# Patient Record
Sex: Female | Born: 1950 | Race: White | Hispanic: No | Smoking: Never smoker
Health system: Southern US, Community
[De-identification: ages and names within clinical notes are randomized; demographics above are authoritative.]

## PROBLEM LIST (undated history)

## (undated) DIAGNOSIS — G2581 Restless legs syndrome: Secondary | ICD-10-CM

## (undated) DIAGNOSIS — F419 Anxiety disorder, unspecified: Secondary | ICD-10-CM

## (undated) DIAGNOSIS — B353 Tinea pedis: Secondary | ICD-10-CM

## (undated) DIAGNOSIS — I8390 Asymptomatic varicose veins of unspecified lower extremity: Secondary | ICD-10-CM

## (undated) DIAGNOSIS — E782 Mixed hyperlipidemia: Secondary | ICD-10-CM

## (undated) DIAGNOSIS — E119 Type 2 diabetes mellitus without complications: Secondary | ICD-10-CM

## (undated) DIAGNOSIS — R569 Unspecified convulsions: Secondary | ICD-10-CM

## (undated) DIAGNOSIS — F339 Major depressive disorder, recurrent, unspecified: Secondary | ICD-10-CM

## (undated) DIAGNOSIS — K635 Polyp of colon: Secondary | ICD-10-CM

## (undated) DIAGNOSIS — G4700 Insomnia, unspecified: Secondary | ICD-10-CM

## (undated) HISTORY — DX: Tinea pedis: B35.3

## (undated) HISTORY — DX: Anxiety disorder, unspecified: F41.9

## (undated) HISTORY — DX: Major depressive disorder, recurrent, unspecified: F33.9

## (undated) HISTORY — DX: Mixed hyperlipidemia: E78.2

## (undated) HISTORY — DX: Restless legs syndrome: G25.81

## (undated) HISTORY — DX: Polyp of colon: K63.5

## (undated) HISTORY — DX: Insomnia, unspecified: G47.00

## (undated) HISTORY — DX: Asymptomatic varicose veins of unspecified lower extremity: I83.90

## (undated) HISTORY — PX: OTHER SURGICAL HISTORY: SHX169

## (undated) HISTORY — DX: Type 2 diabetes mellitus without complications: E11.9

## (undated) HISTORY — DX: Unspecified convulsions: R56.9

---

## 2008-11-24 LAB — HM MAMMOGRAPHY: HM Mammogram: NORMAL

## 2009-11-24 LAB — HM PAP SMEAR: HM Pap smear: NORMAL

## 2010-11-19 ENCOUNTER — Emergency Department: Payer: Self-pay | Admitting: Emergency Medicine

## 2012-05-30 ENCOUNTER — Emergency Department: Payer: Self-pay | Admitting: Emergency Medicine

## 2012-08-14 DIAGNOSIS — E785 Hyperlipidemia, unspecified: Secondary | ICD-10-CM | POA: Insufficient documentation

## 2012-11-13 DIAGNOSIS — E559 Vitamin D deficiency, unspecified: Secondary | ICD-10-CM | POA: Insufficient documentation

## 2012-11-29 DIAGNOSIS — K635 Polyp of colon: Secondary | ICD-10-CM

## 2012-11-29 HISTORY — PX: COLONOSCOPY: SHX5424

## 2012-11-29 HISTORY — DX: Polyp of colon: K63.5

## 2013-04-07 ENCOUNTER — Emergency Department: Payer: Self-pay | Admitting: Emergency Medicine

## 2013-04-07 LAB — PHENYTOIN LEVEL, TOTAL: Dilantin: 28.5 ug/mL — ABNORMAL HIGH (ref 10.0–20.0)

## 2013-04-07 LAB — URINALYSIS, COMPLETE
Bilirubin,UR: NEGATIVE
Blood: NEGATIVE
Glucose,UR: NEGATIVE mg/dL (ref 0–75)
Ketone: NEGATIVE
Nitrite: NEGATIVE
Ph: 6 (ref 4.5–8.0)
Protein: NEGATIVE
RBC,UR: 3 /HPF (ref 0–5)
Specific Gravity: 1.013 (ref 1.003–1.030)
Squamous Epithelial: 1
WBC UR: 2 /HPF (ref 0–5)

## 2013-04-07 LAB — COMPREHENSIVE METABOLIC PANEL
Alkaline Phosphatase: 154 U/L — ABNORMAL HIGH (ref 50–136)
BUN: 17 mg/dL (ref 7–18)
Bilirubin,Total: 0.2 mg/dL (ref 0.2–1.0)
Calcium, Total: 8.6 mg/dL (ref 8.5–10.1)
Creatinine: 0.71 mg/dL (ref 0.60–1.30)
EGFR (African American): >60
EGFR (Non-African Amer.): >60
Glucose: 119 mg/dL — ABNORMAL HIGH (ref 65–99)
Osmolality: 282 (ref 275–301)
Potassium: 4.1 mmol/L (ref 3.5–5.1)
SGOT(AST): 26 U/L (ref 15–37)
Sodium: 140 mmol/L (ref 136–145)

## 2013-04-07 LAB — CBC
HCT: 40.4 % (ref 35.0–47.0)
HGB: 13.8 g/dL (ref 12.0–16.0)
MCH: 30.8 pg (ref 26.0–34.0)
MCHC: 34.1 g/dL (ref 32.0–36.0)
MCV: 90 fL (ref 80–100)
Platelet: 235 10*3/uL (ref 150–440)
RBC: 4.47 10*6/uL (ref 3.80–5.20)
RDW: 13.6 % (ref 11.5–14.5)
WBC: 4.7 10*3/uL (ref 3.6–11.0)

## 2013-04-07 LAB — PHOSPHORUS: Phosphorus: 3.5 mg/dL (ref 2.5–4.9)

## 2013-04-07 LAB — CK TOTAL AND CKMB (NOT AT ARMC)
CK, Total: 68 U/L (ref 21–215)
CK-MB: 1 ng/mL (ref 0.5–3.6)

## 2013-04-21 ENCOUNTER — Emergency Department: Payer: Self-pay | Admitting: Emergency Medicine

## 2013-04-26 ENCOUNTER — Emergency Department: Payer: Self-pay | Admitting: Emergency Medicine

## 2013-04-27 LAB — CBC
HGB: 14.4 g/dL (ref 12.0–16.0)
MCH: 30.8 pg (ref 26.0–34.0)
MCHC: 34.3 g/dL (ref 32.0–36.0)
MCV: 90 fL (ref 80–100)
Platelet: 199 10*3/uL (ref 150–440)
RBC: 4.68 10*6/uL (ref 3.80–5.20)
RDW: 13.7 % (ref 11.5–14.5)
WBC: 4.6 10*3/uL (ref 3.6–11.0)

## 2013-04-27 LAB — URINALYSIS, COMPLETE
Bilirubin,UR: NEGATIVE
Glucose,UR: NEGATIVE mg/dL (ref 0–75)
Nitrite: NEGATIVE
Ph: 7 (ref 4.5–8.0)
Protein: NEGATIVE
RBC,UR: 1 /HPF (ref 0–5)
Specific Gravity: 1.016 (ref 1.003–1.030)
Squamous Epithelial: 2
WBC UR: 38 /HPF (ref 0–5)

## 2013-04-27 LAB — COMPREHENSIVE METABOLIC PANEL
Albumin: 3.9 g/dL (ref 3.4–5.0)
Alkaline Phosphatase: 145 U/L — ABNORMAL HIGH (ref 50–136)
Anion Gap: 5 — ABNORMAL LOW (ref 7–16)
BUN: 12 mg/dL (ref 7–18)
Bilirubin,Total: 0.2 mg/dL (ref 0.2–1.0)
Calcium, Total: 8.9 mg/dL (ref 8.5–10.1)
Chloride: 107 mmol/L (ref 98–107)
Co2: 31 mmol/L (ref 21–32)
EGFR (African American): >60
Glucose: 114 mg/dL — ABNORMAL HIGH (ref 65–99)
Osmolality: 286 (ref 275–301)
Potassium: 3.8 mmol/L (ref 3.5–5.1)
SGPT (ALT): 27 U/L (ref 12–78)
Sodium: 143 mmol/L (ref 136–145)
Total Protein: 6.9 g/dL (ref 6.4–8.2)

## 2013-04-27 LAB — PHENYTOIN LEVEL, TOTAL: Dilantin: 14 ug/mL (ref 10.0–20.0)

## 2013-06-05 ENCOUNTER — Emergency Department: Payer: Self-pay | Admitting: Emergency Medicine

## 2013-11-10 ENCOUNTER — Emergency Department: Payer: Self-pay | Admitting: Emergency Medicine

## 2013-11-10 LAB — BASIC METABOLIC PANEL
Chloride: 103 mmol/L (ref 98–107)
Co2: 29 mmol/L (ref 21–32)
Creatinine: 0.78 mg/dL (ref 0.60–1.30)
EGFR (African American): >60
EGFR (Non-African Amer.): >60
Osmolality: 277 (ref 275–301)
Potassium: 4.1 mmol/L (ref 3.5–5.1)
Sodium: 137 mmol/L (ref 136–145)

## 2013-11-10 LAB — CBC
MCHC: 34.8 g/dL (ref 32.0–36.0)
MCV: 90 fL (ref 80–100)
RBC: 4.73 10*6/uL (ref 3.80–5.20)
RDW: 13.3 % (ref 11.5–14.5)

## 2013-11-11 LAB — URINALYSIS, COMPLETE
Bilirubin,UR: NEGATIVE
Blood: NEGATIVE
Hyaline Cast: 2
Nitrite: NEGATIVE
Protein: 30
Squamous Epithelial: 6
Transitional Epi: 1
WBC UR: 24 /HPF (ref 0–5)

## 2014-11-25 ENCOUNTER — Emergency Department: Payer: Self-pay | Admitting: Emergency Medicine

## 2014-11-25 LAB — COMPREHENSIVE METABOLIC PANEL
AST: 15 U/L (ref 15–37)
Albumin: 3.5 g/dL (ref 3.4–5.0)
Alkaline Phosphatase: 141 U/L — ABNORMAL HIGH
Anion Gap: 6 — ABNORMAL LOW (ref 7–16)
BUN: 16 mg/dL (ref 7–18)
Bilirubin,Total: 0.1 mg/dL — ABNORMAL LOW (ref 0.2–1.0)
CREATININE: 0.74 mg/dL (ref 0.60–1.30)
Calcium, Total: 8.4 mg/dL — ABNORMAL LOW (ref 8.5–10.1)
Chloride: 106 mmol/L (ref 98–107)
Co2: 31 mmol/L (ref 21–32)
EGFR (Non-African Amer.): >60
Glucose: 133 mg/dL — ABNORMAL HIGH (ref 65–99)
OSMOLALITY: 288 (ref 275–301)
Potassium: 4 mmol/L (ref 3.5–5.1)
SGPT (ALT): 19 U/L
Sodium: 143 mmol/L (ref 136–145)
Total Protein: 6.8 g/dL (ref 6.4–8.2)

## 2014-11-25 LAB — URINALYSIS, COMPLETE
Bacteria: NONE SEEN
Bilirubin,UR: NEGATIVE
Glucose,UR: NEGATIVE mg/dL (ref 0–75)
KETONE: NEGATIVE
Nitrite: NEGATIVE
Ph: 8 (ref 4.5–8.0)
Protein: NEGATIVE
RBC,UR: 12 /HPF (ref 0–5)
SPECIFIC GRAVITY: 1.009 (ref 1.003–1.030)
Squamous Epithelial: 8
Transitional Epi: 4

## 2014-11-25 LAB — CBC
HCT: 42.3 % (ref 35.0–47.0)
HGB: 13.7 g/dL (ref 12.0–16.0)
MCH: 29.7 pg (ref 26.0–34.0)
MCHC: 32.3 g/dL (ref 32.0–36.0)
MCV: 92 fL (ref 80–100)
Platelet: 197 10*3/uL (ref 150–440)
RBC: 4.6 10*6/uL (ref 3.80–5.20)
RDW: 14.4 % (ref 11.5–14.5)
WBC: 5.3 10*3/uL (ref 3.6–11.0)

## 2014-11-25 LAB — LIPASE, BLOOD: Lipase: 155 U/L (ref 73–393)

## 2014-11-25 LAB — ACETAMINOPHEN LEVEL

## 2014-11-25 LAB — ETHANOL: Ethanol: <3 mg/dL

## 2014-11-25 LAB — PHENYTOIN LEVEL, TOTAL: DILANTIN: 7.2 ug/mL — AB (ref 10.0–20.0)

## 2014-11-25 LAB — SALICYLATE LEVEL: Salicylates, Serum: <1.7 mg/dL

## 2014-11-27 LAB — URINE CULTURE

## 2015-01-22 DIAGNOSIS — J069 Acute upper respiratory infection, unspecified: Secondary | ICD-10-CM | POA: Diagnosis not present

## 2015-01-22 DIAGNOSIS — R569 Unspecified convulsions: Secondary | ICD-10-CM | POA: Diagnosis not present

## 2015-01-22 DIAGNOSIS — Z86718 Personal history of other venous thrombosis and embolism: Secondary | ICD-10-CM | POA: Diagnosis not present

## 2015-01-22 DIAGNOSIS — E1140 Type 2 diabetes mellitus with diabetic neuropathy, unspecified: Secondary | ICD-10-CM | POA: Diagnosis not present

## 2015-01-22 DIAGNOSIS — R6889 Other general symptoms and signs: Secondary | ICD-10-CM | POA: Diagnosis not present

## 2015-01-22 DIAGNOSIS — Z23 Encounter for immunization: Secondary | ICD-10-CM | POA: Diagnosis not present

## 2015-01-22 DIAGNOSIS — G2581 Restless legs syndrome: Secondary | ICD-10-CM | POA: Diagnosis not present

## 2015-01-22 DIAGNOSIS — F339 Major depressive disorder, recurrent, unspecified: Secondary | ICD-10-CM | POA: Diagnosis not present

## 2015-01-22 DIAGNOSIS — B9789 Other viral agents as the cause of diseases classified elsewhere: Secondary | ICD-10-CM | POA: Diagnosis not present

## 2015-01-22 LAB — HEMOGLOBIN A1C: HEMOGLOBIN A1C: 6.7 % — AB (ref 4.0–6.0)

## 2015-01-31 DIAGNOSIS — E11620 Type 2 diabetes mellitus with diabetic dermatitis: Secondary | ICD-10-CM | POA: Diagnosis not present

## 2015-01-31 DIAGNOSIS — M2010 Hallux valgus (acquired), unspecified foot: Secondary | ICD-10-CM | POA: Diagnosis not present

## 2015-02-17 ENCOUNTER — Emergency Department: Payer: Self-pay | Admitting: Emergency Medicine

## 2015-02-17 DIAGNOSIS — E119 Type 2 diabetes mellitus without complications: Secondary | ICD-10-CM | POA: Diagnosis not present

## 2015-02-17 DIAGNOSIS — G40909 Epilepsy, unspecified, not intractable, without status epilepticus: Secondary | ICD-10-CM | POA: Diagnosis not present

## 2015-02-17 DIAGNOSIS — R4182 Altered mental status, unspecified: Secondary | ICD-10-CM | POA: Diagnosis not present

## 2015-02-25 DIAGNOSIS — E782 Mixed hyperlipidemia: Secondary | ICD-10-CM | POA: Diagnosis not present

## 2015-02-25 DIAGNOSIS — F339 Major depressive disorder, recurrent, unspecified: Secondary | ICD-10-CM | POA: Diagnosis not present

## 2015-02-25 DIAGNOSIS — N3000 Acute cystitis without hematuria: Secondary | ICD-10-CM | POA: Diagnosis not present

## 2015-02-25 DIAGNOSIS — E1140 Type 2 diabetes mellitus with diabetic neuropathy, unspecified: Secondary | ICD-10-CM | POA: Diagnosis not present

## 2015-02-25 DIAGNOSIS — R6889 Other general symptoms and signs: Secondary | ICD-10-CM | POA: Diagnosis not present

## 2015-02-25 DIAGNOSIS — Z1239 Encounter for other screening for malignant neoplasm of breast: Secondary | ICD-10-CM | POA: Diagnosis not present

## 2015-02-25 DIAGNOSIS — Z975 Presence of (intrauterine) contraceptive device: Secondary | ICD-10-CM | POA: Diagnosis not present

## 2015-02-25 DIAGNOSIS — R569 Unspecified convulsions: Secondary | ICD-10-CM | POA: Diagnosis not present

## 2015-03-06 DIAGNOSIS — R569 Unspecified convulsions: Secondary | ICD-10-CM | POA: Diagnosis not present

## 2015-03-06 DIAGNOSIS — R252 Cramp and spasm: Secondary | ICD-10-CM | POA: Diagnosis not present

## 2015-03-13 DIAGNOSIS — B353 Tinea pedis: Secondary | ICD-10-CM | POA: Diagnosis not present

## 2015-03-13 DIAGNOSIS — D487 Neoplasm of uncertain behavior of other specified sites: Secondary | ICD-10-CM | POA: Diagnosis not present

## 2015-03-13 DIAGNOSIS — I868 Varicose veins of other specified sites: Secondary | ICD-10-CM | POA: Diagnosis not present

## 2015-03-18 DIAGNOSIS — H40033 Anatomical narrow angle, bilateral: Secondary | ICD-10-CM | POA: Diagnosis not present

## 2015-03-18 DIAGNOSIS — E119 Type 2 diabetes mellitus without complications: Secondary | ICD-10-CM | POA: Diagnosis not present

## 2015-03-18 LAB — HM DIABETES EYE EXAM

## 2015-03-19 LAB — HM DIABETES EYE EXAM

## 2015-03-27 ENCOUNTER — Emergency Department
Admit: 2015-03-27 | Disposition: A | Discharge status: Home / Self Care | Payer: Self-pay | Admitting: Emergency Medicine

## 2015-03-27 DIAGNOSIS — M25571 Pain in right ankle and joints of right foot: Secondary | ICD-10-CM | POA: Diagnosis not present

## 2015-03-27 DIAGNOSIS — M7989 Other specified soft tissue disorders: Secondary | ICD-10-CM | POA: Diagnosis not present

## 2015-03-27 DIAGNOSIS — M1711 Unilateral primary osteoarthritis, right knee: Secondary | ICD-10-CM | POA: Diagnosis not present

## 2015-03-27 DIAGNOSIS — E119 Type 2 diabetes mellitus without complications: Secondary | ICD-10-CM | POA: Diagnosis not present

## 2015-03-27 DIAGNOSIS — M19071 Primary osteoarthritis, right ankle and foot: Secondary | ICD-10-CM | POA: Diagnosis not present

## 2015-03-28 DIAGNOSIS — R6889 Other general symptoms and signs: Secondary | ICD-10-CM | POA: Diagnosis not present

## 2015-03-28 DIAGNOSIS — F329 Major depressive disorder, single episode, unspecified: Secondary | ICD-10-CM | POA: Diagnosis not present

## 2015-03-28 DIAGNOSIS — G4700 Insomnia, unspecified: Secondary | ICD-10-CM | POA: Diagnosis not present

## 2015-04-03 DIAGNOSIS — R569 Unspecified convulsions: Secondary | ICD-10-CM | POA: Diagnosis not present

## 2015-04-03 DIAGNOSIS — R252 Cramp and spasm: Secondary | ICD-10-CM | POA: Diagnosis not present

## 2015-04-16 DIAGNOSIS — Z975 Presence of (intrauterine) contraceptive device: Secondary | ICD-10-CM | POA: Diagnosis not present

## 2015-04-16 DIAGNOSIS — N951 Menopausal and female climacteric states: Secondary | ICD-10-CM | POA: Diagnosis not present

## 2015-04-17 ENCOUNTER — Emergency Department
Admit: 2015-04-17 | Disposition: A | Discharge status: Home / Self Care | Payer: Self-pay | Admitting: Emergency Medicine

## 2015-04-17 DIAGNOSIS — Z7952 Long term (current) use of systemic steroids: Secondary | ICD-10-CM | POA: Diagnosis not present

## 2015-04-17 DIAGNOSIS — R748 Abnormal levels of other serum enzymes: Secondary | ICD-10-CM | POA: Diagnosis not present

## 2015-04-17 DIAGNOSIS — N390 Urinary tract infection, site not specified: Secondary | ICD-10-CM | POA: Diagnosis not present

## 2015-04-17 DIAGNOSIS — Z7982 Long term (current) use of aspirin: Secondary | ICD-10-CM | POA: Diagnosis not present

## 2015-04-17 DIAGNOSIS — Z791 Long term (current) use of non-steroidal anti-inflammatories (NSAID): Secondary | ICD-10-CM | POA: Diagnosis not present

## 2015-04-17 DIAGNOSIS — G40909 Epilepsy, unspecified, not intractable, without status epilepticus: Secondary | ICD-10-CM | POA: Diagnosis not present

## 2015-04-17 DIAGNOSIS — Z79899 Other long term (current) drug therapy: Secondary | ICD-10-CM | POA: Diagnosis not present

## 2015-04-17 LAB — URINALYSIS, COMPLETE
BILIRUBIN, UR: NEGATIVE
GLUCOSE, UR: NEGATIVE mg/dL (ref 0–75)
Ketone: NEGATIVE
Nitrite: NEGATIVE
PH: 8 (ref 4.5–8.0)
Protein: NEGATIVE
Specific Gravity: 1.005 (ref 1.003–1.030)

## 2015-04-17 LAB — COMPREHENSIVE METABOLIC PANEL
ALT: 14 U/L
AST: 20 U/L
Albumin: 4.4 g/dL
Alkaline Phosphatase: 108 U/L
Anion Gap: 6 — ABNORMAL LOW (ref 7–16)
BUN: 17 mg/dL
Bilirubin,Total: 0.2 mg/dL — ABNORMAL LOW
CHLORIDE: 107 mmol/L
CO2: 29 mmol/L
Calcium, Total: 9.2 mg/dL
Creatinine: 0.79 mg/dL
EGFR (African American): >60
Glucose: 149 mg/dL — ABNORMAL HIGH
POTASSIUM: 3.8 mmol/L
Sodium: 142 mmol/L
Total Protein: 7.1 g/dL

## 2015-04-17 LAB — CBC
HCT: 41.1 % (ref 35.0–47.0)
HGB: 13.9 g/dL (ref 12.0–16.0)
MCH: 30 pg (ref 26.0–34.0)
MCHC: 33.8 g/dL (ref 32.0–36.0)
MCV: 89 fL (ref 80–100)
Platelet: 196 10*3/uL (ref 150–440)
RBC: 4.62 10*6/uL (ref 3.80–5.20)
RDW: 14.1 % (ref 11.5–14.5)
WBC: 4.6 10*3/uL (ref 3.6–11.0)

## 2015-04-17 LAB — PHENYTOIN LEVEL, TOTAL: Dilantin: 5.3 ug/mL — ABNORMAL LOW

## 2015-04-17 LAB — TROPONIN I: Troponin-I: <0.03 ng/mL

## 2015-04-22 DIAGNOSIS — Z87898 Personal history of other specified conditions: Secondary | ICD-10-CM | POA: Diagnosis not present

## 2015-05-21 DIAGNOSIS — R569 Unspecified convulsions: Secondary | ICD-10-CM | POA: Diagnosis not present

## 2015-05-21 DIAGNOSIS — R252 Cramp and spasm: Secondary | ICD-10-CM | POA: Diagnosis not present

## 2015-10-24 ENCOUNTER — Ambulatory Visit (INDEPENDENT_AMBULATORY_CARE_PROVIDER_SITE_OTHER): Payer: Medicare Other | Admitting: Family Medicine

## 2015-10-24 ENCOUNTER — Encounter: Payer: Self-pay | Admitting: *Deleted

## 2015-10-24 VITALS — BP 127/78 | HR 97 | Temp 97.8°F | Resp 16 | Ht 65.5 in | Wt 201.0 lb

## 2015-10-24 DIAGNOSIS — G2581 Restless legs syndrome: Secondary | ICD-10-CM | POA: Insufficient documentation

## 2015-10-24 DIAGNOSIS — Z23 Encounter for immunization: Secondary | ICD-10-CM

## 2015-10-24 DIAGNOSIS — E119 Type 2 diabetes mellitus without complications: Secondary | ICD-10-CM

## 2015-10-24 DIAGNOSIS — G4700 Insomnia, unspecified: Secondary | ICD-10-CM | POA: Diagnosis not present

## 2015-10-24 DIAGNOSIS — E1142 Type 2 diabetes mellitus with diabetic polyneuropathy: Secondary | ICD-10-CM | POA: Insufficient documentation

## 2015-10-24 DIAGNOSIS — R569 Unspecified convulsions: Secondary | ICD-10-CM | POA: Diagnosis not present

## 2015-10-24 DIAGNOSIS — I8390 Asymptomatic varicose veins of unspecified lower extremity: Secondary | ICD-10-CM | POA: Insufficient documentation

## 2015-10-24 DIAGNOSIS — E782 Mixed hyperlipidemia: Secondary | ICD-10-CM

## 2015-10-24 DIAGNOSIS — I868 Varicose veins of other specified sites: Secondary | ICD-10-CM

## 2015-10-24 DIAGNOSIS — F339 Major depressive disorder, recurrent, unspecified: Secondary | ICD-10-CM | POA: Diagnosis not present

## 2015-10-24 DIAGNOSIS — B353 Tinea pedis: Secondary | ICD-10-CM | POA: Diagnosis not present

## 2015-10-24 MED ORDER — GABAPENTIN 100 MG PO CAPS: ORAL_CAPSULE | ORAL | Status: DC

## 2015-10-24 NOTE — Progress Notes (Signed)
Name: Kaylee Wolf   MRN: [id number]    DOB: 04-16-1951   Date:10/24/2015       Progress Note  Subjective  Chief Complaint  Chief Complaint  Patient presents with  . toe numbness    HPI  Here c/o toes being numb.  Noted for a few months.  Getting worse.  Also with stinging, burning, tingling.  R>L.   BSs at home are occ. Checked, but she does not know numbers.    No problem-specific assessment & plan notes found for this encounter.   Past Medical History  Diagnosis Date  . Type 2 diabetes mellitus ([place])   . Mixed hyperlipidemia   . Recurrent major depressive disorder ([place])   . Varicose veins   . Seizures ([place])   . Tinea pedis of both feet   . RLS (restless legs syndrome)   . Insomnia     Social History  Substance Use Topics  . Smoking status: Never Smoker   . Smokeless tobacco: Never Used  . Alcohol Use: No     Current outpatient prescriptions:  .  aspirin 81 MG tablet, Take 81 mg by mouth daily., Disp: , Rfl:  .  Cholecalciferol (VITAMIN D-3) 1000 UNITS CAPS, Take by mouth., Disp: , Rfl:  .  clotrimazole (LOTRIMIN) 1 % cream, Apply 1 application topically 2 (two) times daily., Disp: , Rfl:  .  gabapentin (NEURONTIN) 100 MG capsule, Take 100 mg by mouth 2 (two) times daily., Disp: , Rfl:  .  lamoTRIgine (LAMICTAL) 25 MG tablet, Take 25 mg by mouth daily. As directed, Disp: , Rfl:  .  lovastatin (MEVACOR) 20 MG tablet, Take 20 mg by mouth at bedtime., Disp: , Rfl:  .  meloxicam (MOBIC) 15 MG tablet, Take 15 mg by mouth daily., Disp: , Rfl:  .  metFORMIN (GLUCOPHAGE) 500 MG tablet, Take 500 mg by mouth 2 (two) times daily with a meal., Disp: , Rfl:  .  sertraline (ZOLOFT) 50 MG tablet, Take 50 mg by mouth daily., Disp: , Rfl:   No Known Allergies  Review of Systems  Constitutional: Negative for fever, chills, weight loss and malaise/fatigue.  HENT: Negative for hearing loss.   Eyes: Negative for blurred vision and double vision.  Respiratory: Negative for  cough, shortness of breath and wheezing.   Cardiovascular: Negative for chest pain, palpitations, orthopnea and leg swelling.  Gastrointestinal: Negative for heartburn, abdominal pain and blood in stool.  Genitourinary: Negative for dysuria, urgency and frequency.  Musculoskeletal: Negative for myalgias and joint pain.  Skin: Negative for rash.  Neurological: Positive for tingling and sensory change. Negative for weakness and headaches.      Objective  Filed Vitals:   10/24/15 0813  BP: 127/78  Pulse: 97  Temp: 97.8 F (36.6 C)  TempSrc: Oral  Resp: 16  Height: 5' 5.5" (1.664 m)  Weight: 201 lb (91.173 kg)     Physical Exam  Constitutional: She is oriented to person, place, and time and well-developed, well-nourished, and in no distress.  HENT:  Head: Normocephalic and atraumatic.  Eyes: Conjunctivae and EOM are normal. Pupils are equal, round, and reactive to light. No scleral icterus.  Neck: Normal range of motion. Neck supple. Carotid bruit is not present. No thyromegaly present.  Cardiovascular: Normal rate, regular rhythm, normal heart sounds and intact distal pulses.  Exam reveals no gallop and no friction rub.   No murmur heard. Pulmonary/Chest: Effort normal and breath sounds normal. No respiratory distress. She has no  wheezes. She has no rales.  Musculoskeletal: She exhibits no edema.  Lymphadenopathy:    She has no cervical adenopathy.  Neurological: She is alert and oriented to person, place, and time.  Patient reports numbnewss/tingling of both feet to ankles and to hip on R. Leg.  No skin changes.  Good distal pulses.    Vitals reviewed.     No results found for this or any previous visit (from the past 2160 hour(s)).   Assessment & Plan  1. Diabetic peripheral neuropathy (HCC)  - CBC with Differential - gabapentin (NEURONTIN) 100 MG capsule; Take 2 caps twice a day and 3 at bedtime.  Dispense: 210 capsule; Refill: 6  2. Type 2 diabetes mellitus  without complication, without long-term current use of insulin (HCC)  - Comprehensive Metabolic Panel (CMET) - HgB A1c  3. Recurrent major depressive disorder, remission status unspecified ([place])   4. Mixed hyperlipidemia  - Lipid Profile  5. Varicose veins   6. Seizures ([place])   7. Tinea pedis of both feet   8. Insomnia   9. RLS (restless legs syndrome)   10. Need for influenza vaccination  - Flu Vaccine QUAD 36+ mos PF IM (Fluarix & Fluzone Quad PF)

## 2015-11-25 ENCOUNTER — Encounter: Payer: Self-pay | Admitting: Family Medicine

## 2015-11-25 ENCOUNTER — Ambulatory Visit (INDEPENDENT_AMBULATORY_CARE_PROVIDER_SITE_OTHER): Payer: Medicare Other | Admitting: Family Medicine

## 2015-11-25 VITALS — BP 116/72 | HR 91 | Temp 98.1°F | Resp 16 | Ht 65.5 in | Wt 198.8 lb

## 2015-11-25 DIAGNOSIS — Z1211 Encounter for screening for malignant neoplasm of colon: Secondary | ICD-10-CM

## 2015-11-25 DIAGNOSIS — E782 Mixed hyperlipidemia: Secondary | ICD-10-CM

## 2015-11-25 DIAGNOSIS — R208 Other disturbances of skin sensation: Secondary | ICD-10-CM | POA: Diagnosis not present

## 2015-11-25 DIAGNOSIS — E1142 Type 2 diabetes mellitus with diabetic polyneuropathy: Secondary | ICD-10-CM | POA: Diagnosis not present

## 2015-11-25 DIAGNOSIS — G2581 Restless legs syndrome: Secondary | ICD-10-CM | POA: Diagnosis not present

## 2015-11-25 DIAGNOSIS — R200 Anesthesia of skin: Secondary | ICD-10-CM

## 2015-11-25 LAB — POCT GLYCOSYLATED HEMOGLOBIN (HGB A1C): HEMOGLOBIN A1C: 6.7

## 2015-11-25 MED ORDER — GABAPENTIN 100 MG PO CAPS: ORAL_CAPSULE | ORAL | Status: DC

## 2015-11-25 MED ORDER — LISINOPRIL 2.5 MG PO TABS: 2.5000 mg | ORAL_TABLET | Freq: Every day | ORAL | Status: DC

## 2015-11-25 MED ORDER — BLOOD GLUCOSE MONITOR KIT: PACK | Status: AC

## 2015-11-25 NOTE — Assessment & Plan Note (Addendum)
A1c is stable. Continue metformin. Check CMP. Increase gaba to 300mg  TID. Consider alpha lipoic acid.   Start ACE for renal protection. Microalbumin pending. Eye exam UTD Foot exam done.  RTC 1 mos to monitor neuropathy.

## 2015-11-25 NOTE — Assessment & Plan Note (Signed)
Controlled with requip.

## 2015-11-25 NOTE — Patient Instructions (Addendum)
Gabapentin: Take 3 pills in the AM, 2 pills in afternoon, and 3 pills at bedtime for 1 week. Then increase to 3 pills in the AM, 3 pills in the afternoon, and 3 pills at bedtime.    Keep up the good work with your diabetes!    Let's check in in about 1 month to see how your legs are doing.

## 2015-11-25 NOTE — Assessment & Plan Note (Signed)
Check lipid panel, continue lovastatin. 

## 2015-11-25 NOTE — Progress Notes (Signed)
Subjective:    Patient ID: Kaylee Wolf, female    DOB: Dec 18, 1951, 64 y.o.   MRN: [id number]  HPI: Kaylee Wolf is a 64 y.o. female presenting on 11/25/2015 for Diabetes   HPI  Pt presents for diabetes follow-up.  Taking metformin twice daily.  Needs new BG monitor. Doing well at home. But is having issues with her feet. Started on gabapentin last month- some improvement but still burning and tingling. Numbness is worse at night. Burns up the legs. Worse when not wearing shoes.  Hyperlipidemia: Takes lovastatin 20. No leg cramps. No chest pain no shortness of breath.  Desires cologuard for colon cancer screening.  RLS: Controlled with requip.    Past Medical History  Diagnosis Date  . Type 2 diabetes mellitus ([place])   . Mixed hyperlipidemia   . Recurrent major depressive disorder ([place])   . Varicose veins   . Seizures ([place])   . Tinea pedis of both feet   . RLS (restless legs syndrome)   . Insomnia     Current Outpatient Prescriptions on File Prior to Visit  Medication Sig  . aspirin 81 MG tablet Take 81 mg by mouth daily.  . Cholecalciferol (VITAMIN D-3) 1000 UNITS CAPS Take by mouth.  . clotrimazole (LOTRIMIN) 1 % cream Apply 1 application topically 2 (two) times daily.  [name] lovastatin (MEVACOR) 20 MG tablet Take 20 mg by mouth at bedtime.  . meloxicam (MOBIC) 15 MG tablet Take 15 mg by mouth daily.  . metFORMIN (GLUCOPHAGE) 500 MG tablet Take 500 mg by mouth 2 (two) times daily with a meal.  . sertraline (ZOLOFT) 50 MG tablet Take 50 mg by mouth daily.   No current facility-administered medications on file prior to visit.    Review of Systems  Constitutional: Negative for fever and chills.  HENT: Negative.   Respiratory: Negative for shortness of breath.   Cardiovascular: Negative for chest pain, palpitations and leg swelling.  Gastrointestinal: Negative for abdominal pain and abdominal distention.  Endocrine: Negative for cold intolerance, heat intolerance,  polydipsia, polyphagia and polyuria.  Genitourinary: Negative.   Musculoskeletal: Negative.   Neurological: Positive for numbness (toes). Negative for dizziness and headaches.  Psychiatric/Behavioral: Negative.    Per HPI unless specifically indicated above     Objective:    BP 116/72 mmHg  Pulse 91  Temp(Src) 98.1 F (36.7 C) (Oral)  Resp 16  Ht 5' 5.5" (1.664 m)  Wt 198 lb 12.8 oz (90.175 kg)  BMI 32.57 kg/m2  Wt Readings from Last 3 Encounters:  11/25/15 198 lb 12.8 oz (90.175 kg)  10/24/15 201 lb (91.173 kg)    Physical Exam  Constitutional: She is oriented to person, place, and time. She appears well-developed and well-nourished. No distress.  Neck: Normal range of motion. Neck supple. No thyromegaly present.  Cardiovascular: Normal rate and regular rhythm.  Exam reveals no gallop and no friction rub.   No murmur heard. Pulmonary/Chest: Effort normal and breath sounds normal.  Abdominal: Soft. Bowel sounds are normal. There is no tenderness. There is no rebound.  Musculoskeletal: Normal range of motion. She exhibits no edema or tenderness.  Lymphadenopathy:    She has no cervical adenopathy.  Neurological: She is alert and oriented to person, place, and time.  Skin: Skin is warm and dry. She is not diaphoretic.   Diabetic Foot Exam - Simple   Simple Foot Form  Diabetic Foot exam was performed with the following findings:  Yes 11/25/2015  9:01  AM  Visual Inspection  No deformities, no ulcerations, no other skin breakdown bilaterally:  Yes  Sensation Testing  Intact to touch and monofilament testing bilaterally:  Yes  Pulse Check  Posterior Tibialis and Dorsalis pulse intact bilaterally:  Yes  Comments      Results for orders placed or performed in visit on 11/25/15  POCT HgB A1C  Result Value Ref Range   Hemoglobin A1C 6.7       Assessment & Plan:   Problem List Items Addressed This Visit      Endocrine   Type 2 DM with diabetic neuropathy affecting  both sides of body ([place]) - Primary    A1c is stable. Continue metformin. Check CMP. Increase gaba to 355m TID. Consider alpha lipoic acid.   Start ACE for renal protection. Microalbumin pending. Eye exam UTD Foot exam done.  RTC 1 mos to monitor neuropathy.       Relevant Medications   blood glucose meter kit and supplies KIT   lisinopril (PRINIVIL,ZESTRIL) 2.5 MG tablet   gabapentin (NEURONTIN) 100 MG capsule   Other Relevant Orders   POCT HgB A1C (Completed)   Lipid Profile   Comprehensive Metabolic Panel (CMET)   Urine Microalbumin w/creat. ratio     Other   Mixed hyperlipidemia    Check lipid panel, continue lovastatin.       Relevant Medications   lisinopril (PRINIVIL,ZESTRIL) 2.5 MG tablet   RLS (restless legs syndrome)    Controlled with requip.        Other Visit Diagnoses    Screening for colon cancer        Cologuard ordered.     Relevant Orders    Cologuard    Numbness of toes        Check B12 to see if it is exacerbating diabetic neuropathy.     Relevant Orders    Vitamin B12    TSH       Meds ordered this encounter  Medications  . phenytoin (DILANTIN) 200 MG ER capsule    Sig: Take by mouth.  . lamoTRIgine (LAMICTAL) 100 MG tablet    Sig: Take by mouth.  .Marland KitchenrOPINIRole (REQUIP) 0.25 MG tablet    Sig: TAKE TWO TABLETS BY MOUTH AT NIGHT FOR RESTLESS LEG  . blood glucose meter kit and supplies KIT    Sig: Dispense based on patient and insurance preference. Check blood glucose once daily.    Dispense:  1 each    Refill:  0    Order Specific Question:  Supervising Provider    Answer:  HArlis Porta[[[id number]   Order Specific Question:  Number of strips    Answer:  30    Order Specific Question:  Number of lancets    Answer:  30  . lisinopril (PRINIVIL,ZESTRIL) 2.5 MG tablet    Sig: Take 1 tablet (2.5 mg total) by mouth daily.    Dispense:  90 tablet    Refill:  3    Order Specific Question:  Supervising Provider    Answer:  HArlis Porta[[phone] . gabapentin (NEURONTIN) 100 MG capsule    Sig: Take 2 caps twice a day and 3 at bedtime.    Dispense:  270 capsule    Refill:  6    Order Specific Question:  Supervising Provider    Answer:  HArlis Porta[[[id number]     Follow up plan: Return in about 4 weeks (  around 12/23/2015) for Diabetic neuropathy.[name]

## 2015-12-22 ENCOUNTER — Emergency Department
Admission: EM | Admit: 2015-12-22 | Discharge: 2015-12-23 | Disposition: A | Discharge status: Home / Self Care | Payer: Medicare Other | Attending: Emergency Medicine | Admitting: Emergency Medicine

## 2015-12-22 ENCOUNTER — Emergency Department: Payer: Medicare Other

## 2015-12-22 ENCOUNTER — Encounter: Payer: Self-pay | Admitting: Urgent Care

## 2015-12-22 DIAGNOSIS — Z792 Long term (current) use of antibiotics: Secondary | ICD-10-CM | POA: Diagnosis not present

## 2015-12-22 DIAGNOSIS — Z791 Long term (current) use of non-steroidal anti-inflammatories (NSAID): Secondary | ICD-10-CM | POA: Insufficient documentation

## 2015-12-22 DIAGNOSIS — J159 Unspecified bacterial pneumonia: Secondary | ICD-10-CM | POA: Diagnosis not present

## 2015-12-22 DIAGNOSIS — R000 Tachycardia, unspecified: Secondary | ICD-10-CM | POA: Diagnosis not present

## 2015-12-22 DIAGNOSIS — Z79899 Other long term (current) drug therapy: Secondary | ICD-10-CM | POA: Diagnosis not present

## 2015-12-22 DIAGNOSIS — Z7984 Long term (current) use of oral hypoglycemic drugs: Secondary | ICD-10-CM | POA: Insufficient documentation

## 2015-12-22 DIAGNOSIS — J189 Pneumonia, unspecified organism: Secondary | ICD-10-CM

## 2015-12-22 DIAGNOSIS — Z7982 Long term (current) use of aspirin: Secondary | ICD-10-CM | POA: Diagnosis not present

## 2015-12-22 DIAGNOSIS — E1140 Type 2 diabetes mellitus with diabetic neuropathy, unspecified: Secondary | ICD-10-CM | POA: Diagnosis not present

## 2015-12-22 DIAGNOSIS — R05 Cough: Secondary | ICD-10-CM | POA: Diagnosis present

## 2015-12-22 DIAGNOSIS — B349 Viral infection, unspecified: Secondary | ICD-10-CM | POA: Insufficient documentation

## 2015-12-22 LAB — BASIC METABOLIC PANEL
Anion gap: 7 (ref 5–15)
BUN: 15 mg/dL (ref 6–20)
CALCIUM: 8.8 mg/dL — AB (ref 8.9–10.3)
CO2: 27 mmol/L (ref 22–32)
CREATININE: 0.67 mg/dL (ref 0.44–1.00)
Chloride: 104 mmol/L (ref 101–111)
GFR calc Af Amer: >60 mL/min (ref >60)
GLUCOSE: 201 mg/dL — AB (ref 65–99)
Potassium: 3.7 mmol/L (ref 3.5–5.1)
Sodium: 138 mmol/L (ref 135–145)

## 2015-12-22 LAB — CBC
HCT: 41.2 % (ref 35.0–47.0)
Hemoglobin: 13.7 g/dL (ref 12.0–16.0)
MCH: 28.7 pg (ref 26.0–34.0)
MCHC: 33.2 g/dL (ref 32.0–36.0)
MCV: 86.4 fL (ref 80.0–100.0)
PLATELETS: 200 10*3/uL (ref 150–440)
RBC: 4.77 MIL/uL (ref 3.80–5.20)
RDW: 14.2 % (ref 11.5–14.5)
WBC: 5 10*3/uL (ref 3.6–11.0)

## 2015-12-22 MED ORDER — ONDANSETRON HCL 4 MG/2ML IJ SOLN
INTRAMUSCULAR | Status: AC
  Administered 2015-12-22: 4 mg via INTRAVENOUS
  Filled 2015-12-22: qty 2

## 2015-12-22 MED ORDER — DOXYCYCLINE HYCLATE 100 MG PO TABS
100.0000 mg | ORAL_TABLET | Freq: Once | ORAL | Status: AC
  Administered 2015-12-22: 100 mg via ORAL
  Filled 2015-12-22: qty 1

## 2015-12-22 MED ORDER — ONDANSETRON HCL 4 MG PO TABS: ORAL_TABLET | ORAL | Status: DC

## 2015-12-22 MED ORDER — ONDANSETRON HCL 4 MG/2ML IJ SOLN
4.0000 mg | Freq: Once | INTRAMUSCULAR | Status: AC
  Administered 2015-12-22: 4 mg via INTRAVENOUS

## 2015-12-22 MED ORDER — DOXYCYCLINE HYCLATE 100 MG PO CAPS: ORAL_CAPSULE | ORAL | Status: DC

## 2015-12-22 MED ORDER — SODIUM CHLORIDE 0.9 % IV BOLUS (SEPSIS)
1000.0000 mL | Freq: Once | INTRAVENOUS | Status: AC
  Administered 2015-12-22: 1000 mL via INTRAVENOUS

## 2015-12-22 NOTE — Discharge Instructions (Signed)
As we discussed, we believe that you do have a viral syndrome (possibly influenza) that is most likely causing her symptoms.  We did not test for the influenza since she had the symptoms for 3 days and there is nothing that we can do to treat you any differently.  However, your chest x-ray suggested you may also have a small amount of community-acquired pneumonia.  For this we have prescribed an antibiotic called doxycycline which should not affect your seizure disorder.  These take the full course of antibiotics as written (twice a day for 10 days).  We also wrote you a prescription for nausea medicine if you continue to be sick to your stomach.  Follow-up with your regular primary care provider at the end of this week if possible.  Return to the emergency department with new or worsening symptoms that concern you.   Community-Acquired Pneumonia, Adult Pneumonia is an infection of the lungs. There are different types of pneumonia. One type can develop while a person is in a hospital. A different type, called community-acquired pneumonia, develops in people who are not, or have not recently been, in the hospital or other health care facility.  CAUSES Pneumonia may be caused by bacteria, viruses, or funguses. Community-acquired pneumonia is often caused by Streptococcus pneumonia bacteria. These bacteria are often passed from one person to another by breathing in droplets from the cough or sneeze of an infected person. RISK FACTORS The condition is more likely to develop in:  People who havechronic diseases, such as chronic obstructive pulmonary disease (COPD), asthma, congestive heart failure, cystic fibrosis, diabetes, or kidney disease.  People who haveearly-stage or late-stage HIV.  People who havesickle cell disease.  People who havehad their spleen removed (splenectomy).  People who havepoor Human resources officer.  People who havemedical conditions that increase the risk of breathing in  (aspirating) secretions their own mouth and nose.   People who havea weakened immune system (immunocompromised).  People who smoke.  People whotravel to areas where pneumonia-causing germs commonly exist.  People whoare around animal habitats or animals that have pneumonia-causing germs, including birds, bats, rabbits, cats, and farm animals. SYMPTOMS Symptoms of this condition include:  Adry cough.  A wet (productive) cough.  Fever.  Sweating.  Chest pain, especially when breathing deeply or coughing.  Rapid breathing or difficulty breathing.  Shortness of breath.  Shaking chills.  Fatigue.  Muscle aches. DIAGNOSIS Your health care provider will take a medical history and perform a physical exam. You may also have other tests, including:  Imaging studies of your chest, including X-rays.  Tests to check your blood oxygen level and other blood gases.  Other tests on blood, mucus (sputum), fluid around your lungs (pleural fluid), and urine. If your pneumonia is severe, other tests may be done to identify the specific cause of your illness. TREATMENT The type of treatment that you receive depends on many factors, such as the cause of your pneumonia, the medicines you take, and other medical conditions that you have. For most adults, treatment and recovery from pneumonia may occur at home. In some cases, treatment must happen in a hospital. Treatment may include:  Antibiotic medicines, if the pneumonia was caused by bacteria.  Antiviral medicines, if the pneumonia was caused by a virus.  Medicines that are given by mouth or through an IV tube.  Oxygen.  Respiratory therapy. Although rare, treating severe pneumonia may include:  Mechanical ventilation. This is done if you are not breathing well on your  own and you cannot maintain a safe blood oxygen level.  Thoracentesis. This procedureremoves fluid around one lung or both lungs to help you breathe  better. HOME CARE INSTRUCTIONS  Take over-the-counter and prescription medicines only as told by your health care provider.  Only takecough medicine if you are losing sleep. Understand that cough medicine can prevent your body's natural ability to remove mucus from your lungs.  If you were prescribed an antibiotic medicine, take it as told by your health care provider. Do not stop taking the antibiotic even if you start to feel better.  Sleep in a semi-upright position at night. Try sleeping in a reclining chair, or place a few pillows under your head.  Do not use tobacco products, including cigarettes, chewing tobacco, and e-cigarettes. If you need help quitting, ask your health care provider.  Drink enough water to keep your urine clear or pale yellow. This will help to thin out mucus secretions in your lungs. PREVENTION There are ways that you can decrease your risk of developing community-acquired pneumonia. Consider getting a pneumococcal vaccine if:  You are older than 62 years of age.  You are older than 62 years of age and are undergoing cancer treatment, have chronic lung disease, or have other medical conditions that affect your immune system. Ask your health care provider if this applies to you. There are different types and schedules of pneumococcal vaccines. Ask your health care provider which vaccination option is best for you. You may also prevent community-acquired pneumonia if you take these actions:  Get an influenza vaccine every year. Ask your health care provider which type of influenza vaccine is best for you.  Go to the dentist on a regular basis.  Wash your hands often. Use hand sanitizer if soap and water are not available. SEEK MEDICAL CARE IF:  You have a fever.  You are losing sleep because you cannot control your cough with cough medicine. SEEK IMMEDIATE MEDICAL CARE IF:  You have worsening shortness of breath.  You have increased chest pain.  Your  sickness becomes worse, especially if you are an older adult or have a weakened immune system.  You cough up blood.   This information is not intended to replace advice given to you by your health care provider. Make sure you discuss any questions you have with your health care provider.   Document Released: 12/13/2005 Document Revised: 09/03/2015 Document Reviewed: 04/09/2015 Elsevier Interactive Patient Education 2016 [employer]  Viral Infections A viral infection can be caused by different types of viruses.Most viral infections are not serious and resolve on their own. However, some infections may cause severe symptoms and may lead to further complications. SYMPTOMS Viruses can frequently cause:  Minor sore throat.  Aches and pains.  Headaches.  Runny nose.  Different types of rashes.  Watery eyes.  Tiredness.  Cough.  Loss of appetite.  Gastrointestinal infections, resulting in nausea, vomiting, and diarrhea. These symptoms do not respond to antibiotics because the infection is not caused by bacteria. However, you might catch a bacterial infection following the viral infection. This is sometimes called a "superinfection." Symptoms of such a bacterial infection may include:  Worsening sore throat with pus and difficulty swallowing.  Swollen neck glands.  Chills and a high or persistent fever.  Severe headache.  Tenderness over the sinuses.  Persistent overall ill feeling (malaise), muscle aches, and tiredness (fatigue).  Persistent cough.  Yellow, green, or brown mucus production with coughing. HOME CARE INSTRUCTIONS  Only take over-the-counter or prescription medicines for pain, discomfort, diarrhea, or fever as directed by your caregiver.  Drink enough water and fluids to keep your urine clear or pale yellow. Sports drinks can provide valuable electrolytes, sugars, and hydration.  Get plenty of rest and maintain proper nutrition. Soups and broths  with crackers or rice are fine. SEEK IMMEDIATE MEDICAL CARE IF:   You have severe headaches, shortness of breath, chest pain, neck pain, or an unusual rash.  You have uncontrolled vomiting, diarrhea, or you are unable to keep down fluids.  You or your child has an oral temperature above 102 F (38.9 C), not controlled by medicine.  Your baby is older than 3 months with a rectal temperature of 102 F (38.9 C) or higher.  Your baby is 58 months old or younger with a rectal temperature of 100.4 F (38 C) or higher. MAKE SURE YOU:   Understand these instructions.  Will watch your condition.  Will get help right away if you are not doing well or get worse.   This information is not intended to replace advice given to you by your health care provider. Make sure you discuss any questions you have with your health care provider.   Document Released: 09/22/2005 Document Revised: 03/06/2012 Document Reviewed: 05/21/2015 Elsevier Interactive Patient Education [employer].

## 2015-12-22 NOTE — ED Notes (Signed)
Patient presents with c/o cough and body aches since last week. (+) vomiting at home.

## 2015-12-22 NOTE — ED Provider Notes (Signed)
[facility] Emergency Department Provider Note  ____________________________________________  Time seen: Approximately 10:10 PM  I have reviewed the triage vital signs and the nursing notes.   HISTORY  Chief Complaint Cough and Generalized Body Aches    HPI Kaylee Wolf is a 64 y.o. female with past medical history that includes seizure disorder and diabetes who presents with 3 days of persistent and gradual onset body aches, cough, congestion, and general malaise.  She has had one episode of vomiting at home.  When she arrived to the emergency department she had a very low-grade fever but a heart rate in the 120s and an oxygen saturation of 94%.  She thinks that she has the flu or she feels similar to when she believes she had the flu in the past.  Overall she describes the symptoms as moderate to severe but she is alert, oriented, laughing and joking with me and with her daughter who is also present.  She is taking some cough medicine at home but it is not making things better and nothing seems to make it worse.   Past Medical History  Diagnosis Date  . Type 2 diabetes mellitus ([place])   . Mixed hyperlipidemia   . Recurrent major depressive disorder ([place])   . Varicose veins   . Seizures ([place])   . Tinea pedis of both feet   . RLS (restless legs syndrome)   . Insomnia     Patient Active Problem List   Diagnosis Date Noted  . 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 Mixed hyperlipidemia 10/24/2015  . Recurrent major depressive disorder ([place]) 10/24/2015  . Varicose veins 10/24/2015  . Seizures ([place]) 10/24/2015  . Tinea pedis of both feet 10/24/2015  . Insomnia 10/24/2015  . RLS (restless legs syndrome) 10/24/2015  . Aggrieved 01/30/2013    Past Surgical History  Procedure Laterality Date  . None    . Brain surgery      Current Outpatient Rx  Name  Route  Sig  Dispense  Refill  . aspirin 81 MG  tablet   Oral   Take 81 mg by mouth daily.         . blood glucose meter kit and supplies KIT      Dispense based on patient and insurance preference. Check blood glucose once daily.   1 each   0   . Cholecalciferol (VITAMIN D-3) 1000 UNITS CAPS   Oral   Take 1 capsule by mouth daily.          . clotrimazole (LOTRIMIN) 1 % cream   Topical   Apply 1 application topically 2 (two) times daily.         [name] gabapentin (NEURONTIN) 100 MG capsule      Take 2 caps twice a day and 3 at bedtime.   270 capsule   6   . lamoTRIgine (LAMICTAL) 100 MG tablet   Oral   Take 125 mg by mouth daily.          [name] lisinopril (PRINIVIL,ZESTRIL) 2.5 MG tablet   Oral   Take 1 tablet (2.5 mg total) by mouth daily.   90 tablet   3   . lovastatin (MEVACOR) 20 MG tablet   Oral   Take 20 mg by mouth at bedtime.         . meloxicam (MOBIC) 15 MG tablet   Oral   Take 15 mg by mouth daily.         [name]  metFORMIN (GLUCOPHAGE) 500 MG tablet   Oral   Take 500 mg by mouth 2 (two) times daily with a meal.         . phenytoin (DILANTIN) 200 MG ER capsule   Oral   Take 200 mg by mouth 2 (two) times daily.          [name] rOPINIRole (REQUIP) 0.25 MG tablet      TAKE TWO TABLETS BY MOUTH AT NIGHT FOR RESTLESS LEG         . sertraline (ZOLOFT) 50 MG tablet   Oral   Take 50 mg by mouth daily.         [name] doxycycline (VIBRAMYCIN) 100 MG capsule      Take 1 capsule (100 mg) by mouth twice daily for 10 days.   20 capsule   0   . ondansetron (ZOFRAN) 4 MG tablet      Take 1-2 tabs by mouth every 8 hours as needed for nausea/vomiting   30 tablet   0     Allergies Review of patient's allergies indicates no known allergies.  Family History  Problem Relation Age of Onset  . Diabetes Mother   . Heart disease Mother   . Cancer Father     Social History Social History  Substance Use Topics  . Smoking status: Never Smoker   . Smokeless tobacco: Never Used  . Alcohol Use: No     Review of Systems Constitutional: Subjective fever and chills, general malaise, myalgias Eyes: No visual changes. ENT: No sore throat. Cardiovascular: Denies chest pain. Respiratory: Denies shortness of breath but is having frequent cough Gastrointestinal: No abdominal pain.  Nausea with one episode of emesis.  No diarrhea.  No constipation. Genitourinary: Negative for dysuria. Musculoskeletal: Negative for back pain. Skin: Negative for rash. Neurological: Negative for headaches, focal weakness or numbness.  10-point ROS otherwise negative.  ____________________________________________   PHYSICAL EXAM:  VITAL SIGNS: ED Triage Vitals  Enc Vitals Group     BP --      Pulse Rate 12/22/15 2141 125     Resp 12/22/15 2141 20     Temp 12/22/15 2141 99.2 F (37.3 C)     Temp Source 12/22/15 2141 Oral     SpO2 12/22/15 2141 94 %     Weight 12/22/15 2141 180 lb (81.647 kg)     Height 12/22/15 2141 '5\' 5"'  (1.651 m)     Head Cir --      Peak Flow --      Pain Score 12/22/15 2142 0     Pain Loc --      Pain Edu? --      Excl. in [place]? --     Constitutional: Alert and oriented. Well appearing and in no acute distress. Eyes: Conjunctivae are normal. PERRL. EOMI. Head: Atraumatic. Nose: No congestion/rhinnorhea. Mouth/Throat: Mucous membranes are moist.  Oropharynx non-erythematous. Neck: No stridor.  No meningismus Cardiovascular: Tachycardia, regular rhythm. Grossly normal heart sounds.  Good peripheral circulation. Respiratory: Normal respiratory effort.  No retractions. Lungs CTAB. Gastrointestinal: Soft and nontender. No distention. No abdominal bruits. No CVA tenderness. Musculoskeletal: No lower extremity tenderness nor edema.  No joint effusions. Neurologic:  Normal speech and language. No gross focal neurologic deficits are appreciated.  Skin:  Skin is warm, dry and intact. No rash noted. Psychiatric: Mood and affect are normal. Speech and behavior are  normal.  ____________________________________________   LABS (all labs ordered are listed, but only abnormal results are displayed)  Labs Reviewed  BASIC METABOLIC PANEL - Abnormal; Notable for the following:    Glucose, Bld 201 (*)    Calcium 8.8 (*)    All other components within normal limits  CBC  URINALYSIS COMPLETEWITH MICROSCOPIC ([facility] ONLY)   ____________________________________________  EKG  ED ECG REPORT I, Ariel Wingrove, the attending physician, personally viewed and interpreted this ECG.  Date: 12/22/2015 EKG Time: 23:38 Rate: 103 Rhythm: Borderline Sinus tachycardia QRS Axis: normal Intervals: normal ST/T Wave abnormalities: normal Conduction Disutrbances: none Narrative Interpretation: unremarkable  ____________________________________________  RADIOLOGY   Dg Chest 2 View  12/22/2015  CLINICAL DATA:  Acute onset of cough and body aches. Vomiting. Initial encounter. EXAM: CHEST  2 VIEW COMPARISON:  None. FINDINGS: The lungs are well-aerated. Mild bibasilar opacities may reflect atelectasis or mild pneumonia. There is no evidence of all pleural effusion or pneumothorax. The heart is borderline normal in size. No acute osseous abnormalities are seen. IMPRESSION: Mild bibasilar opacities may reflect atelectasis or mild pneumonia. Electronically Signed   By: Garald Balding M.D.   On: 12/22/2015 22:22    ____________________________________________   PROCEDURES  Procedure(s) performed: None  Critical Care performed: No ____________________________________________   INITIAL IMPRESSION / ASSESSMENT AND PLAN / ED COURSE  Pertinent labs & imaging results that were available during my care of the patient were reviewed by me and considered in my medical decision making (see chart for details).  In spite of her symptoms which I believe are due to a viral syndrome and her tachycardia, the patient is actually quite well-appearing and in no acute distress.   She is breathing easily and comfortably with no increased effort and she has normal lung sounds.  Her chest x-ray is interpreted as possible bibasilar atelectasis versus pneumonia.  To be on the safe side I will treat her empirically with doxycycline for community-acquired pneumonia which also, according to [email or web address]-[email or web address] should not lower her seizure threshold.  I have given her 1 L normal saline for her tachycardia and it is improved.  She is comfortable with the plan to go home and follow-up as an outpatient and I believe that she will continue to be mildly tachycardic and likely have a low-grade fever given that she is dealing with a viral syndrome and/or pneumonia but she is safe and appropriate for discharge and outpatient follow-up.  I discussed influenza testing with the patient and her daughter but the patient has had symptoms for 3 days and there is little to no benefit in checking influenza given that it will not alter her course, she is not coming into the hospital, and there is no other or additional efficacious medication to give her to help her treatment (not only is the benefit of Tamiflu questioned, it is not thought to be at all effective after 72 hours of symptoms).  I discussed this with the patient and her daughter and they agree with not testing for influenza.  ____________________________________________  FINAL CLINICAL IMPRESSION(S) / ED DIAGNOSES  Final diagnoses:  Viral syndrome  Community acquired pneumonia      NEW MEDICATIONS STARTED DURING THIS VISIT:  New Prescriptions   DOXYCYCLINE (VIBRAMYCIN) 100 MG CAPSULE    Take 1 capsule (100 mg) by mouth twice daily for 10 days.   ONDANSETRON (ZOFRAN) 4 MG TABLET    Take 1-2 tabs by mouth every 8 hours as needed for nausea/vomiting     Hinda Kehr, MD 12/22/15 2341

## 2015-12-22 NOTE — ED Notes (Signed)
MD at bedside. 

## 2015-12-23 DIAGNOSIS — B349 Viral infection, unspecified: Secondary | ICD-10-CM | POA: Diagnosis not present

## 2015-12-23 LAB — URINALYSIS COMPLETE WITH MICROSCOPIC (ARMC ONLY)
BILIRUBIN URINE: NEGATIVE
Glucose, UA: NEGATIVE mg/dL
KETONES UR: NEGATIVE mg/dL
NITRITE: NEGATIVE
PROTEIN: NEGATIVE mg/dL
SPECIFIC GRAVITY, URINE: 1.01 (ref 1.005–1.030)
pH: 6 (ref 5.0–8.0)

## 2015-12-23 MED ORDER — SODIUM CHLORIDE 0.9 % IV BOLUS (SEPSIS)
1000.0000 mL | Freq: Once | INTRAVENOUS | Status: AC
Start: 2015-12-23 — End: 2015-12-23
  Administered 2015-12-23: 1000 mL via INTRAVENOUS

## 2015-12-30 ENCOUNTER — Ambulatory Visit (INDEPENDENT_AMBULATORY_CARE_PROVIDER_SITE_OTHER): Payer: Medicare Other | Admitting: Family Medicine

## 2015-12-30 VITALS — BP 117/83 | HR 93 | Temp 97.9°F | Resp 16 | Ht 65.5 in | Wt 193.6 lb

## 2015-12-30 DIAGNOSIS — F332 Major depressive disorder, recurrent severe without psychotic features: Secondary | ICD-10-CM

## 2015-12-30 DIAGNOSIS — J189 Pneumonia, unspecified organism: Secondary | ICD-10-CM | POA: Diagnosis not present

## 2015-12-30 DIAGNOSIS — E1142 Type 2 diabetes mellitus with diabetic polyneuropathy: Secondary | ICD-10-CM | POA: Diagnosis not present

## 2015-12-30 LAB — POCT UA - MICROALBUMIN
Albumin/Creatinine Ratio, Urine, POC: 0
Creatinine, POC: 0 mg/dL
MICROALBUMIN (UR) POC: 20 mg/L

## 2015-12-30 NOTE — Progress Notes (Addendum)
Subjective:    Patient ID: Kaylee Wolf, female    DOB: [birth year], 65 y.o.   MRN: [id number]  HPI: Kaylee Wolf is a 65 y.o. female presenting on 12/30/2015 for Hospitalization Follow-up   HPI  Pt presents for pneumonia follow-up. She was treated for pneumonia on 12/26 in the ER. Still finishing up her antibiotics- Doxy she has 2 days lefts. Still coughing- it is productive. Sputum is getting lighter. Not short of breath. Overall feeling much better. SpO2 is 90%  Daughter is concerned about her depression. She was supposed to see psychiatry last year but refused to go. She had an acute grief reaction to death of husband and has never recovered. She reports feeling hopeless and sleeping most days. Over eating and little energy. No SI. Taking zoloft daily with little effect on symptoms.   Past Medical History  Diagnosis Date  . Type 2 diabetes mellitus (Kaylee Wolf)   . Mixed hyperlipidemia   . Recurrent major depressive disorder (Kaylee Wolf)   . Varicose veins   . Seizures (Kaylee Wolf)   . Tinea pedis of both feet   . RLS (restless legs syndrome)   . Insomnia     Current Outpatient Prescriptions on File Prior to Visit  Medication Sig  . aspirin 81 MG tablet Take 81 mg by mouth daily.  . blood glucose meter kit and supplies KIT Dispense based on patient and insurance preference. Check blood glucose once daily.  . Cholecalciferol (VITAMIN D-3) 1000 UNITS CAPS Take 1 capsule by mouth daily.   . clotrimazole (LOTRIMIN) 1 % cream Apply 1 application topically 2 (two) times daily.  [name] doxycycline (VIBRAMYCIN) 100 MG capsule Take 1 capsule (100 mg) by mouth twice daily for 10 days.  [name] gabapentin (NEURONTIN) 100 MG capsule Take 2 caps twice a day and 3 at bedtime.  . lamoTRIgine (LAMICTAL) 100 MG tablet Take 125 mg by mouth daily.   [name] lisinopril (PRINIVIL,ZESTRIL) 2.5 MG tablet Take 1 tablet (2.5 mg total) by mouth daily.  [name] lovastatin (MEVACOR) 20 MG tablet Take 20 mg by mouth at bedtime.  . meloxicam  (MOBIC) 15 MG tablet Take 15 mg by mouth daily.  . metFORMIN (GLUCOPHAGE) 500 MG tablet Take 500 mg by mouth 2 (two) times daily with a meal.  . ondansetron (ZOFRAN) 4 MG tablet Take 1-2 tabs by mouth every 8 hours as needed for nausea/vomiting  . phenytoin (DILANTIN) 200 MG ER capsule Take 200 mg by mouth 2 (two) times daily.   [name] rOPINIRole (REQUIP) 0.25 MG tablet TAKE TWO TABLETS BY MOUTH AT NIGHT FOR RESTLESS LEG  . sertraline (ZOLOFT) 50 MG tablet Take 50 mg by mouth daily.   No current facility-administered medications on file prior to visit.    Review of Systems  Constitutional: Negative for fever and chills.  HENT: Positive for congestion and postnasal drip.   Respiratory: Positive for cough. Negative for chest tightness and wheezing.   Cardiovascular: Negative for chest pain and leg swelling.  Gastrointestinal: Negative for nausea, vomiting, abdominal pain, diarrhea and constipation.  Endocrine: Negative.  Negative for cold intolerance, heat intolerance, polydipsia, polyphagia and polyuria.  Genitourinary: Negative for dysuria and difficulty urinating.  Musculoskeletal: Negative.   Neurological: Negative for dizziness, light-headedness and numbness.  Psychiatric/Behavioral: Positive for sleep disturbance, dysphoric mood and decreased concentration. Negative for suicidal ideas and confusion.   Per HPI unless specifically indicated above     Objective:    BP 117/83 mmHg  Pulse 93  Temp(Src) 97.9  F (36.6 C) (Oral)  Resp 16  Ht 5' 5.5" (1.664 m)  Wt 193 lb 9.6 oz (87.816 kg)  BMI 31.72 kg/m2  SpO2 96%  Wt Readings from Last 3 Encounters:  12/30/15 193 lb 9.6 oz (87.816 kg)  12/22/15 180 lb (81.647 kg)  11/25/15 198 lb 12.8 oz (90.175 kg)    Depression screen [facility] 2/9 12/30/2015 10/24/2015  Decreased Interest 0 0  Down, Depressed, Hopeless 2 0  PHQ - 2 Score 2 0  Altered sleeping 3 -  Tired, decreased energy 2 -  Change in appetite 3 -  Feeling bad or failure about  yourself  3 -  Trouble concentrating 1 -  Moving slowly or fidgety/restless 2 -  Suicidal thoughts 0 -  PHQ-9 Score 16 -  Difficult doing work/chores Very difficult -    Physical Exam  Constitutional: She is oriented to person, place, and time. She appears well-developed and well-nourished.  HENT:  Head: Normocephalic and atraumatic.  Neck: Neck supple.  Cardiovascular: Normal rate, regular rhythm and normal heart sounds.  Exam reveals no gallop and no friction rub.   No murmur heard. Pulmonary/Chest: Effort normal and breath sounds normal. She has no wheezes. She exhibits no tenderness.  Abdominal: Soft. Normal appearance and bowel sounds are normal. She exhibits no distension and no mass. There is no tenderness. There is no rebound and no guarding.  Musculoskeletal: Normal range of motion. She exhibits no edema or tenderness.  Lymphadenopathy:    She has no cervical adenopathy.  Neurological: She is alert and oriented to person, place, and time.  Skin: Skin is warm and dry.  Psychiatric: Her behavior is normal. Judgment and thought content normal. Cognition and memory are normal. She exhibits a depressed mood.   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       Relevant Orders   POCT UA - Microalbumin (Completed)     Other   Recurrent major depressive disorder [facility])    Referral to psychiatry per the request of the daughter. She has not responded to treatment by PCP.  She did not complete referral to psych last year. She is willing to go this year. Referral placed today. Pt informed if she feels she is a danger to herself to go  to ER.       Relevant Orders   Ambulatory referral to Psychiatry    Other Visit Diagnoses    Bilateral pneumonia    -  Primary    Complete ABx. Follow-up chest XR due on 01/15/16 to determine if pneumonia is resolved. Pneumovax is UTD.    Relevant Orders    DG Chest 2 View       No orders of the defined types were placed in this encounter.      Follow up plan: Return if symptoms worsen or fail to improve.

## 2015-12-30 NOTE — Assessment & Plan Note (Signed)
Referral to psychiatry per the request of the daughter. She has not responded to treatment by PCP.  She did not complete referral to psych last year. She is willing to go this year. Referral placed today. Pt informed if she feels she is a danger to herself to go to ER.

## 2015-12-30 NOTE — Assessment & Plan Note (Signed)
Microalbumin checked today.

## 2015-12-30 NOTE — Patient Instructions (Addendum)
You will need a follow-up chest X-Ray to ensure your pneumonia is resolved. Continue your antibiotics. You are up to date on your pneumonia shot.   We will refer you to psychiatry.

## 2016-01-06 ENCOUNTER — Ambulatory Visit (INDEPENDENT_AMBULATORY_CARE_PROVIDER_SITE_OTHER): Payer: Medicare Other | Admitting: Licensed Clinical Social Worker

## 2016-01-06 DIAGNOSIS — G40309 Generalized idiopathic epilepsy and epileptic syndromes, not intractable, without status epilepticus: Secondary | ICD-10-CM | POA: Diagnosis not present

## 2016-01-06 DIAGNOSIS — F32A Depression, unspecified: Secondary | ICD-10-CM

## 2016-01-06 DIAGNOSIS — F329 Major depressive disorder, single episode, unspecified: Secondary | ICD-10-CM | POA: Diagnosis not present

## 2016-01-06 DIAGNOSIS — R252 Cramp and spasm: Secondary | ICD-10-CM | POA: Diagnosis not present

## 2016-01-06 NOTE — Progress Notes (Signed)
Patient:   Kaylee Wolf   DOB:   [birth year]  MR Number:  [id number]  Location:  [facility] [address] [place] [address] Dept: [phone]           Date of Service:   01/06/2016  Start Time:   1p End Time:   2p  Provider/Observer:  Lubertha [place] Counselor       Billing Code/Service: [phone]  Behavioral Observation: Kaylee Wolf  presents as a 65 y.o.-year-old Caucasian Female who appeared her stated age. her dress was Appropriate and she was Fairly Groomed and her manners were Appropriate to the situation.  There were not any physical disabilities noted.  she displayed an appropriate level of cooperation and motivation.    Interactions:    Active   Attention:   within normal limits  Memory:   within normal limits  Speech (Volume):  normal  Speech:   normal pitch and normal volume  Thought Process:  Coherent  Though Content:  WNL  Orientation:   person, place, time/date and situation  Judgment:   Fair  Planning:   Fair  Affect:    Anxious  Mood:    Depressed  Insight:   Fair  Intelligence:   low  Chief Complaint:     Chief Complaint  Patient presents with  . Depression  . 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 Melrose Nakayama; last seizure was about 5 months ago Currently on Zoloft 42m  Source of Distress:              Talking to her  Marital Status/Living: Widowed for 316years/lives with 166year old Grandson and a cat Has a good relationship with her GYolanda Bonine Employment History: Disability for the past 2 years ago due to  seizures and learning disability Worked at [employer] years from 1996-2004  Education:   Attended [employer] dropped out in the 10th grade due to being picked on.  Was in special education classes  Legal History:  Denies  MCareers adviser  Denies   Religious/Spiritual Preferences:  [employer]  Family/Childhood History:                          [place] a [place] she fell and hit her head on a tree; her speech has not been the same since. Has 3 brothers and no sisters.  Patient is the 2nd youngest.  Raised by mother and stepdad.  Bio dad died when she was a small child   Children/Grand-children:    KSantiago Glad39  Natural/Informal Support:                           KSantiago Glad sister in law (Izora Gala   Substance Use:  No concerns of substance abuse are reported.     Medical History:   Past Medical History  Diagnosis Date  . Type 2 diabetes mellitus ([place]   . Mixed hyperlipidemia   . Recurrent major depressive disorder ([place]   . Varicose veins   .  Seizures ([place])   . Tinea pedis of both feet   . RLS (restless legs syndrome)   . Insomnia           Medication List       This list is accurate as of: 01/06/16  1:38 PM.  Always use your most recent med list.               aspirin 81 MG tablet  Take 81 mg by mouth daily.     blood glucose meter kit and supplies Kit  Dispense based on patient and insurance preference. Check blood glucose once daily.     clotrimazole 1 % cream  Commonly known as:  LOTRIMIN  Apply 1 application topically 2 (two) times daily.     doxycycline 100 MG capsule  Commonly known as:  VIBRAMYCIN  Take 1 capsule (100 mg) by mouth twice daily for 10 days.     gabapentin 100 MG capsule  Commonly known as:  NEURONTIN  Take 2 caps twice a day and 3 at bedtime.     lamoTRIgine 100 MG tablet  Commonly known as:  LAMICTAL  Take 125 mg by mouth daily.     lisinopril 2.5 MG tablet  Commonly known as:   PRINIVIL,ZESTRIL  Take 1 tablet (2.5 mg total) by mouth daily.     lovastatin 20 MG tablet  Commonly known as:  MEVACOR  Take 20 mg by mouth at bedtime.     meloxicam 15 MG tablet  Commonly known as:  MOBIC  Take 15 mg by mouth daily.     metFORMIN 500 MG tablet  Commonly known as:  GLUCOPHAGE  Take 500 mg by mouth 2 (two) times daily with a meal.     ondansetron 4 MG tablet  Commonly known as:  ZOFRAN  Take 1-2 tabs by mouth every 8 hours as needed for nausea/vomiting     phenytoin 200 MG ER capsule  Commonly known as:  DILANTIN  Take 200 mg by mouth 2 (two) times daily.     rOPINIRole 0.25 MG tablet  Commonly known as:  REQUIP  TAKE TWO TABLETS BY MOUTH AT NIGHT FOR RESTLESS LEG     sertraline 50 MG tablet  Commonly known as:  ZOLOFT  Take 50 mg by mouth daily.     Vitamin D-3 1000 units Caps  Take 1 capsule by mouth daily.              Sexual History:   History  Sexual Activity  . Sexual Activity: Not on file     Abuse/Trauma History: Denies    Psychiatric History:  Denies    Strengths:   cook   Recovery Goals:  "Help me get over this."  Hobbies/Interests:               Shopping   Challenges/Barriers: Getting along with others, screaming,     Family Med/Psych History:  Family History  Problem Relation Age of Onset  . Diabetes Mother   . Heart disease Mother   . Cancer Father     Risk of Suicide/Violence: virtually non-existent   History of Suicide/Violence: Denies  Psychosis:   Denies  Diagnosis:    Depression   Recommendation/Plan: Writer recommends Outpatient Therapy at least twice monthly to include but not limited to individual, group and or family therapy.  Medication Management is also recommended to assist with her mood.

## 2016-01-27 ENCOUNTER — Ambulatory Visit (INDEPENDENT_AMBULATORY_CARE_PROVIDER_SITE_OTHER): Payer: Medicare Other | Admitting: Licensed Clinical Social Worker

## 2016-01-27 DIAGNOSIS — F329 Major depressive disorder, single episode, unspecified: Secondary | ICD-10-CM

## 2016-01-27 DIAGNOSIS — F32A Depression, unspecified: Secondary | ICD-10-CM

## 2016-01-29 ENCOUNTER — Telehealth: Payer: Self-pay | Admitting: Family Medicine

## 2016-01-29 NOTE — Telephone Encounter (Signed)
It could be any number of things- medication reactions or infection. I can see her to evaluate- can they make an appt in the near future?.  If she falls and has an injury over the weekend or after hours she should be evaluated by ER or urgent care. Thanks! AK

## 2016-01-29 NOTE — Telephone Encounter (Signed)
Pt daughter in office today Andy Gauss you to know that her mom have fallen twice in a week they are not sure what is causing her to fall. [name]  Call back # is [phone]

## 2016-01-29 NOTE — Telephone Encounter (Signed)
Kaylee Wolf verbally spoke to pt and has appointment schedule.

## 2016-01-29 NOTE — Telephone Encounter (Signed)
LMTCB

## 2016-01-30 ENCOUNTER — Ambulatory Visit: Payer: Self-pay | Admitting: Family Medicine

## 2016-02-02 ENCOUNTER — Ambulatory Visit: Payer: Self-pay | Admitting: Family Medicine

## 2016-02-11 NOTE — Progress Notes (Signed)
   THERAPIST PROGRESS NOTE  Session Time: 87mn  Participation Level: Active  Behavioral Response: CasualAlertDepressed  Type of Therapy: Individual Therapy  Treatment Goals addressed: Coping and Diagnosis: Depression  Interventions: CBT, Motivational Interviewing and Reframing  Summary: Kaylee SCHWANis a 65y.o. female.  Therapist met with Patient in an initial therapy session to assess current mood and to build rapport. Therapist engaged Patient in discussion about her life and what is going well for her. Therapist provided support for Patient as she shared details about her life, her current stressors, mood, coping skills, and her past. Therapist prompted Patient to discuss her support system and ways that she manages her daily stress, anger, and frustrations. Therapist encouraged Patient to focus on her strengths and the positive aspects of her life versus the opposite. Therapist assisted Patient with this task by guiding her through a short list of job task that she is able to complete. Therapist praised Patient for her effort and encouraged her to go forward in pointing out the positive things about self. Therapist shifted focus of discussion to medication management. Therapist educated and encouraged Patient to make a medication management appointment. Therapist and Patient agreed on a meeting time for the upcoming week. Therapist will continue to assist Patient with making progress towards her goals through therapeutic intervention.    Suicidal/Homicidal: Nowithout intent/plan  Therapist Response:LCSW provided Patient with ongoing emotional support and encouragement.  Normalized her feelings.  Commended Patient on her progress and reinforced the importance of client staying focused on her own strengths and resources and resiliency. Processed various strategies for dealing with stressors.    Plan: Return again in 2 weeks.  Diagnosis: Axis I: Depression    Axis II: No  diagnosis    NLubertha South02/12/2015

## 2016-02-17 ENCOUNTER — Ambulatory Visit: Payer: Medicare Other | Admitting: Licensed Clinical Social Worker

## 2016-03-01 ENCOUNTER — Ambulatory Visit: Payer: Self-pay | Admitting: Family Medicine

## 2016-03-04 ENCOUNTER — Ambulatory Visit: Payer: Medicare Other | Admitting: Psychiatry

## 2016-03-10 ENCOUNTER — Encounter: Payer: Self-pay | Admitting: Psychiatry

## 2016-03-10 ENCOUNTER — Ambulatory Visit (INDEPENDENT_AMBULATORY_CARE_PROVIDER_SITE_OTHER): Payer: Medicare Other | Admitting: Psychiatry

## 2016-03-10 VITALS — BP 130/78 | HR 97 | Temp 97.8°F | Ht 65.0 in | Wt 203.6 lb

## 2016-03-10 DIAGNOSIS — F331 Major depressive disorder, recurrent, moderate: Secondary | ICD-10-CM

## 2016-03-10 MED ORDER — BUSPIRONE HCL 5 MG PO TABS: 5.0000 mg | ORAL_TABLET | Freq: Two times a day (BID) | ORAL | Status: DC

## 2016-03-10 MED ORDER — QUETIAPINE FUMARATE 50 MG PO TABS: 50.0000 mg | ORAL_TABLET | Freq: Every day | ORAL | Status: DC

## 2016-03-10 NOTE — Progress Notes (Signed)
Psychiatric Initial Adult Assessment   Patient Identification: Kaylee Wolf MRN:  [id number] Date of Evaluation:  03/10/2016 Referral Source: Primary care physician Chief Complaint:   Chief Complaint    Establish Care; Anxiety; Depression; Stress     Visit Diagnosis:    ICD-9-CM ICD-10-CM   1. MDD (major depressive disorder), recurrent episode, moderate (HCC) 296.32 F33.1    Diagnosis:   Patient Active Problem List   Diagnosis Date Noted  . 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 Mixed hyperlipidemia [E78.2] 10/24/2015  . Recurrent major depressive disorder (HCC) [F33.9] 10/24/2015  . Varicose veins [I86.8] 10/24/2015  . Seizures (HCC) [R56.9] 10/24/2015  . Tinea pedis of both feet [B35.3] 10/24/2015  . Insomnia [G47.00] 10/24/2015  . RLS (restless legs syndrome) [G25.81] 10/24/2015  . Aggrieved [F43.21] 01/30/2013  . Clinical depression [F32.9] 11/16/2012  . Avitaminosis D [E55.9] 11/13/2012  . HLD (hyperlipidemia) [E78.5] 08/14/2012  . Seizure (HCC) [R56.9] 04/20/2012  . Diabetes mellitus (HCC) [E11.9] 04/19/2012   History of Present Illness:    Patient is a 65-year-old widowed female who presented for initial assessment accompanied by her daughter. She was referred by her primary care physician. Patient reported that she  feels " ill most of the time and snap in no time".  Her daughter reported that she has been becoming more agitated and depressed since the death of their father 4 years ago. Her symptoms have been getting worse for the past few months when her medications have been adjusted by her primary care physician and her neurologist   Patient reported that she had been becoming depressed since the death of her husband as she had  very close relationship with him as they grew to gether and then he was diagnosed with cancer and he passed away quickly.  She reported that she currently lives by herself and her only daughter lives close by. She has good relationship with her. She has 3 grandchildren. She tries to keep herself occupied by doing things and visiting her friends and daughter and grandchildren. Patient also mentioned that she has severe neuropathy related to her diabetes and she feels that her legs start to become very warm especially at night and she has restless legs. Her medications have been adjusted by her primary care physician but they are not helpful.She also has history of seizure disorder and her neurologist Dr. Potter has been trying to adjust her medications. He has started her on lamotrigine and later on added the Depakote recently. Her daughter-in-law has noted that her mood symptoms have changed significantly since the addition of Depakote and she is becoming more agitated. She is also taking Dilantin at this time and they're planning to taper her out of the Dilantin due to the adverse effects related to the medications Patient reported that she does not drink or use any drugs or alcohol. She has been compliant with her medications and her blood sugar is usually in the normal range She sleeps well at night. She reported that she wants to take medications for anxiety and mood swings.  Patient currently denied having any suicidal homicidal ideations or plans. Patient appeared apprehensive during the interview.   Elements:  Severity:  moderate. Associated Signs/Symptoms: Depression Symptoms:  depressed mood, anhedonia, psychomotor agitation, fatigue, feelings of worthlessness/guilt, difficulty concentrating, hopelessness, anxiety, loss of energy/fatigue, disturbed sleep, decreased appetite, (Hypo) Manic Symptoms:  Distractibility, Flight of Ideas, Impulsivity, Irritable   Mood, Anxiety Symptoms:  Excessive Worry, Psychotic Symptoms:  none PTSD Symptoms: Negative NA  Past Medical History:  Past Medical History   Diagnosis Date  . Type 2 diabetes mellitus ([place])   . Mixed hyperlipidemia   . Recurrent major depressive disorder ([place])   . Varicose veins   . Seizures ([place])   . Tinea pedis of both feet   . RLS (restless legs syndrome)   . Insomnia   . Anxiety     Past Surgical History  Procedure Laterality Date  . None     Family History:  Family History  Problem Relation Age of Onset  . Diabetes Mother   . Heart disease Mother   . Cancer Father    Social History:   Social History   Social History  . Marital Status: Widowed    Spouse Name: N/A  . Number of Children: N/A  . Years of Education: N/A   Social History Main Topics  . Smoking status: Current Some Day Smoker    Types: Cigarettes  . Smokeless tobacco: Never Used  . Alcohol Use: No  . Drug Use: No  . Sexual Activity: Not Currently   Other Topics Concern  . None   Social History Narrative   Additional Social History:  Patient is currently widowed and lives by herself  Musculoskeletal: Strength & Muscle Tone: within normal limits Gait & Station: normal Patient leans: N/A  Psychiatric Specialty Exam: HPI  ROS  Blood pressure 130/78, pulse 97, temperature 97.8 F (36.6 C), temperature source Tympanic, height 5' 5" (1.651 m), weight 203 lb 9.6 oz (92.352 kg), SpO2 95 %.Body mass index is 33.88 kg/(m^2).  General Appearance: Casual and Fairly Groomed  Eye Contact:  Fair  Speech:  Clear and Coherent and Normal Rate  Volume:  Normal  Mood:  Anxious and Depressed  Affect:  Congruent  Thought Process:  Coherent  Orientation:  Full (Time, Place, and Person)  Thought Content:  WDL  Suicidal Thoughts:  No  Homicidal Thoughts:  No  Memory:  Immediate;   Fair  Judgement:  Intact  Insight:  Fair  Psychomotor Activity:  Decreased  Concentration:  Fair  Recall:  [employer] of Knowledge:Fair  Language: Fair  Akathisia:  No  Handed:  Right  AIMS (if indicated):    Assets:  Communication Skills Desire for  Improvement Social Support Transportation  ADL's:  Intact  Cognition: WNL  Sleep:     Is the patient at risk to self?  No. Has the patient been a risk to self in the past 6 months?  No. Has the patient been a risk to self within the distant past?  No. Is the patient a risk to others?  No. Has the patient been a risk to others in the past 6 months?  No. Has the patient been a risk to others within the distant past?  No.  Allergies:  No Known Allergies Current Medications: Current Outpatient Prescriptions  Medication Sig Dispense Refill  . aspirin 81 MG tablet Take 81 mg by mouth daily.    . blood glucose meter kit and supplies KIT Dispense based on patient and insurance preference. Check blood glucose once daily. 1 each 0  . Cholecalciferol (VITAMIN D-3) 1000 UNITS CAPS Take 1 capsule by mouth daily.     . clotrimazole (LOTRIMIN) 1 % cream Apply 1 application topically 2 (two) times daily.    . divalproex (DEPAKOTE) 125 MG DR tablet     . gabapentin (NEURONTIN)  100 MG capsule Take 2 caps twice a day and 3 at bedtime. 270 capsule 6  . lamoTRIgine (LAMICTAL) 100 MG tablet Take 125 mg by mouth daily.     [name] lisinopril (PRINIVIL,ZESTRIL) 2.5 MG tablet Take 1 tablet (2.5 mg total) by mouth daily. 90 tablet 3  . lovastatin (MEVACOR) 20 MG tablet Take 20 mg by mouth at bedtime.    . meloxicam (MOBIC) 15 MG tablet Take 15 mg by mouth daily.    . metFORMIN (GLUCOPHAGE) 500 MG tablet Take 500 mg by mouth 2 (two) times daily with a meal.    . phenytoin (DILANTIN) 200 MG ER capsule Take 200 mg by mouth 2 (two) times daily.     [name] rOPINIRole (REQUIP) 0.25 MG tablet TAKE TWO TABLETS BY MOUTH AT NIGHT FOR RESTLESS LEG    . sertraline (ZOLOFT) 50 MG tablet Take 50 mg by mouth daily.     No current facility-administered medications for this visit.    Previous Psychotropic Medications:no  Substance Abuse History in the last 12 months:  No.  Consequences of Substance Abuse: Negative NA  Medical  Decision Making:  Review of Psycho-Social Stressors (1)  Treatment Plan Summary: Medication management   Discussed with patient what the medications treatment risks benefits and alternatives. Advised patient that she might be having adverse effects related to  combination of Depakote and lamotrigine and her daughter reported that she will discuss with Dr. Melrose Nakayama about the same. Will start her on Seroquel 50 mg daily at bedtime for the anxiety and mood symptoms Discontinue sertraline Start her on BuSpar 5 mg twice a day for anxiety  Also advised her to start taking vitamins including vitamin D and vitamin E  Follow-up in 1 month   More than 50% of the time spent in psychoeducation, counseling and coordination of care.    This note was generated in part or whole with voice recognition software. Voice regonition is usually quite accurate but there are transcription errors that can and very often do occur. I apologize for any typographical errors that were not detected and corrected.   Rainey Pines, MD  3/15/20172:19 PM

## 2016-03-11 ENCOUNTER — Other Ambulatory Visit: Payer: Self-pay

## 2016-03-11 ENCOUNTER — Telehealth: Payer: Self-pay | Admitting: Family Medicine

## 2016-03-11 DIAGNOSIS — E119 Type 2 diabetes mellitus without complications: Secondary | ICD-10-CM

## 2016-03-11 DIAGNOSIS — E1142 Type 2 diabetes mellitus with diabetic polyneuropathy: Secondary | ICD-10-CM

## 2016-03-11 MED ORDER — BLOOD GLUCOSE METER KIT: PACK | Status: DC

## 2016-03-11 NOTE — Telephone Encounter (Signed)
Advised and patient will check sugars but I also advised she should be checking this for DM2 anyways. Lost last script so I called this one in.JH

## 2016-03-11 NOTE — Telephone Encounter (Signed)
Pt saw psychiatrist and was prescribed buspirone 5 mg 1 tab twice daily and quetiapine 50 mg 1 tab at bedtime.  [name] was told one of the two medications could increase blood sugar.  She would like to get glucose meter so she could keep a check on pt's sugar.  Her call back number is [phone]

## 2016-03-29 ENCOUNTER — Ambulatory Visit: Payer: Medicare Other | Admitting: Psychiatry

## 2016-04-03 DIAGNOSIS — R42 Dizziness and giddiness: Secondary | ICD-10-CM | POA: Diagnosis not present

## 2016-04-03 DIAGNOSIS — Z7689 Persons encountering health services in other specified circumstances: Secondary | ICD-10-CM | POA: Diagnosis not present

## 2016-04-03 DIAGNOSIS — R55 Syncope and collapse: Secondary | ICD-10-CM | POA: Diagnosis not present

## 2016-04-14 ENCOUNTER — Encounter: Payer: Self-pay | Admitting: Psychiatry

## 2016-04-14 ENCOUNTER — Ambulatory Visit (INDEPENDENT_AMBULATORY_CARE_PROVIDER_SITE_OTHER): Payer: Medicare Other | Admitting: Psychiatry

## 2016-04-14 VITALS — BP 118/70 | HR 85 | Temp 97.2°F | Ht 65.0 in | Wt 203.2 lb

## 2016-04-14 DIAGNOSIS — F3481 Disruptive mood dysregulation disorder: Secondary | ICD-10-CM | POA: Diagnosis not present

## 2016-04-14 MED ORDER — BUSPIRONE HCL 5 MG PO TABS: 5.0000 mg | ORAL_TABLET | Freq: Two times a day (BID) | ORAL | Status: DC

## 2016-04-14 MED ORDER — QUETIAPINE FUMARATE 25 MG PO TABS: 75.0000 mg | ORAL_TABLET | Freq: Every day | ORAL | Status: DC

## 2016-04-14 NOTE — Progress Notes (Signed)
Psychiatric MD Progress NOTE  Patient Identification: Kaylee Wolf MRN:  [id number] Date of Evaluation:  04/14/2016 Referral Source: Primary care physician Chief Complaint:   Chief Complaint    Follow-up; Medication Refill; Stress     Visit Diagnosis:    ICD-9-CM ICD-10-CM   1. Disruptive mood dysregulation disorder ([place]) 296.99 F34.81    Diagnosis:   Patient Active Problem List   Diagnosis Date Noted  . Idiopathic generalized epilepsy ([place]) [A35.573] 01/06/2016  . 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 Mixed hyperlipidemia [E78.2] 10/24/2015  . Recurrent major depressive disorder ([place]) [F33.9] 10/24/2015  . Varicose veins [I86.8] 10/24/2015  . Seizures ([place]) [R56.9] 10/24/2015  . Tinea pedis of both feet [B35.3] 10/24/2015  . Insomnia [G47.00] 10/24/2015  . RLS (restless legs syndrome) [G25.81] 10/24/2015  . Aggrieved [F43.21] 01/30/2013  . Clinical depression [F32.9] 11/16/2012  . Avitaminosis D [E55.9] 11/13/2012  . HLD (hyperlipidemia) [E78.5] 08/14/2012  . Seizure ([place]) [R56.9] 04/20/2012  . Diabetes mellitus ([place]) [E11.9] 04/19/2012   History of Present Illness:    Patient is a 65 year old widowed female who presented for Follow-up accompanied by her daughter. She was referred by her primary care physician. Patient appeared agitated during the interview. Her daughter reported that she woke up like this in the morning. She continues to have mood swings anger anxiety and agitation. Her daughter reported that she snaps quickly and is unable to control her mood swings. Patient also acknowledges the same. She reported that she has been becoming agitated and has been throwing things around the house. She has been depressed and angry since the death of her husband and is unable to control her behavior. She reported that she acknowledges her behavior and is trying to control herself. She will try to go out of the house and  will walk with her dog. However this behavior is not under her control. Her daughter reported that she tried to call her neurologist Dr. Melrose Nakayama and discussed about her medication adjustment but he is trying to discontinue the Dilantin first before making any adjustments to her medications. Her daughter reported that she has been becoming more agitated and depressed since the death of their father 4 years ago.   Her daughter remains supportive. Patient is also acknowledging her symptoms and is willing to have her medications adjusted at this time. She currently denied using any drugs or alcohol. She has been sleeping well with the help of Seroquel and is willing to have her medications adjusted. .   Elements:  Severity:  moderate. Associated Signs/Symptoms: Depression Symptoms:  depressed mood, anhedonia, psychomotor agitation, fatigue, feelings of worthlessness/guilt, difficulty concentrating, hopelessness, anxiety, loss of energy/fatigue, disturbed sleep, decreased appetite, (Hypo) Manic Symptoms:  Distractibility, Flight of Ideas, Impulsivity, Irritable Mood, Anxiety Symptoms:  Excessive Worry, Psychotic Symptoms:  none PTSD Symptoms: Negative NA  Past Medical History:  Past Medical History  Diagnosis Date  . Type 2 diabetes mellitus ([place])   . Mixed hyperlipidemia   . Recurrent major depressive disorder ([place])   . Varicose veins   . Seizures ([place])   . Tinea pedis of both feet   . RLS (restless legs syndrome)   . Insomnia   . Anxiety     Past Surgical History  Procedure Laterality Date  . None     Family History:  Family History  Problem Relation Age of Onset  . Diabetes Mother   . Heart disease Mother   . Cancer  Father    Social History:   Social History   Social History  . Marital Status: Widowed    Spouse Name: N/A  . Number of Children: N/A  . Years of Education: N/A   Social History Main Topics  . Smoking status: Never Smoker   . Smokeless tobacco:  Never Used  . Alcohol Use: No  . Drug Use: No  . Sexual Activity: Not Currently   Other Topics Concern  . None   Social History Narrative   Additional Social History:  Patient is currently widowed and lives by herself  Musculoskeletal: Strength & Muscle Tone: within normal limits Gait & Station: normal Patient leans: N/A  Psychiatric Specialty Exam: HPI  ROS  Blood pressure 118/70, pulse 85, temperature 97.2 F (36.2 C), temperature source Tympanic, height '5\' 5"'  (1.651 m), weight 203 lb 3.2 oz (92.171 kg), SpO2 92 %.Body mass index is 33.81 kg/(m^2).  General Appearance: Casual and Fairly Groomed  Eye Contact:  Fair  Speech:  Clear and Coherent and Normal Rate  Volume:  Normal  Mood:  Anxious and Depressed  Affect:  Congruent  Thought Process:  Coherent  Orientation:  Full (Time, Place, and Person)  Thought Content:  WDL  Suicidal Thoughts:  No  Homicidal Thoughts:  No  Memory:  Immediate;   Fair  Judgement:  Intact  Insight:  Fair  Psychomotor Activity:  Decreased  Concentration:  Fair  Recall:  [employer] of Knowledge:Fair  Language: Fair  Akathisia:  No  Handed:  Right  AIMS (if indicated):    Assets:  Communication Skills Desire for Improvement Social Support Transportation  ADL's:  Intact  Cognition: WNL  Sleep:     Is the patient at risk to self?  No. Has the patient been a risk to self in the past 6 months?  No. Has the patient been a risk to self within the distant past?  No. Is the patient a risk to others?  No. Has the patient been a risk to others in the past 6 months?  No. Has the patient been a risk to others within the distant past?  No.  Allergies:  No Known Allergies Current Medications: Current Outpatient Prescriptions  Medication Sig Dispense Refill  . aspirin 81 MG tablet Take 81 mg by mouth daily.    . blood glucose meter kit and supplies KIT Dispense based on patient and insurance preference. Check blood glucose once daily. 1 each  0  . blood glucose meter kit and supplies Dispense based on patient and insurance preference. Use up to four times daily as directed. (FOR ICD-9 250.00, 250.01). 1 each 0  . busPIRone (BUSPAR) 5 MG tablet Take 1 tablet (5 mg total) by mouth 2 (two) times daily. 60 tablet 1  . Cholecalciferol (VITAMIN D-3) 1000 UNITS CAPS Take 1 capsule by mouth daily.     . clotrimazole (LOTRIMIN) 1 % cream Apply 1 application topically 2 (two) times daily.    . divalproex (DEPAKOTE) 125 MG DR tablet     . gabapentin (NEURONTIN) 100 MG capsule Take 2 caps twice a day and 3 at bedtime. 270 capsule 6  . lamoTRIgine (LAMICTAL) 100 MG tablet Take 125 mg by mouth daily.     [name] lisinopril (PRINIVIL,ZESTRIL) 2.5 MG tablet Take 1 tablet (2.5 mg total) by mouth daily. 90 tablet 3  . lovastatin (MEVACOR) 20 MG tablet Take 20 mg by mouth at bedtime.    . meloxicam (MOBIC) 15 MG tablet Take  15 mg by mouth daily.    . metFORMIN (GLUCOPHAGE) 500 MG tablet Take 500 mg by mouth 2 (two) times daily with a meal.    . phenytoin (DILANTIN) 200 MG ER capsule Take 200 mg by mouth 2 (two) times daily.     . QUEtiapine (SEROQUEL) 25 MG tablet Take 3 tablets (75 mg total) by mouth at bedtime. 90 tablet 1  . rOPINIRole (REQUIP) 0.25 MG tablet TAKE TWO TABLETS BY MOUTH AT NIGHT FOR RESTLESS LEG     No current facility-administered medications for this visit.    Previous Psychotropic Medications:no  Substance Abuse History in the last 12 months:  No.  Consequences of Substance Abuse: Negative NA  Medical Decision Making:  Review of Psycho-Social Stressors (1)  Treatment Plan Summary: Medication management   Discussed with patient what the medications treatment risks benefits and alternatives.  Will increase Seroquel 75  mg daily at bedtime for the anxiety and mood symptoms  Continue  BuSpar 5 mg twice a day for anxiety  Also advised her to start taking vitamins including vitamin D and vitamin E  Follow-up in 1 month    More than 50% of the time spent in psychoeducation, counseling and coordination of care.    This note was generated in part or whole with voice recognition software. Voice regonition is usually quite accurate but there are transcription errors that can and very often do occur. I apologize for any typographical errors that were not detected and corrected.   Rainey Pines, MD  4/19/201710:30 AM

## 2016-04-21 ENCOUNTER — Ambulatory Visit: Payer: Medicare Other | Admitting: Licensed Clinical Social Worker

## 2016-04-26 ENCOUNTER — Encounter: Payer: Self-pay | Admitting: Family Medicine

## 2016-04-28 ENCOUNTER — Other Ambulatory Visit: Payer: Self-pay | Admitting: Family Medicine

## 2016-05-11 ENCOUNTER — Encounter: Payer: Self-pay | Admitting: Family Medicine

## 2016-05-11 ENCOUNTER — Ambulatory Visit: Payer: Self-pay | Admitting: Family Medicine

## 2016-05-13 ENCOUNTER — Other Ambulatory Visit: Payer: Self-pay | Admitting: Psychiatry

## 2016-05-13 ENCOUNTER — Ambulatory Visit: Payer: Medicare Other | Admitting: Psychiatry

## 2016-05-13 ENCOUNTER — Other Ambulatory Visit: Payer: Self-pay | Admitting: Family Medicine

## 2016-05-14 ENCOUNTER — Other Ambulatory Visit: Payer: Self-pay | Admitting: Psychiatry

## 2016-05-14 ENCOUNTER — Other Ambulatory Visit: Payer: Self-pay | Admitting: Family Medicine

## 2016-05-26 ENCOUNTER — Other Ambulatory Visit: Payer: Self-pay | Admitting: Psychiatry

## 2016-05-30 ENCOUNTER — Encounter: Payer: Self-pay | Admitting: Emergency Medicine

## 2016-05-30 ENCOUNTER — Emergency Department
Admission: EM | Admit: 2016-05-30 | Discharge: 2016-05-30 | Disposition: A | Discharge status: Home / Self Care | Payer: Medicare Other | Attending: Emergency Medicine | Admitting: Emergency Medicine

## 2016-05-30 DIAGNOSIS — Z7984 Long term (current) use of oral hypoglycemic drugs: Secondary | ICD-10-CM | POA: Insufficient documentation

## 2016-05-30 DIAGNOSIS — E785 Hyperlipidemia, unspecified: Secondary | ICD-10-CM | POA: Insufficient documentation

## 2016-05-30 DIAGNOSIS — Z79899 Other long term (current) drug therapy: Secondary | ICD-10-CM | POA: Diagnosis not present

## 2016-05-30 DIAGNOSIS — L259 Unspecified contact dermatitis, unspecified cause: Secondary | ICD-10-CM | POA: Insufficient documentation

## 2016-05-30 DIAGNOSIS — R21 Rash and other nonspecific skin eruption: Secondary | ICD-10-CM | POA: Diagnosis present

## 2016-05-30 DIAGNOSIS — Z7982 Long term (current) use of aspirin: Secondary | ICD-10-CM | POA: Insufficient documentation

## 2016-05-30 DIAGNOSIS — E119 Type 2 diabetes mellitus without complications: Secondary | ICD-10-CM | POA: Insufficient documentation

## 2016-05-30 MED ORDER — DEXAMETHASONE 2 MG PO TABS: ORAL_TABLET | ORAL | Status: DC

## 2016-05-30 MED ORDER — MUPIROCIN 2 % EX OINT: 1.0000 "application " | TOPICAL_OINTMENT | Freq: Two times a day (BID) | CUTANEOUS | Status: DC

## 2016-05-30 MED ORDER — HYDROXYZINE PAMOATE 25 MG PO CAPS: 25.0000 mg | ORAL_CAPSULE | Freq: Three times a day (TID) | ORAL | Status: DC | PRN

## 2016-05-30 NOTE — ED Provider Notes (Signed)
[facility] Emergency Department Provider Note  ____________________________________________  Time seen: Approximately 3:24 PM  I have reviewed the triage vital signs and the nursing notes.   HISTORY  Chief Complaint No chief complaint on file.    HPI Kaylee Wolf is a 65 y.o. female , NAD, presents to the emergency department with one-day history of skin sores about her neck. Patient states she was working in her yard yesterday, clearing leaves from a garden bed. Notes she woke this morning with redness, itching and burning about her neck with some rash in the area. Denies any oozing or weeping but some of the lesions have been scabbed today. States she feels the sensation is now beginning to start about her cheeks. Denies any changes in lotions, soaps, detergents. No new medications. Denies any visual changes or pain or itching about the eyes. Denies chest pain, shortness breath, difficulty swallowing, wheezing, swelling about the neck/tongue/lips/throat.   Past Medical History  Diagnosis Date  . Type 2 diabetes mellitus ([place])   . Mixed hyperlipidemia   . Recurrent major depressive disorder ([place])   . Varicose veins   . Seizures ([place])   . Tinea pedis of both feet   . RLS (restless legs syndrome)   . Insomnia   . Anxiety     Patient Active Problem List   Diagnosis Date Noted  . 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 Mixed hyperlipidemia 10/24/2015  . Recurrent major depressive disorder ([place]) 10/24/2015  . Varicose veins 10/24/2015  . Seizures ([place]) 10/24/2015  . Tinea pedis of both feet 10/24/2015  . Insomnia 10/24/2015  . RLS (restless legs syndrome) 10/24/2015  . Aggrieved 01/30/2013  . Clinical depression 11/16/2012  . Avitaminosis D 11/13/2012  . HLD (hyperlipidemia) 08/14/2012  . Seizure ([place]) 04/20/2012  . Diabetes mellitus ([place]) 04/19/2012     Past Surgical History  Procedure Laterality Date  . None      Current Outpatient Rx  Name  Route  Sig  Dispense  Refill  . aspirin 81 MG tablet   Oral   Take 81 mg by mouth daily.         . blood glucose meter kit and supplies KIT      Dispense based on patient and insurance preference. Check blood glucose once daily.   1 each   0   . blood glucose meter kit and supplies      Dispense based on patient and insurance preference. Use up to four times daily as directed. (FOR ICD-9 250.00, 250.01).   1 each   0   . busPIRone (BUSPAR) 5 MG tablet   Oral   Take 1 tablet (5 mg total) by mouth 2 (two) times daily.   60 tablet   1   . Cholecalciferol (VITAMIN D-3) 1000 UNITS CAPS   Oral   Take 1 capsule by mouth daily.          . clotrimazole (LOTRIMIN) 1 % cream   Topical   Apply 1 application topically 2 (two) times daily.         [name] dexamethasone (DECADRON) 2 MG tablet      Take 6 tablets on Day 1 with food, then decrease by 1 tablet daily until finished (6,5,4,3,2,1)   21 tablet   0   . divalproex (DEPAKOTE) 125 MG DR tablet               .  gabapentin (NEURONTIN) 100 MG capsule      Take 2 caps twice a day and 3 at bedtime.   270 capsule   6   . hydrOXYzine (VISTARIL) 25 MG capsule   Oral   Take 1 capsule (25 mg total) by mouth 3 (three) times daily as needed.   21 capsule   0   . lamoTRIgine (LAMICTAL) 100 MG tablet   Oral   Take 125 mg by mouth daily.          [name] lisinopril (PRINIVIL,ZESTRIL) 2.5 MG tablet   Oral   Take 1 tablet (2.5 mg total) by mouth daily.   90 tablet   3   . lovastatin (MEVACOR) 20 MG tablet      TAKE TWO TABLETS BY MOUTH ONCE DAILY WITH DINNER   60 tablet   1     Pt needs appt to be seen for further refills.   . meloxicam (MOBIC) 15 MG tablet   Oral   Take 15 mg by mouth daily.         . metFORMIN (GLUCOPHAGE) 500 MG tablet      TAKE ONE TABLET BY MOUTH TWICE DAILY   60 tablet   2   . mupirocin  ointment (BACTROBAN) 2 %   Topical   Apply 1 application topically 2 (two) times daily.   30 g   0   . phenytoin (DILANTIN) 200 MG ER capsule   Oral   Take 200 mg by mouth 2 (two) times daily.          . QUEtiapine (SEROQUEL) 25 MG tablet   Oral   Take 3 tablets (75 mg total) by mouth at bedtime.   90 tablet   1   . rOPINIRole (REQUIP) 0.25 MG tablet      TAKE TWO TABLETS BY MOUTH AT NIGHT FOR RESTLESS LEG           Allergies Review of patient's allergies indicates no known allergies.  Family History  Problem Relation Age of Onset  . Diabetes Mother   . Heart disease Mother   . Cancer Father     Social History Social History  Substance Use Topics  . Smoking status: Never Smoker   . Smokeless tobacco: Never Used  . Alcohol Use: No     Review of Systems  Constitutional: No fever/chills, Fatigue Eyes: No visual changes. No discharge, Swelling, redness ENT: No sore throat, Itchy throat, difficulty swallowing, swelling about lip/tongue/throat/neck. Cardiovascular: No chest pain. Respiratory: No cough. No shortness of breath. No wheezing.  Gastrointestinal: No abdominal pain.  No nausea, vomiting.   Musculoskeletal: Negative for myalgias.  Skin: Positive for rash and skin sores. Neurological: Negative for headaches, focal weakness or numbness. No tingling 10-point ROS otherwise negative.  ____________________________________________   PHYSICAL EXAM:  VITAL SIGNS: ED Triage Vitals  Enc Vitals Group     BP 05/30/16 1440 131/75 mmHg     Pulse Rate 05/30/16 1440 95     Resp 05/30/16 1440 18     Temp 05/30/16 1440 98.5 F (36.9 C)     Temp Source 05/30/16 1440 Oral     SpO2 05/30/16 1440 94 %     Weight 05/30/16 1440 200 lb (90.719 kg)     Height 05/30/16 1440 '5\' 5"'  (1.651 m)     Head Cir --      Peak Flow --      Pain Score 05/30/16 1450 10     Pain Loc --  Pain Edu? --      Excl. in [place]? --      Constitutional: Alert and oriented. Well  appearing and in no acute distress. Eyes: Conjunctivae are normal. Bilateral upper and lower eyelids without erythema, swelling, skin sores. EOMI without pain Head: Atraumatic. ENT:      Ears:       Nose: No congestion/rhinnorhea.      Mouth/Throat: Lips without swelling, erythema, edema Neck: No stridor. Supple with full range of motion. Hematological/Lymphatic/Immunilogical: No cervical lymphadenopathy. Cardiovascular:  Good peripheral circulation with 2+ pulses in bilateral upper extremities. Respiratory: Normal respiratory effort without tachypnea or retractions.  Neurologic:  Normal speech and language. No gross focal neurologic deficits are appreciated. He and posture are normal Skin:  Erythematous patchy rash noted about the upper chest/base of the neck with open vesicles that have scabbed over. Bilateral cheeks with mild erythema but no evidence of patches or vesicles. Skin is warm, dry.  Psychiatric: Mood and affect are normal. Speech and behavior are normal. Patient exhibits appropriate insight and judgement.   ____________________________________________   LABS  None ____________________________________________  EKG  None ____________________________________________  RADIOLOGY  None ____________________________________________    PROCEDURES  Procedure(s) performed: None    Medications - No data to display   ____________________________________________   INITIAL IMPRESSION / ASSESSMENT AND PLAN / ED COURSE  Patient's diagnosis is consistent with contact dermatitis. Patient will be discharged home with prescriptions for Decadron, Vistaril, Bactroban ointment to use as directed. Patient is advised to apply cool compresses as needed to decrease itching. Patient should keep the skin clean and dry at all times. Patient is to follow up with her primary care provider if symptoms persist past this treatment course. Patient is given ED precautions to return to the  ED for any worsening or new symptoms.    ____________________________________________  FINAL CLINICAL IMPRESSION(S) / ED DIAGNOSES  Final diagnoses:  Contact dermatitis      NEW MEDICATIONS STARTED DURING THIS VISIT:  Discharge Medication List as of 05/30/2016  3:28 PM    START taking these medications   Details  dexamethasone (DECADRON) 2 MG tablet Take 6 tablets on Day 1 with food, then decrease by 1 tablet daily until finished (6,5,4,3,2,1), Print    hydrOXYzine (VISTARIL) 25 MG capsule Take 1 capsule (25 mg total) by mouth 3 (three) times daily as needed., Starting 05/30/2016, Until Discontinued, Print    mupirocin ointment (BACTROBAN) 2 % Apply 1 application topically 2 (two) times daily., Starting 05/30/2016, Until Discontinued, [place], PA-C 05/30/16 1543  Orbie Pyo, MD 05/30/16 [phone]

## 2016-05-30 NOTE — Discharge Instructions (Signed)
Contact Dermatitis Dermatitis is redness, soreness, and swelling (inflammation) of the skin. Contact dermatitis is a reaction to certain substances that touch the skin. There are two types of contact dermatitis:   Irritant contact dermatitis. This type is caused by something that irritates your skin, such as dry hands from washing them too much. This type does not require previous exposure to the substance for a reaction to occur. This type is more common.  Allergic contact dermatitis. This type is caused by a substance that you are allergic to, such as a nickel allergy or poison ivy. This type only occurs if you have been exposed to the substance (allergen) before. Upon a repeat exposure, your body reacts to the substance. This type is less common. CAUSES  Many different substances can cause contact dermatitis. Irritant contact dermatitis is most commonly caused by exposure to:   Makeup.   Soaps.   Detergents.   Bleaches.   Acids.   Metal salts, such as nickel.  Allergic contact dermatitis is most commonly caused by exposure to:   Poisonous plants.   Chemicals.   Jewelry.   Latex.   Medicines.   Preservatives in products, such as clothing.  RISK FACTORS This condition is more likely to develop in:   People who have jobs that expose them to irritants or allergens.  People who have certain medical conditions, such as asthma or eczema.  SYMPTOMS  Symptoms of this condition may occur anywhere on your body where the irritant has touched you or is touched by you. Symptoms include:  Dryness or flaking.   Redness.   Cracks.   Itching.   Pain or a burning feeling.   Blisters.  Drainage of small amounts of blood or clear fluid from skin cracks. With allergic contact dermatitis, there may also be swelling in areas such as the eyelids, mouth, or genitals.  DIAGNOSIS  This condition is diagnosed with a medical history and physical exam. A patch skin test  may be performed to help determine the cause. If the condition is related to your job, you may need to see an occupational medicine specialist. TREATMENT Treatment for this condition includes figuring out what caused the reaction and protecting your skin from further contact. Treatment may also include:   Steroid creams or ointments. Oral steroid medicines may be needed in more severe cases.  Antibiotics or antibacterial ointments, if a skin infection is present.  Antihistamine lotion or an antihistamine taken by mouth to ease itching.  A bandage (dressing). HOME CARE INSTRUCTIONS Skin Care  Moisturize your skin as needed.   Apply cool compresses to the affected areas.  Try taking a bath with:  Epsom salts. Follow the instructions on the packaging. You can get these at your local pharmacy or grocery store.  Baking soda. Pour a small amount into the bath as directed by your health care provider.  Colloidal oatmeal. Follow the instructions on the packaging. You can get this at your local pharmacy or grocery store.  Try applying baking soda paste to your skin. Stir water into baking soda until it reaches a paste-like consistency.  Do not scratch your skin.  Bathe less frequently, such as every other day.  Bathe in lukewarm water. Avoid using hot water. Medicines  Take or apply over-the-counter and prescription medicines only as told by your health care provider.   If you were prescribed an antibiotic medicine, take or apply your antibiotic as told by your health care provider. Do not stop using the   antibiotic even if your condition starts to improve. General Instructions  Keep all follow-up visits as told by your health care provider. This is important.  Avoid the substance that caused your reaction. If you do not know what caused it, keep a journal to try to track what caused it. Write down:  What you eat.  What cosmetic products you use.  What you drink.  What  you wear in the affected area. This includes jewelry.  If you were given a dressing, take care of it as told by your health care provider. This includes when to change and remove it. SEEK MEDICAL CARE IF:   Your condition does not improve with treatment.  Your condition gets worse.  You have signs of infection such as swelling, tenderness, redness, soreness, or warmth in the affected area.  You have a fever.  You have new symptoms. SEEK IMMEDIATE MEDICAL CARE IF:   You have a severe headache, neck pain, or neck stiffness.  You vomit.  You feel very sleepy.  You notice red streaks coming from the affected area.  Your bone or joint underneath the affected area becomes painful after the skin has healed.  The affected area turns darker.  You have difficulty breathing.   This information is not intended to replace advice given to you by your health care provider. Make sure you discuss any questions you have with your health care provider.   Document Released: 12/10/2000 Document Revised: 09/03/2015 Document Reviewed: 04/30/2015 Elsevier Interactive Patient Education 2016 [employer]  

## 2016-05-30 NOTE — ED Notes (Signed)
Pt c/o itching and burning to scabs around neck, redness noted with some rash around the area. Pt denies any new med use. States she was outside yesterday working in yard. Denies any pain or fever at home

## 2016-05-30 NOTE — ED Notes (Signed)
Pt reports itching and burning to scabes on neck. States she noticed it yesterday.

## 2016-06-03 ENCOUNTER — Telehealth: Payer: Self-pay | Admitting: Family Medicine

## 2016-06-03 ENCOUNTER — Ambulatory Visit (INDEPENDENT_AMBULATORY_CARE_PROVIDER_SITE_OTHER): Payer: Medicare Other | Admitting: Psychiatry

## 2016-06-03 VITALS — BP 125/83 | HR 82 | Temp 97.4°F | Ht 65.0 in | Wt 197.4 lb

## 2016-06-03 DIAGNOSIS — F331 Major depressive disorder, recurrent, moderate: Secondary | ICD-10-CM

## 2016-06-03 DIAGNOSIS — F3481 Disruptive mood dysregulation disorder: Secondary | ICD-10-CM

## 2016-06-03 MED ORDER — BUSPIRONE HCL 10 MG PO TABS: 10.0000 mg | ORAL_TABLET | Freq: Two times a day (BID) | ORAL | Status: DC

## 2016-06-03 MED ORDER — ESCITALOPRAM OXALATE 5 MG PO TABS: 5.0000 mg | ORAL_TABLET | Freq: Every day | ORAL | Status: DC

## 2016-06-03 MED ORDER — QUETIAPINE FUMARATE 25 MG PO TABS: 50.0000 mg | ORAL_TABLET | Freq: Every day | ORAL | Status: DC

## 2016-06-03 NOTE — Progress Notes (Signed)
Psychiatric MD Progress NOTE  Patient Identification: Kaylee Wolf MRN:  [id number] Date of Evaluation:  06/03/2016 Referral Source: Primary care physician Chief Complaint:    Visit Diagnosis:    ICD-9-CM ICD-10-CM   1. Disruptive mood dysregulation disorder ([place]) 296.99 F34.81   2. MDD (major depressive disorder), recurrent episode, moderate (HCC) 296.32 F33.1    Diagnosis:   Patient Active Problem List   Diagnosis Date Noted  . 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 Mixed hyperlipidemia [E78.2] 10/24/2015  . Recurrent major depressive disorder ([place]) [F33.9] 10/24/2015  . Varicose veins [I86.8] 10/24/2015  . Seizures ([place]) [R56.9] 10/24/2015  . Tinea pedis of both feet [B35.3] 10/24/2015  . Insomnia [G47.00] 10/24/2015  . RLS (restless legs syndrome) [G25.81] 10/24/2015  . Aggrieved [F43.21] 01/30/2013  . Clinical depression [F32.9] 11/16/2012  . Avitaminosis D [E55.9] 11/13/2012  . HLD (hyperlipidemia) [E78.5] 08/14/2012  . Seizure ([place]) [R56.9] 04/20/2012  . Diabetes mellitus ([place]) [E11.9] 04/19/2012   History of Present Illness:    Patient is a 65 year old widowed female who presented for follow-up accompanied by her daughter. She was referred by her primary care physician. Patient Continues to have mood swings and she reported that she has been losing temper often. Her daughter reported that she has been taking several medications as prescribed by her primary care physician and her neurologist. Her daughter has been trying to adjust her medications and keep an eye on them. Her daughter also reported that patient has episodes of low blood sugar and they are keeping candies with the patient as her blood sugar will bottom out quickly. We have discussed about her blood sugar during her previous appointments related to the Seroquel but she reported that she has  episodes of hypoglycemia. She is going to call her PCP to discuss about the doses of metformin. Patient reported that she will try to walk out when she will become agitated so she does not get angry. She has been compliant with her medications. She reported that she does not remember the dosages and her daughter has been fixing her pill box on a regular basis.    Her daughter remains supportive. Patient is also acknowledging her symptoms and is willing to have her medications adjusted at this time. She currently denied using any drugs or alcohol. She has been sleeping well with the help of Seroquel and is willing to have her medications adjusted. .   Elements:  Severity:  moderate. Associated Signs/Symptoms: Depression Symptoms:  depressed mood, anhedonia, psychomotor agitation, fatigue, difficulty concentrating, anxiety, decreased appetite, (Hypo) Manic Symptoms:  Distractibility, Flight of Ideas, Impulsivity, Irritable Mood, Anxiety Symptoms:  Excessive Worry, Psychotic Symptoms:  none PTSD Symptoms: Negative NA  Past Medical History:  Past Medical History  Diagnosis Date  . Type 2 diabetes mellitus ([place])   . Mixed hyperlipidemia   . Recurrent major depressive disorder ([place])   . Varicose veins   . Seizures ([place])   . Tinea pedis of both feet   . RLS (restless legs syndrome)   . Insomnia   . Anxiety     Past Surgical History  Procedure Laterality Date  . None     Family History:  Family History  Problem Relation Age of Onset  . Diabetes Mother   . Heart disease Mother   . Cancer Father    Social History:   Social History   Social History  .  Marital Status: Widowed    Spouse Name: N/A  . Number of Children: N/A  . Years of Education: N/A   Social History Main Topics  . Smoking status: Never Smoker   . Smokeless tobacco: Never Used  . Alcohol Use: No  . Drug Use: No  . Sexual Activity: Not Currently   Other Topics Concern  . Not on file   Social  History Narrative   Additional Social History:  Patient is currently widowed and lives by herself  Musculoskeletal: Strength & Muscle Tone: within normal limits Gait & Station: normal Patient leans: N/A  Psychiatric Specialty Exam: HPI  ROS  Blood pressure 125/83, pulse 82, temperature 97.4 F (36.3 C), height '5\' 5"'  (1.651 m), weight 197 lb 6.4 oz (89.54 kg), SpO2 93 %.Body mass index is 32.85 kg/(m^2).  General Appearance: Casual and Fairly Groomed  Eye Contact:  Fair  Speech:  Clear and Coherent and Normal Rate  Volume:  Normal  Mood:  Anxious and Depressed  Affect:  Congruent  Thought Process:  Coherent  Orientation:  Full (Time, Place, and Person)  Thought Content:  WDL  Suicidal Thoughts:  No  Homicidal Thoughts:  No  Memory:  Immediate;   Fair  Judgement:  Intact  Insight:  Fair  Psychomotor Activity:  Decreased  Concentration:  Fair  Recall:  [employer] of Knowledge:Fair  Language: Fair  Akathisia:  No  Handed:  Right  AIMS (if indicated):    Assets:  Communication Skills Desire for Improvement Social Support Transportation  ADL's:  Intact  Cognition: WNL  Sleep:     Is the patient at risk to self?  No. Has the patient been a risk to self in the past 6 months?  No. Has the patient been a risk to self within the distant past?  No. Is the patient a risk to others?  No. Has the patient been a risk to others in the past 6 months?  No. Has the patient been a risk to others within the distant past?  No.  Allergies:  No Known Allergies Current Medications: Current Outpatient Prescriptions  Medication Sig Dispense Refill  . aspirin 81 MG tablet Take 81 mg by mouth daily.    . blood glucose meter kit and supplies KIT Dispense based on patient and insurance preference. Check blood glucose once daily. 1 each 0  . blood glucose meter kit and supplies Dispense based on patient and insurance preference. Use up to four times daily as directed. (FOR ICD-9 250.00,  250.01). 1 each 0  . busPIRone (BUSPAR) 5 MG tablet Take 1 tablet (5 mg total) by mouth 2 (two) times daily. 60 tablet 1  . Cholecalciferol (VITAMIN D-3) 1000 UNITS CAPS Take 1 capsule by mouth daily.     . clotrimazole (LOTRIMIN) 1 % cream Apply 1 application topically 2 (two) times daily.    [name] dexamethasone (DECADRON) 2 MG tablet Take 6 tablets on Day 1 with food, then decrease by 1 tablet daily until finished (6,5,4,3,2,1) 21 tablet 0  . divalproex (DEPAKOTE) 125 MG DR tablet     . gabapentin (NEURONTIN) 100 MG capsule Take 2 caps twice a day and 3 at bedtime. 270 capsule 6  . hydrOXYzine (VISTARIL) 25 MG capsule Take 1 capsule (25 mg total) by mouth 3 (three) times daily as needed. 21 capsule 0  . lamoTRIgine (LAMICTAL) 100 MG tablet Take 125 mg by mouth daily.     [name] lisinopril (PRINIVIL,ZESTRIL) 2.5 MG tablet Take 1  tablet (2.5 mg total) by mouth daily. 90 tablet 3  . lovastatin (MEVACOR) 20 MG tablet TAKE TWO TABLETS BY MOUTH ONCE DAILY WITH DINNER 60 tablet 1  . meloxicam (MOBIC) 15 MG tablet Take 15 mg by mouth daily.    . metFORMIN (GLUCOPHAGE) 500 MG tablet TAKE ONE TABLET BY MOUTH TWICE DAILY 60 tablet 2  . mupirocin ointment (BACTROBAN) 2 % Apply 1 application topically 2 (two) times daily. 30 g 0  . phenytoin (DILANTIN) 200 MG ER capsule Take 200 mg by mouth 2 (two) times daily.     . QUEtiapine (SEROQUEL) 25 MG tablet Take 3 tablets (75 mg total) by mouth at bedtime. 90 tablet 1  . rOPINIRole (REQUIP) 0.25 MG tablet TAKE TWO TABLETS BY MOUTH AT NIGHT FOR RESTLESS LEG     No current facility-administered medications for this visit.    Previous Psychotropic Medications:no  Substance Abuse History in the last 12 months:  No.  Consequences of Substance Abuse: Negative NA  Medical Decision Making:  Review of Psycho-Social Stressors (1)  Treatment Plan Summary: Medication management   Discussed with patient what the medications treatment risks benefits and  alternatives.  Will change seroquel 50  mg daily at bedtime for the anxiety and mood symptoms Increase BuSpar 10  mg twice a day for impulsive behavior.  Add Lexapro 40m daily.   Also advised her to start taking vitamins including vitamin D and vitamin E  Follow-up in 6 weeks.    More than 50% of the time spent in psychoeducation, counseling and coordination of care.    This note was generated in part or whole with voice recognition software. Voice regonition is usually quite accurate but there are transcription errors that can and very often do occur. I apologize for any typographical errors that were not detected and corrected.   URainey Pines MD  6/8/201712:27 PM

## 2016-06-03 NOTE — Telephone Encounter (Signed)
Patient's daughter states blood sugars have been dropping at least once weekly in 79s. She only takes Metformin bid. We have scheduled a f/u for next week. Is there anything else you would like to try?

## 2016-06-03 NOTE — Telephone Encounter (Signed)
Metformin doesn't usually drop sugars. It is probably food related. Is she eating regular meals? Try eating well rounded meals- protein, fat, and carbs. She can try stopping metformin to see if symptoms improve. Thanks! AK

## 2016-06-03 NOTE — Telephone Encounter (Signed)
[name] said pt's blood sugar is dropping and wanted to be see this afternoon because she can't get her here tomorrow morning.  Please call [name] at [phone]

## 2016-06-04 NOTE — Telephone Encounter (Signed)
Left detailed message as stated in previous message.Kaylee Wolf

## 2016-06-07 ENCOUNTER — Ambulatory Visit (INDEPENDENT_AMBULATORY_CARE_PROVIDER_SITE_OTHER): Payer: Medicare Other | Admitting: Family Medicine

## 2016-06-07 ENCOUNTER — Encounter: Payer: Self-pay | Admitting: Family Medicine

## 2016-06-07 VITALS — BP 149/80 | HR 88 | Temp 98.2°F | Resp 16 | Ht 65.0 in | Wt 196.0 lb

## 2016-06-07 DIAGNOSIS — B349 Viral infection, unspecified: Secondary | ICD-10-CM

## 2016-06-07 DIAGNOSIS — E785 Hyperlipidemia, unspecified: Secondary | ICD-10-CM

## 2016-06-07 DIAGNOSIS — R110 Nausea: Secondary | ICD-10-CM

## 2016-06-07 DIAGNOSIS — E1142 Type 2 diabetes mellitus with diabetic polyneuropathy: Secondary | ICD-10-CM | POA: Diagnosis not present

## 2016-06-07 LAB — CBC WITH DIFFERENTIAL/PLATELET
BASOS PCT: 1 %
Basophils Absolute: 39 cells/uL (ref 0–200)
EOS ABS: 117 {cells}/uL (ref 15–500)
Eosinophils Relative: 3 %
HEMATOCRIT: 43.5 % (ref 35.0–45.0)
Hemoglobin: 14.7 g/dL (ref 11.7–15.5)
Lymphocytes Relative: 31 %
Lymphs Abs: 1209 cells/uL (ref 850–3900)
MCH: 30.2 pg (ref 27.0–33.0)
MCHC: 33.8 g/dL (ref 32.0–36.0)
MCV: 89.3 fL (ref 80.0–100.0)
MONO ABS: 468 {cells}/uL (ref 200–950)
MONOS PCT: 12 %
MPV: 9.6 fL (ref 7.5–12.5)
NEUTROS ABS: 2067 {cells}/uL (ref 1500–7800)
Neutrophils Relative %: 53 %
PLATELETS: 210 10*3/uL (ref 140–400)
RBC: 4.87 MIL/uL (ref 3.80–5.10)
RDW: 14.3 % (ref 11.0–15.0)
WBC: 3.9 10*3/uL (ref 3.8–10.8)

## 2016-06-07 LAB — POCT GLYCOSYLATED HEMOGLOBIN (HGB A1C): Hemoglobin A1C: 7.1

## 2016-06-07 MED ORDER — ONDANSETRON 4 MG PO TBDP: 4.0000 mg | ORAL_TABLET | Freq: Two times a day (BID) | ORAL | Status: DC

## 2016-06-07 NOTE — Patient Instructions (Addendum)
I think your symptoms are viral related. I have given you some Zofran to take as needed for nausea. Drink plenty of fluids and get plenty of rest.    We will treat your nausea today. You can take zofran every 12 hours as needed for nausea. This medication can make you sleepy.  We will treat your symptoms with supportive care: Ensure you are drinking plenty of fluids- I recommend diluting gatorade 2oz to 6 oz water or drinking chicken broth for electroyltes and to boost your blood sugar. Start with bland meals when you feel up to it. Drinking is the most important- you do not need to eat if you don't feel well. Try peptobismal to help with your symptoms.  IF you feel short of breath, have chest pain, feel faint, have severe abdominal pain, are unable to keep down medications, please go to ER.   Please check your blood glucose 1 times daily. If your glucose is < 70 mg/dl or you have symptoms of hypoglycemia confusion, dizziness, headache, jitteriness and seizures please drink 4 oz of juice or soda.  Check blood glucose 15 minutes later. If it has not risen to >100, please seek medical attention. If > 100 please eat a snack containing protein such as peanut butter and crackers.  Walk 10 minutes after each meal to exercise. Watch the concentrated- no ice cream, no soda. Reduce breads, rice, pasta. Make sure you are eating well rounded meals- protein and fat with eat meal.

## 2016-06-07 NOTE — Progress Notes (Signed)
Subjective:    Patient ID: Kaylee Wolf, female    DOB: 1951/12/07, 65 y.o.   MRN: [id number]  HPI: Kaylee Wolf is a 65 y.o. female presenting on 06/07/2016 for Nausea   HPI Pt presents for nausea and not feeling well. Symptoms started yesterday. Per family she felt warm last night. Daughter was ill with similar illness- nauseated, feeling weak.  Current symptoms: Nausea, weakness, no emesis or diarrhea. Some sneezing. No coughing. Took tylenol- made her feel better. No burning when she urinates. Feels tired- wants to sleep. Poor appetite to due to nausea. Able to keep fluids down. No chest pain. No shortness of breath. No trouble breathing. Pt is due for diabetes follow-up. Pt daughter reports she has been having low blood sugars. Only checked one time and it was 40. Treated with ice cream. Low blood sugars have been occurring over past 3 mos. Pt reports when she had the documented low BG she had not eaten all day prior to sugar- had just finished dinner and still felt bad. She is taking metformin only twice daily. Family reports she feels bad but is not checking sugars. She is not eating regular meals.   Past Medical History  Diagnosis Date  . Type 2 diabetes mellitus ([place])   . Mixed hyperlipidemia   . Recurrent major depressive disorder ([place])   . Varicose veins   . Seizures ([place])   . Tinea pedis of both feet   . RLS (restless legs syndrome)   . Insomnia   . Anxiety     Current Outpatient Prescriptions on File Prior to Visit  Medication Sig  . aspirin 81 MG tablet Take 81 mg by mouth daily.  . blood glucose meter kit and supplies KIT Dispense based on patient and insurance preference. Check blood glucose once daily.  . blood glucose meter kit and supplies Dispense based on patient and insurance preference. Use up to four times daily as directed. (FOR ICD-9 250.00, 250.01).  . busPIRone (BUSPAR) 10 MG tablet Take 1 tablet (10 mg total) by mouth 2 (two) times daily.  .  Cholecalciferol (VITAMIN D-3) 1000 UNITS CAPS Take 1 capsule by mouth daily.   . clotrimazole (LOTRIMIN) 1 % cream Apply 1 application topically 2 (two) times daily. Reported on 06/07/2016  . divalproex (DEPAKOTE) 125 MG DR tablet   . escitalopram (LEXAPRO) 5 MG tablet Take 1 tablet (5 mg total) by mouth daily.  [name] gabapentin (NEURONTIN) 100 MG capsule Take 2 caps twice a day and 3 at bedtime.  . hydrOXYzine (VISTARIL) 25 MG capsule Take 1 capsule (25 mg total) by mouth 3 (three) times daily as needed.  . lamoTRIgine (LAMICTAL) 100 MG tablet Take 125 mg by mouth daily.   [name] lisinopril (PRINIVIL,ZESTRIL) 2.5 MG tablet Take 1 tablet (2.5 mg total) by mouth daily.  [name] lovastatin (MEVACOR) 20 MG tablet TAKE TWO TABLETS BY MOUTH ONCE DAILY WITH DINNER  . meloxicam (MOBIC) 15 MG tablet Take 15 mg by mouth as needed. Reported on 06/07/2016  . metFORMIN (GLUCOPHAGE) 500 MG tablet TAKE ONE TABLET BY MOUTH TWICE DAILY  . phenytoin (DILANTIN) 200 MG ER capsule Take 200 mg by mouth 2 (two) times daily.   . QUEtiapine (SEROQUEL) 25 MG tablet Take 2 tablets (50 mg total) by mouth at bedtime.  [name] rOPINIRole (REQUIP) 0.25 MG tablet TAKE TWO TABLETS BY MOUTH AT NIGHT FOR RESTLESS LEG   No current facility-administered medications on file prior to visit.    Review  of Systems  Constitutional: Positive for activity change and fatigue. Negative for fever and chills.  HENT: Positive for sneezing. Negative for congestion, ear pain and trouble swallowing.   Respiratory: Negative for cough, chest tightness and wheezing.   Cardiovascular: Negative for chest pain and leg swelling.  Gastrointestinal: Positive for nausea. Negative for vomiting, abdominal pain, diarrhea and constipation.  Endocrine: Negative.  Negative for cold intolerance, heat intolerance, polydipsia, polyphagia and polyuria.  Genitourinary: Negative for dysuria and difficulty urinating.  Musculoskeletal: Negative.   Neurological: Positive for weakness.  Negative for dizziness, light-headedness and numbness.  Psychiatric/Behavioral: Negative.    Per HPI unless specifically indicated above     Objective:    BP 149/80 mmHg  Pulse 88  Temp(Src) 98.2 F (36.8 C) (Oral)  Resp 16  Ht _0  (1.651 m)  Wt 196 lb (88.905 kg)  BMI 32.62 kg/m2  Wt Readings from Last 3 Encounters:  06/07/16 196 lb (88.905 kg)  06/03/16 197 lb 6.4 oz (89.54 kg)  05/30/16 200 lb (90.719 kg)    Physical Exam  Constitutional: She is oriented to person, place, and time. She appears well-developed and well-nourished.  HENT:  Head: Normocephalic and atraumatic.  Neck: Neck supple.  Cardiovascular: Normal rate, regular rhythm and normal heart sounds.  Exam reveals no gallop and no friction rub.   No murmur heard. Pulmonary/Chest: Effort normal and breath sounds normal. She has no wheezes. She exhibits no tenderness.  Abdominal: Soft. Normal appearance and bowel sounds are normal. She exhibits no distension and no mass. There is no tenderness. There is no rebound and no guarding.  Musculoskeletal: Normal range of motion. She exhibits no edema or tenderness.  Lymphadenopathy:    She has no cervical adenopathy.  Neurological: She is alert and oriented to person, place, and time.  Skin: Skin is warm and dry.   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 However due to reported low blood sugars- will make no adjustments today. Plan for 1 mos of monitoring sugar- will check when pt feels symptoms. Encouraged 3 meals per day including protein fat. Check CMP.  Reviewed hypoglycemia protocol. Recheck 1 mos.       Relevant Orders   COMPLETE METABOLIC PANEL WITH GFR   POCT HgB A1C (Completed)     Other    HLD (hyperlipidemia)    Check lipid panel.       Relevant Orders   Lipid Profile    Other Visit Diagnoses    Viral syndrome    -  Primary    Symptoms likely evolving virus. Supportive care at home. Reviewed alarm symptoms. Return if not improving.     Relevant Orders    CBC with Differential/Platelet    Nausea        PRN zofran to help with nausea. Reviewed alarm symptoms. Return if not improving.     Relevant Medications    ondansetron (ZOFRAN-ODT) 4 MG disintegrating tablet       Meds ordered this encounter  Medications  . ondansetron (ZOFRAN-ODT) 4 MG disintegrating tablet    Sig: Take 1 tablet (4 mg total) by mouth 2 (two) times daily.  Dispense:  20 tablet    Refill:  0    Order Specific Question:  Supervising Provider    Answer:  Arlis Porta [[id number]]      Follow up plan: Return in about 4 weeks (around 07/05/2016) for Diabetes. [name]

## 2016-06-07 NOTE — Assessment & Plan Note (Signed)
Check lipid panel  

## 2016-06-07 NOTE — Assessment & Plan Note (Addendum)
A1c is elevated today. However due to reported low blood sugars- will make no adjustments today. Plan for 1 mos of monitoring sugar- will check when pt feels symptoms. Encouraged 3 meals per day including protein fat. Check CMP.  Reviewed hypoglycemia protocol. Recheck 1 mos.

## 2016-06-08 LAB — COMPLETE METABOLIC PANEL WITH GFR
ALBUMIN: 4.2 g/dL (ref 3.6–5.1)
ALK PHOS: 120 U/L (ref 33–130)
ALT: 10 U/L (ref 6–29)
AST: 12 U/L (ref 10–35)
BILIRUBIN TOTAL: 0.3 mg/dL (ref 0.2–1.2)
BUN: 11 mg/dL (ref 7–25)
CALCIUM: 9.5 mg/dL (ref 8.6–10.4)
CO2: 27 mmol/L (ref 20–31)
CREATININE: 0.81 mg/dL (ref 0.50–0.99)
Chloride: 103 mmol/L (ref 98–110)
GFR, Est African American: 89 mL/min (ref >=60)
GFR, Est Non African American: 78 mL/min (ref >=60)
Glucose, Bld: 177 mg/dL — ABNORMAL HIGH (ref 65–99)
Potassium: 4.7 mmol/L (ref 3.5–5.3)
Sodium: 140 mmol/L (ref 135–146)
Total Protein: 6.6 g/dL (ref 6.1–8.1)

## 2016-06-08 LAB — LIPID PANEL
CHOLESTEROL: 204 mg/dL — AB (ref 125–200)
HDL: 74 mg/dL (ref >=46)
LDL Cholesterol: 108 mg/dL (ref <130)
Total CHOL/HDL Ratio: 2.8 Ratio (ref <=5.0)
Triglycerides: 109 mg/dL (ref <150)
VLDL: 22 mg/dL (ref <30)

## 2016-06-09 ENCOUNTER — Ambulatory Visit: Payer: Self-pay | Admitting: Family Medicine

## 2016-06-23 ENCOUNTER — Other Ambulatory Visit: Payer: Self-pay | Admitting: Family Medicine

## 2016-06-25 ENCOUNTER — Telehealth: Payer: Self-pay | Admitting: Family Medicine

## 2016-06-25 ENCOUNTER — Other Ambulatory Visit: Payer: Self-pay | Admitting: Family Medicine

## 2016-06-25 NOTE — Telephone Encounter (Signed)
[name] tried to refill pt's lovastatin and it was denied.  Pt takes one 20 mg talblet twice daily.  Amy also talked about changing the dosage and she asked if she needed to schedule an apt before the July 18th apt she already has.  Her call back number is [phone] and leave a vm.

## 2016-06-25 NOTE — Telephone Encounter (Signed)
LMTCB

## 2016-06-28 MED ORDER — ATORVASTATIN CALCIUM 20 MG PO TABS: 20.0000 mg | ORAL_TABLET | Freq: Every day | ORAL | Status: DC

## 2016-06-28 NOTE — Telephone Encounter (Signed)
I will just go ahead and change her to atorvastatin 20mg  once daily. Sent to pharmacy on file. Please alert the patient.

## 2016-06-28 NOTE — Telephone Encounter (Signed)
[name] has been notified.[place]

## 2016-07-12 ENCOUNTER — Other Ambulatory Visit: Payer: Self-pay | Admitting: Psychiatry

## 2016-07-12 ENCOUNTER — Ambulatory Visit (INDEPENDENT_AMBULATORY_CARE_PROVIDER_SITE_OTHER): Payer: Medicare Other | Admitting: Family Medicine

## 2016-07-12 VITALS — BP 120/62 | HR 87 | Temp 98.6°F | Resp 16 | Ht 65.0 in | Wt 186.0 lb

## 2016-07-12 DIAGNOSIS — E785 Hyperlipidemia, unspecified: Secondary | ICD-10-CM | POA: Diagnosis not present

## 2016-07-12 DIAGNOSIS — E1142 Type 2 diabetes mellitus with diabetic polyneuropathy: Secondary | ICD-10-CM

## 2016-07-12 MED ORDER — ALPHA-LIPOIC ACID 600 MG PO CAPS
1.0000 | ORAL_CAPSULE | Freq: Every day | ORAL | Status: DC
Start: 2016-07-12 — End: 2017-09-06

## 2016-07-12 NOTE — Patient Instructions (Signed)
Keep up the good work on your diabetes. I think the diet changes will be helpful.  American Diabetes association: Recommends 10 minutes of walking after each meal. Make your goal 5 minutes after every meal.   Gabapentin: Week 1: Take 2 AM and 1 PM Week 2: Take 1 AM and 1 PM Week 3: Take 1 AM and 0 PM Week 4: Stop.  Take alpha lipoic acid at anytime.

## 2016-07-12 NOTE — Assessment & Plan Note (Signed)
No changes to medication. Continue diet and lifestyle changes. Refer for eye exam. Foot exam done- reduced feeling R toes. Reviewed diabetic foot care. UA micro- due 12/2016.  Recheck 3 mos.  Change to alpha lipoic acid for neuropathy.

## 2016-07-12 NOTE — Assessment & Plan Note (Signed)
Changed to atorvastatin for higher- intensity statin. Recheck next visit.

## 2016-07-12 NOTE — Progress Notes (Signed)
Subjective:    Patient ID: Kaylee Wolf, female    DOB: August 13, 1951, 65 y.o.   MRN: [id number]  HPI: Kaylee Wolf is a 65 y.o. female presenting on 07/12/2016 for Diabetes   HPI  Pt presents for diabetes follow-up. Daughter has changed her diet. Cut out pasta. Little bit of bread- whole grain. No fried foods. Grilled or baked only. She was having hypoglycemia episodes but they resolved with diet changes. Snacks on fresh fruits and veggies. Taking metformin twice daily.  Neuropathy- numb in the toes. Worsening.  Needs eye exam.  Last TDAP unsure.   Past Medical History  Diagnosis Date  . Type 2 diabetes mellitus ([place])   . Mixed hyperlipidemia   . Recurrent major depressive disorder ([place])   . Varicose veins   . Seizures ([place])   . Tinea pedis of both feet   . RLS (restless legs syndrome)   . Insomnia   . Anxiety     Current Outpatient Prescriptions on File Prior to Visit  Medication Sig  . aspirin 81 MG tablet Take 81 mg by mouth daily.  [name] atorvastatin (LIPITOR) 20 MG tablet Take 1 tablet (20 mg total) by mouth daily.  . blood glucose meter kit and supplies KIT Dispense based on patient and insurance preference. Check blood glucose once daily.  . blood glucose meter kit and supplies Dispense based on patient and insurance preference. Use up to four times daily as directed. (FOR ICD-9 250.00, 250.01).  . busPIRone (BUSPAR) 10 MG tablet Take 1 tablet (10 mg total) by mouth 2 (two) times daily.  . Cholecalciferol (VITAMIN D-3) 1000 UNITS CAPS Take 1 capsule by mouth daily.   . clotrimazole (LOTRIMIN) 1 % cream Apply 1 application topically 2 (two) times daily. Reported on 06/07/2016  . divalproex (DEPAKOTE) 125 MG DR tablet   . escitalopram (LEXAPRO) 5 MG tablet Take 1 tablet (5 mg total) by mouth daily.  [name] gabapentin (NEURONTIN) 100 MG capsule Take 2 caps twice a day and 3 at bedtime.  . hydrOXYzine (VISTARIL) 25 MG capsule Take 1 capsule (25 mg total) by mouth 3 (three)  times daily as needed.  . lamoTRIgine (LAMICTAL) 100 MG tablet Take 125 mg by mouth daily.   [name] lisinopril (PRINIVIL,ZESTRIL) 2.5 MG tablet Take 1 tablet (2.5 mg total) by mouth daily.  . meloxicam (MOBIC) 15 MG tablet Take 15 mg by mouth as needed. Reported on 06/07/2016  . metFORMIN (GLUCOPHAGE) 500 MG tablet TAKE ONE TABLET BY MOUTH TWICE DAILY  . ondansetron (ZOFRAN-ODT) 4 MG disintegrating tablet Take 1 tablet (4 mg total) by mouth 2 (two) times daily.  . phenytoin (DILANTIN) 200 MG ER capsule Take 200 mg by mouth 2 (two) times daily.   . QUEtiapine (SEROQUEL) 25 MG tablet Take 2 tablets (50 mg total) by mouth at bedtime.  [name] rOPINIRole (REQUIP) 0.25 MG tablet TAKE TWO TABLETS BY MOUTH AT NIGHT FOR RESTLESS LEG   No current facility-administered medications on file prior to visit.    Review of Systems  Constitutional: Negative for fever and chills.  HENT: Negative.   Respiratory: Negative for cough, chest tightness and wheezing.   Cardiovascular: Negative for chest pain and leg swelling.  Gastrointestinal: Negative for nausea, vomiting, abdominal pain, diarrhea and constipation.  Endocrine: Negative.  Negative for cold intolerance, heat intolerance, polydipsia, polyphagia and polyuria.  Genitourinary: Negative for dysuria and difficulty urinating.  Musculoskeletal: Negative.   Neurological: Negative for dizziness, light-headedness and numbness.  Psychiatric/Behavioral: Negative.  Per HPI unless specifically indicated above     Objective:    BP 120/62 mmHg  Pulse 87  Temp(Src) 98.6 F (37 C) (Oral)  Resp 16  Ht '5\' 5"'  (1.651 m)  Wt 186 lb (84.369 kg)  BMI 30.95 kg/m2  Wt Readings from Last 3 Encounters:  07/12/16 186 lb (84.369 kg)  06/07/16 196 lb (88.905 kg)  06/03/16 197 lb 6.4 oz (89.54 kg)    Physical Exam  Constitutional: She is oriented to person, place, and time. She appears well-developed and well-nourished.  HENT:  Head: Normocephalic and atraumatic.  Neck:  Neck supple.  Cardiovascular: Normal rate, regular rhythm and normal heart sounds.  Exam reveals no gallop and no friction rub.   No murmur heard. Pulmonary/Chest: Effort normal and breath sounds normal. She has no wheezes. She exhibits no tenderness.  Abdominal: Soft. Normal appearance and bowel sounds are normal. She exhibits no distension and no mass. There is no tenderness. There is no rebound and no guarding.  Musculoskeletal: Normal range of motion. She exhibits no edema or tenderness.  Lymphadenopathy:    She has no cervical adenopathy.  Neurological: She is alert and oriented to person, place, and time.  Skin: Skin is warm and dry.   Diabetic Foot Exam - Simple   Simple Foot Form  Diabetic Foot exam was performed with the following findings:  Yes 07/12/2016  9:30 AM  Visual Inspection  Sensation Testing  Pulse Check  Comments  Diminished sensation to monofilament all toes R foot.       Results for orders placed or performed in visit on 06/07/16  COMPLETE METABOLIC PANEL WITH GFR  Result Value Ref Range   Sodium 140 135 - 146 mmol/L   Potassium 4.7 3.5 - 5.3 mmol/L   Chloride 103 98 - 110 mmol/L   CO2 27 20 - 31 mmol/L   Glucose, Bld 177 (H) 65 - 99 mg/dL   BUN 11 7 - 25 mg/dL   Creat 0.81 0.50 - 0.99 mg/dL   Total Bilirubin 0.3 0.2 - 1.2 mg/dL   Alkaline Phosphatase 120 33 - 130 U/L   AST 12 10 - 35 U/L   ALT 10 6 - 29 U/L   Total Protein 6.6 6.1 - 8.1 g/dL   Albumin 4.2 3.6 - 5.1 g/dL   Calcium 9.5 8.6 - 10.4 mg/dL   GFR, Est African American 89 >=60 mL/min   GFR, Est Non African American 78 >=60 mL/min  Lipid Profile  Result Value Ref Range   Cholesterol 204 (H) 125 - 200 mg/dL   Triglycerides 109 <150 mg/dL   HDL 74 >=46 mg/dL   Total CHOL/HDL Ratio 2.8 <=5.0 Ratio   VLDL 22 <30 mg/dL   LDL Cholesterol 108 <130 mg/dL  CBC with Differential/Platelet  Result Value Ref Range   WBC 3.9 3.8 - 10.8 K/uL   RBC 4.87 3.80 - 5.10 MIL/uL   Hemoglobin 14.7 11.7 -  15.5 g/dL   HCT 43.5 35.0 - 45.0 %   MCV 89.3 80.0 - 100.0 fL   MCH 30.2 27.0 - 33.0 pg   MCHC 33.8 32.0 - 36.0 g/dL   RDW 14.3 11.0 - 15.0 %   Platelets 210 140 - 400 K/uL   MPV 9.6 7.5 - 12.5 fL   Neutro Abs 2067 1500 - 7800 cells/uL   Lymphs Abs 1209 850 - 3900 cells/uL   Monocytes Absolute 468 200 - 950 cells/uL   Eosinophils Absolute 117 15 - 500 cells/uL  Basophils Absolute 39 0 - 200 cells/uL   Neutrophils Relative % 53 %   Lymphocytes Relative 31 %   Monocytes Relative 12 %   Eosinophils Relative 3 %   Basophils Relative 1 %   Smear Review Criteria for review not met   POCT HgB A1C  Result Value Ref Range   Hemoglobin A1C 7.1%       Assessment & Plan:   Problem List Items Addressed This Visit      Endocrine   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 Continue diet and lifestyle changes. Refer for eye exam. Foot exam done- reduced feeling R toes. Reviewed diabetic foot care. UA micro- due 12/2016.  Recheck 3 mos.  Change to alpha lipoic acid for neuropathy.       Relevant Medications   Alpha-Lipoic Acid 600 MG CAPS   Other Relevant Orders   Ambulatory referral to Ophthalmology     Other   HLD (hyperlipidemia)    Changed to atorvastatin for higher- intensity statin. Recheck next visit.          Meds ordered this encounter  Medications  . Alpha-Lipoic Acid 600 MG CAPS    Sig: Take 1 capsule (600 mg total) by mouth daily.    Dispense:  30 each    Refill:  11    Order Specific Question:  Supervising Provider    Answer:  Arlis Porta [phone]      Follow up plan: Return in about 2 months (around 09/12/2016) for A1c due 9/13 or later. [name]

## 2016-07-13 ENCOUNTER — Ambulatory Visit: Payer: Self-pay | Admitting: Family Medicine

## 2016-07-15 ENCOUNTER — Ambulatory Visit: Payer: Medicare Other | Admitting: Psychiatry

## 2016-07-20 DIAGNOSIS — R252 Cramp and spasm: Secondary | ICD-10-CM | POA: Diagnosis not present

## 2016-07-20 DIAGNOSIS — R55 Syncope and collapse: Secondary | ICD-10-CM | POA: Diagnosis not present

## 2016-07-20 DIAGNOSIS — G40309 Generalized idiopathic epilepsy and epileptic syndromes, not intractable, without status epilepticus: Secondary | ICD-10-CM | POA: Diagnosis not present

## 2016-07-21 ENCOUNTER — Encounter: Payer: Self-pay | Admitting: *Deleted

## 2016-07-29 LAB — HM DIABETES EYE EXAM

## 2016-08-10 ENCOUNTER — Encounter: Payer: Self-pay | Admitting: Family Medicine

## 2016-08-17 ENCOUNTER — Other Ambulatory Visit: Payer: Self-pay | Admitting: Family Medicine

## 2016-08-17 MED ORDER — ATORVASTATIN CALCIUM 20 MG PO TABS: 20.0000 mg | ORAL_TABLET | Freq: Every day | ORAL | 3 refills | Status: DC

## 2016-08-22 ENCOUNTER — Encounter: Payer: Self-pay | Admitting: Emergency Medicine

## 2016-08-22 ENCOUNTER — Emergency Department: Payer: Medicare Other

## 2016-08-22 ENCOUNTER — Emergency Department
Admission: EM | Admit: 2016-08-22 | Discharge: 2016-08-22 | Disposition: A | Discharge status: Home / Self Care | Payer: Medicare Other | Attending: Emergency Medicine | Admitting: Emergency Medicine

## 2016-08-22 DIAGNOSIS — S0990XA Unspecified injury of head, initial encounter: Secondary | ICD-10-CM | POA: Insufficient documentation

## 2016-08-22 DIAGNOSIS — Y999 Unspecified external cause status: Secondary | ICD-10-CM | POA: Insufficient documentation

## 2016-08-22 DIAGNOSIS — Z7984 Long term (current) use of oral hypoglycemic drugs: Secondary | ICD-10-CM | POA: Insufficient documentation

## 2016-08-22 DIAGNOSIS — Z7982 Long term (current) use of aspirin: Secondary | ICD-10-CM | POA: Diagnosis not present

## 2016-08-22 DIAGNOSIS — W19XXXA Unspecified fall, initial encounter: Secondary | ICD-10-CM | POA: Diagnosis not present

## 2016-08-22 DIAGNOSIS — Y9389 Activity, other specified: Secondary | ICD-10-CM | POA: Diagnosis not present

## 2016-08-22 DIAGNOSIS — E119 Type 2 diabetes mellitus without complications: Secondary | ICD-10-CM | POA: Diagnosis not present

## 2016-08-22 DIAGNOSIS — R05 Cough: Secondary | ICD-10-CM | POA: Diagnosis not present

## 2016-08-22 DIAGNOSIS — W01198A Fall on same level from slipping, tripping and stumbling with subsequent striking against other object, initial encounter: Secondary | ICD-10-CM | POA: Insufficient documentation

## 2016-08-22 DIAGNOSIS — Y9289 Other specified places as the place of occurrence of the external cause: Secondary | ICD-10-CM | POA: Diagnosis not present

## 2016-08-22 DIAGNOSIS — Z79899 Other long term (current) drug therapy: Secondary | ICD-10-CM | POA: Insufficient documentation

## 2016-08-22 DIAGNOSIS — R42 Dizziness and giddiness: Secondary | ICD-10-CM | POA: Diagnosis not present

## 2016-08-22 LAB — CBC WITH DIFFERENTIAL/PLATELET
BASOS ABS: 0.1 10*3/uL (ref 0–0.1)
BASOS PCT: 1 %
Eosinophils Absolute: 0.1 10*3/uL (ref 0–0.7)
Eosinophils Relative: 2 %
HEMATOCRIT: 39.7 % (ref 35.0–47.0)
HEMOGLOBIN: 13.5 g/dL (ref 12.0–16.0)
Lymphocytes Relative: 26 %
Lymphs Abs: 1.1 10*3/uL (ref 1.0–3.6)
MCH: 30.5 pg (ref 26.0–34.0)
MCHC: 34.2 g/dL (ref 32.0–36.0)
MCV: 89.4 fL (ref 80.0–100.0)
MONOS PCT: 11 %
Monocytes Absolute: 0.5 10*3/uL (ref 0.2–0.9)
NEUTROS ABS: 2.6 10*3/uL (ref 1.4–6.5)
NEUTROS PCT: 60 %
Platelets: 193 10*3/uL (ref 150–440)
RBC: 4.44 MIL/uL (ref 3.80–5.20)
RDW: 14 % (ref 11.5–14.5)
WBC: 4.4 10*3/uL (ref 3.6–11.0)

## 2016-08-22 LAB — URINALYSIS COMPLETE WITH MICROSCOPIC (ARMC ONLY)
BACTERIA UA: NONE SEEN
BILIRUBIN URINE: NEGATIVE
Glucose, UA: NEGATIVE mg/dL
Ketones, ur: NEGATIVE mg/dL
NITRITE: NEGATIVE
PH: 7 (ref 5.0–8.0)
PROTEIN: NEGATIVE mg/dL
Specific Gravity, Urine: 1.011 (ref 1.005–1.030)

## 2016-08-22 LAB — COMPREHENSIVE METABOLIC PANEL
ALBUMIN: 3.9 g/dL (ref 3.5–5.0)
ALT: 20 U/L (ref 14–54)
ANION GAP: 5 (ref 5–15)
AST: 26 U/L (ref 15–41)
Alkaline Phosphatase: 118 U/L (ref 38–126)
BILIRUBIN TOTAL: 0.4 mg/dL (ref 0.3–1.2)
BUN: 12 mg/dL (ref 6–20)
CHLORIDE: 105 mmol/L (ref 101–111)
CO2: 31 mmol/L (ref 22–32)
Calcium: 8.7 mg/dL — ABNORMAL LOW (ref 8.9–10.3)
Creatinine, Ser: 0.7 mg/dL (ref 0.44–1.00)
GFR calc Af Amer: >60 mL/min (ref >60)
GFR calc non Af Amer: >60 mL/min (ref >60)
GLUCOSE: 105 mg/dL — AB (ref 65–99)
POTASSIUM: 3.9 mmol/L (ref 3.5–5.1)
SODIUM: 141 mmol/L (ref 135–145)
TOTAL PROTEIN: 6.6 g/dL (ref 6.5–8.1)

## 2016-08-22 LAB — TROPONIN I

## 2016-08-22 MED ORDER — SODIUM CHLORIDE 0.9 % IV BOLUS (SEPSIS)
1000.0000 mL | Freq: Once | INTRAVENOUS | Status: AC
Start: 2016-08-22 — End: 2016-08-22
  Administered 2016-08-22: 1000 mL via INTRAVENOUS

## 2016-08-22 NOTE — ED Provider Notes (Signed)
[facility] Emergency Department Provider Note   ____________________________________________   First MD Initiated Contact with Patient 08/22/16 1344     (approximate)  I have reviewed the triage vital signs and the nursing notes.   HISTORY  Chief Complaint Fall  Chief complaint fall  HPI Kaylee Wolf is a 64 y.o. female patient what reports she went to her grandsons room and on the way out she may have tripped she is not sure she fell backwards hit her head on the dresser and everything went black. She is not sure if she actually passed out however. She says she hasn't eaten anything all day long today because she is a: All her food taste bad. She got lightheaded and dizzy after falling. She reports she is coughing and coughing up some material she is not sure what it looks like. Her nose is stuffy. Again food doesn't taste good. This is been going on for several days. She just hit her head this morning however. Patient has no neck pain or back pain or any other pain.  Past Medical History:  Diagnosis Date  . Anxiety   . Insomnia   . Mixed hyperlipidemia   . Recurrent major depressive disorder ([place])   . RLS (restless legs syndrome)   . Seizures ([place])   . Tinea pedis of both feet   . Type 2 diabetes mellitus ([place])   . Varicose veins     Patient Active Problem List   Diagnosis Date Noted  . 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 Recurrent major depressive disorder ([place]) 10/24/2015  . Varicose veins 10/24/2015  . Seizures ([place]) 10/24/2015  . Tinea pedis of both feet 10/24/2015  . Insomnia 10/24/2015  . RLS (restless legs syndrome) 10/24/2015  . Aggrieved 01/30/2013  . Clinical depression 11/16/2012  . Avitaminosis D 11/13/2012  . HLD (hyperlipidemia) 08/14/2012  . Seizure ([place]) 04/20/2012  . Diabetes mellitus ([place]) 04/19/2012    Past  Surgical History:  Procedure Laterality Date  . none      Prior to Admission medications   Medication Sig Start Date End Date Taking? Authorizing Provider  Alpha-Lipoic Acid 600 MG CAPS Take 1 capsule (600 mg total) by mouth daily. 07/12/16   Amy Overton Mam, NP  aspirin 81 MG tablet Take 81 mg by mouth daily.    Historical Provider, MD  atorvastatin (LIPITOR) 20 MG tablet Take 1 tablet (20 mg total) by mouth daily. 08/17/16   Amy Overton Mam, NP  blood glucose meter kit and supplies KIT Dispense based on patient and insurance preference. Check blood glucose once daily. 11/25/15   Amy Overton Mam, NP  blood glucose meter kit and supplies Dispense based on patient and insurance preference. Use up to four times daily as directed. (FOR ICD-9 250.00, 250.01). 03/11/16   Arlis Porta., MD  busPIRone (BUSPAR) 10 MG tablet Take 1 tablet (10 mg total) by mouth 2 (two) times daily. 06/03/16   Rainey Pines, MD  Cholecalciferol (VITAMIN D-3) 1000 UNITS CAPS Take 1 capsule by mouth daily.     Historical Provider, MD  clotrimazole (LOTRIMIN) 1 % cream Apply 1 application topically 2 (two) times daily. Reported on 06/07/2016    Historical Provider, MD  divalproex (DEPAKOTE) 125 MG DR tablet  03/09/16   Historical Provider, MD  escitalopram (LEXAPRO) 5 MG tablet Take 1 tablet (5 mg total) by mouth daily.  06/03/16   Rainey Pines, MD  gabapentin (NEURONTIN) 100 MG capsule Take 2 caps twice a day and 3 at bedtime. 11/25/15   Amy Overton Mam, NP  hydrOXYzine (VISTARIL) 25 MG capsule Take 1 capsule (25 mg total) by mouth 3 (three) times daily as needed. 05/30/16   Jami L Hagler, PA-C  lamoTRIgine (LAMICTAL) 100 MG tablet Take 125 mg by mouth daily.  11/18/15   Historical Provider, MD  lisinopril (PRINIVIL,ZESTRIL) 2.5 MG tablet Take 1 tablet (2.5 mg total) by mouth daily. 11/25/15   Amy Overton Mam, NP  meloxicam (MOBIC) 15 MG tablet Take 15 mg by mouth as needed. Reported on 06/07/2016    Historical Provider, MD    metFORMIN (GLUCOPHAGE) 500 MG tablet TAKE ONE TABLET BY MOUTH TWICE DAILY 05/13/16   Amy Overton Mam, NP  ondansetron (ZOFRAN-ODT) 4 MG disintegrating tablet Take 1 tablet (4 mg total) by mouth 2 (two) times daily. 06/07/16   Amy Overton Mam, NP  phenytoin (DILANTIN) 200 MG ER capsule Take 200 mg by mouth 2 (two) times daily.  11/17/15   Historical Provider, MD  QUEtiapine (SEROQUEL) 25 MG tablet Take 2 tablets (50 mg total) by mouth at bedtime. 06/03/16   Rainey Pines, MD  rOPINIRole (REQUIP) 0.25 MG tablet TAKE TWO TABLETS BY MOUTH AT NIGHT FOR RESTLESS LEG 11/04/15   Historical Provider, MD    Allergies Review of patient's allergies indicates no known allergies.  Family History  Problem Relation Age of Onset  . Diabetes Mother   . Heart disease Mother   . Cancer Father     Social History Social History  Substance Use Topics  . Smoking status: Never Smoker  . Smokeless tobacco: Never Used  . Alcohol use No    Review of Systems Constitutional: No fever/chills Eyes: No visual changes. ENT: No sore throat. Cardiovascular: Denies chest pain. Respiratory: Denies shortness of breath. Gastrointestinal: No abdominal pain.  No nausea, no vomiting.  No diarrhea.  No constipation. Genitourinary: Negative for dysuria. Musculoskeletal: Negative for back pain. Skin: Negative for rash. Neurological: Negative for headaches, focal weakness or numbness.Cranial nerves II through XII are intact cerebellar finger-nose rapid alternating movements are intact sensation and strength intact throughout  10-point ROS otherwise negative.  ____________________________________________   PHYSICAL EXAM:  VITAL SIGNS: ED Triage Vitals  Enc Vitals Group     BP      Pulse      Resp      Temp      Temp src      SpO2      Weight      Height      Head Circumference      Peak Flow      Pain Score      Pain Loc      Pain Edu?      Excl. in [place]?    Constitutional: Alert and oriented. Well  appearing and in no acute distress. Eyes: Conjunctivae are normal. PERRL. EOMI. Head: Atraumatic. Nose: No congestion/rhinnorhea. Mouth/Throat: Mucous membranes are moist.  Oropharynx non-erythematous. Neck: No stridor. Cardiovascular: Normal rate, regular rhythm. Grossly normal heart sounds.  Good peripheral circulation. Respiratory: Normal respiratory effort.  No retractions. Lungs CTAB. Gastrointestinal: Soft and nontender. No distention. No abdominal bruits. No CVA tenderness. Musculoskeletal: No lower extremity tenderness nor edema.  No joint effusions. Neurologic:  Normal speech and language. No gross focal neurologic deficits are appreciated. No gait instability. Skin:  Skin is warm, dry and intact. No rash noted.  Psychiatric: Mood and affect are normal. Speech and behavior are normal.  ____________________________________________   LABS (all labs ordered are listed, but only abnormal results are displayed)  Labs Reviewed  URINALYSIS COMPLETEWITH MICROSCOPIC ([place]) - Abnormal; Notable for the following:       Result Value   Color, Urine YELLOW (*)    APPearance HAZY (*)    Hgb urine dipstick 1+ (*)    Leukocytes, UA 3+ (*)    Squamous Epithelial / LPF 6-30 (*)    All other components within normal limits  COMPREHENSIVE METABOLIC PANEL - Abnormal; Notable for the following:    Glucose, Bld 105 (*)    Calcium 8.7 (*)    All other components within normal limits  TROPONIN I  CBC WITH DIFFERENTIAL/PLATELET   ____________________________________________  EKG  EKG read and interpreted by me shows normal sinus rhythm rate of 91 normal axis no acute ST-T wave changes ____________________________________________  RADIOLOGY  PORTABLE CHEST 1 VIEW  COMPARISON:  12/22/2015  FINDINGS: Exam is lordotic. Normal cardiac silhouette. No effusion, infiltrate or pneumothorax. No acute osseous abnormality. Minimal scarring at the LEFT lung base similar  prior  IMPRESSION: Normal chest radiograph.  Lordotic view.   Electronically Signed   By: Suzy Bouchard M.D.   On: 08/22/2016 14:16 _______________________  ___CLINICAL DATA:  Fall, hit head.  EXAM: CT HEAD WITHOUT CONTRAST  TECHNIQUE: Contiguous axial images were obtained from the base of the skull through the vertex without intravenous contrast.  COMPARISON:  11/25/2014  FINDINGS: Brain: No acute intracranial abnormality. Specifically, no hemorrhage, hydrocephalus, mass lesion, acute infarction, or significant intracranial injury.  Vascular: No hyperdense vessel or unexpected calcification.  Skull: No acute calvarial abnormality.  Sinuses/Orbits: Diffuse mucosal thickening throughout the paranasal sinuses. Mastoid air cells are clear. Orbital soft tissues unremarkable.  Other: None  IMPRESSION: No acute intracranial abnormality.  Chronic sinusitis.   Electronically Signed   By: Rolm Baptise M.D.__________________   PROCEDURES  Procedure(s) performed:  Procedures  Critical Care performed:  ____________________________________________   INITIAL IMPRESSION / ASSESSMENT AND PLAN / ED COURSE  Pertinent labs & imaging results that were available during my care of the patient were reviewed by me and considered in my medical decision making (see chart for details).    Clinical Course     ____________________________________________   FINAL CLINICAL IMPRESSION(S) / ED DIAGNOSES  Final diagnoses:  Fall, initial encounter   Also sinusitis   NEW MEDICATIONS STARTED DURING THIS VISIT:  New Prescriptions   No medications on file     Note:  This document was prepared using Dragon voice recognition software and may include unintentional dictation errors.    Nena Polio, MD 08/22/16 1500

## 2016-08-22 NOTE — ED Triage Notes (Signed)
Patient brought in by [facility] from home for fall. Patient states that she lost her balance and fell, hitting her head on the dresser. Patient states that she has not had anything to eat today because nothing taste good because has a cold.

## 2016-08-22 NOTE — ED Notes (Signed)
Pt. Verbalizes understanding of d/c instructions. VS stable and pain controlled per pt.  Pt. In NAD at time of d/c and denies further concerns regarding this visit. Pt.ambulatory Out of the unit withg steady gait in good spirits. Pt advised to return to the ED at any time for emergent concerns, or for new/worsening symptoms.

## 2016-08-25 ENCOUNTER — Encounter: Payer: Self-pay | Admitting: Psychiatry

## 2016-08-25 ENCOUNTER — Ambulatory Visit (INDEPENDENT_AMBULATORY_CARE_PROVIDER_SITE_OTHER): Payer: Medicare Other | Admitting: Psychiatry

## 2016-08-25 VITALS — BP 123/78 | HR 86 | Temp 98.9°F | Ht 65.0 in | Wt 183.6 lb

## 2016-08-25 DIAGNOSIS — F3481 Disruptive mood dysregulation disorder: Secondary | ICD-10-CM | POA: Diagnosis not present

## 2016-08-25 MED ORDER — ESCITALOPRAM OXALATE 5 MG PO TABS
5.0000 mg | ORAL_TABLET | Freq: Every day | ORAL | 1 refills | Status: DC
Start: 2016-08-25 — End: 2016-12-08

## 2016-08-25 MED ORDER — QUETIAPINE FUMARATE 25 MG PO TABS: 50.0000 mg | ORAL_TABLET | Freq: Every day | ORAL | 1 refills | Status: DC

## 2016-08-25 MED ORDER — BUSPIRONE HCL 10 MG PO TABS: 10.0000 mg | ORAL_TABLET | Freq: Two times a day (BID) | ORAL | 1 refills | Status: DC

## 2016-08-25 NOTE — Progress Notes (Signed)
Psychiatric MD Progress NOTE  Patient Identification: Kaylee Wolf MRN:  [id number] Date of Evaluation:  08/25/2016 Referral Source: Primary care physician Chief Complaint:   Chief Complaint    Follow-up; Medication Refill     Visit Diagnosis:  No diagnosis found. Diagnosis:   Patient Active Problem List   Diagnosis Date Noted  . Blackout spell [R55] 07/20/2016  . 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 Recurrent major depressive disorder ([place]) [F33.9] 10/24/2015  . Varicose veins [I86.8] 10/24/2015  . Seizures ([place]) [R56.9] 10/24/2015  . Tinea pedis of both feet [B35.3] 10/24/2015  . Insomnia [G47.00] 10/24/2015  . RLS (restless legs syndrome) [G25.81] 10/24/2015  . Aggrieved [F43.21] 01/30/2013  . Clinical depression [F32.9] 11/16/2012  . Avitaminosis D [E55.9] 11/13/2012  . HLD (hyperlipidemia) [E78.5] 08/14/2012  . Seizure ([place]) [R56.9] 04/20/2012  . Diabetes mellitus ([place]) [E11.9] 04/19/2012   History of Present Illness:    Patient is a 65 year old widowed female who presented for follow-up accompanied by her daughter. She was referred by her primary care physician. Her daughter reported that patient passed out couple of days ago while standing in front of her grandson. Her grandson was present over there. He called the EMS and patient was brought to the emergency room. She did not lose consciousness. It was ruled out that she might have hypoglycemia. Patient was treated for the same. Patient has not been eating well for the past few days. Patient currently denied having any mood symptoms. She reported that she does not feel well due to having upper respiratory symptoms and is not eating well. We discussed about increasing her appetite and she agreed with the plan. Her daughter remains supportive. She is helping patient to lose weight at this time.  Patient  discussed about her medications. She has been compliant with them. Her mood symptoms are improving. She sleeps well at night. She denied having any suicidal homicidal ideations or plans.     Elements:  Severity:  moderate. Associated Signs/Symptoms: Depression Symptoms:  depressed mood, anhedonia, psychomotor agitation, fatigue, difficulty concentrating, anxiety, decreased appetite, (Hypo) Manic Symptoms:  Distractibility, Flight of Ideas, Impulsivity, Irritable Mood, Anxiety Symptoms:  Excessive Worry, Psychotic Symptoms:  none PTSD Symptoms: Negative NA  Past Medical History:  Past Medical History:  Diagnosis Date  . Anxiety   . Insomnia   . Mixed hyperlipidemia   . Recurrent major depressive disorder ([place])   . RLS (restless legs syndrome)   . Seizures ([place])   . Tinea pedis of both feet   . Type 2 diabetes mellitus ([place])   . Varicose veins     Past Surgical History:  Procedure Laterality Date  . none     Family History:  Family History  Problem Relation Age of Onset  . Diabetes Mother   . Heart disease Mother   . Cancer Father    Social History:   Social History   Social History  . Marital status: Widowed    Spouse name: N/A  . Number of children: N/A  . Years of education: N/A   Social History Main Topics  . Smoking status: Never Smoker  . Smokeless tobacco: Never Used  . Alcohol use No  . Drug use: No  . Sexual activity: Not Currently   Other Topics Concern  . None   Social History Narrative  . None   Additional Social History:  Patient is currently  widowed and lives by herself  Musculoskeletal: Strength & Muscle Tone: within normal limits Gait & Station: normal Patient leans: N/A  Psychiatric Specialty Exam: HPI  ROS  Blood pressure 123/78, pulse 86, temperature 98.9 F (37.2 C), temperature source Oral, height '5\' 5"'  (1.651 m), weight 183 lb 9.6 oz (83.3 kg).Body mass index is 30.55 kg/m.  General Appearance: Casual and Fairly  Groomed  Eye Contact:  Fair  Speech:  Clear and Coherent and Normal Rate  Volume:  Normal  Mood:  Anxious and Depressed  Affect:  Congruent  Thought Process:  Coherent  Orientation:  Full (Time, Place, and Person)  Thought Content:  WDL  Suicidal Thoughts:  No  Homicidal Thoughts:  No  Memory:  Immediate;   Fair  Judgement:  Intact  Insight:  Fair  Psychomotor Activity:  Decreased  Concentration:  Fair  Recall:  [employer] of Knowledge:Fair  Language: Fair  Akathisia:  No  Handed:  Right  AIMS (if indicated):    Assets:  Communication Skills Desire for Improvement Social Support Transportation  ADL's:  Intact  Cognition: WNL  Sleep:     Is the patient at risk to self?  No. Has the patient been a risk to self in the past 6 months?  No. Has the patient been a risk to self within the distant past?  No. Is the patient a risk to others?  No. Has the patient been a risk to others in the past 6 months?  No. Has the patient been a risk to others within the distant past?  No.  Allergies:  No Known Allergies Current Medications: Current Outpatient Prescriptions  Medication Sig Dispense Refill  . Alpha-Lipoic Acid 600 MG CAPS Take 1 capsule (600 mg total) by mouth daily. 30 each 11  . aspirin 81 MG tablet Take 81 mg by mouth daily.    [name] atorvastatin (LIPITOR) 20 MG tablet Take 1 tablet (20 mg total) by mouth daily. 90 tablet 3  . blood glucose meter kit and supplies KIT Dispense based on patient and insurance preference. Check blood glucose once daily. 1 each 0  . blood glucose meter kit and supplies Dispense based on patient and insurance preference. Use up to four times daily as directed. (FOR ICD-9 250.00, 250.01). 1 each 0  . busPIRone (BUSPAR) 10 MG tablet Take 1 tablet (10 mg total) by mouth 2 (two) times daily. 60 tablet 1  . Cholecalciferol (VITAMIN D-3) 1000 UNITS CAPS Take 1 capsule by mouth daily.     . clotrimazole (LOTRIMIN) 1 % cream Apply 1 application topically 2  (two) times daily. Reported on 06/07/2016    . divalproex (DEPAKOTE) 125 MG DR tablet     . escitalopram (LEXAPRO) 5 MG tablet Take 1 tablet (5 mg total) by mouth daily. 30 tablet 1  . hydrOXYzine (VISTARIL) 25 MG capsule Take 1 capsule (25 mg total) by mouth 3 (three) times daily as needed. 21 capsule 0  . lamoTRIgine (LAMICTAL) 150 MG tablet Take by mouth.    [name] lisinopril (PRINIVIL,ZESTRIL) 2.5 MG tablet Take 1 tablet (2.5 mg total) by mouth daily. 90 tablet 3  . meloxicam (MOBIC) 15 MG tablet Take 15 mg by mouth as needed. Reported on 06/07/2016    . metFORMIN (GLUCOPHAGE) 500 MG tablet TAKE ONE TABLET BY MOUTH TWICE DAILY 60 tablet 2  . ondansetron (ZOFRAN-ODT) 4 MG disintegrating tablet Take 1 tablet (4 mg total) by mouth 2 (two) times daily. 20 tablet 0  .  phenytoin (DILANTIN) 200 MG ER capsule Take 200 mg by mouth 2 (two) times daily.     . phenytoin (DILANTIN) 200 MG ER capsule Take by mouth.    . QUEtiapine (SEROQUEL) 25 MG tablet Take 2 tablets (50 mg total) by mouth at bedtime. 60 tablet 1  . rOPINIRole (REQUIP) 0.25 MG tablet TAKE TWO TABLETS BY MOUTH AT NIGHT FOR RESTLESS LEG     No current facility-administered medications for this visit.     Previous Psychotropic Medications:no  Substance Abuse History in the last 12 months:  No.  Consequences of Substance Abuse: Negative NA  Medical Decision Making:  Review of Psycho-Social Stressors (1)  Treatment Plan Summary: Medication management   Discussed with patient what the medications treatment risks benefits and alternatives.  Will change seroquel 50  mg daily at bedtime for the anxiety and mood symptoms Increase BuSpar 10  mg twice a day for impulsive behavior.  Add Lexapro 25m daily.   Also advised her to start taking vitamins including vitamin D and vitamin E  Follow-up in 2 months   More than 50% of the time spent in psychoeducation, counseling and coordination of care.    This note was generated in part or  whole with voice recognition software. Voice regonition is usually quite accurate but there are transcription errors that can and very often do occur. I apologize for any typographical errors that were not detected and corrected.   URainey Pines MD  8/30/20172:49 PM

## 2016-09-02 ENCOUNTER — Other Ambulatory Visit: Payer: Self-pay | Admitting: Family Medicine

## 2016-09-14 ENCOUNTER — Encounter: Payer: Self-pay | Admitting: Family Medicine

## 2016-09-14 ENCOUNTER — Ambulatory Visit (INDEPENDENT_AMBULATORY_CARE_PROVIDER_SITE_OTHER): Payer: Medicare Other | Admitting: Family Medicine

## 2016-09-14 ENCOUNTER — Ambulatory Visit: Payer: Self-pay | Admitting: Family Medicine

## 2016-09-14 VITALS — BP 113/72 | HR 87 | Temp 98.3°F | Resp 16 | Ht 65.0 in | Wt 181.0 lb

## 2016-09-14 DIAGNOSIS — E785 Hyperlipidemia, unspecified: Secondary | ICD-10-CM | POA: Diagnosis not present

## 2016-09-14 DIAGNOSIS — E1142 Type 2 diabetes mellitus with diabetic polyneuropathy: Secondary | ICD-10-CM

## 2016-09-14 DIAGNOSIS — R55 Syncope and collapse: Secondary | ICD-10-CM | POA: Diagnosis not present

## 2016-09-14 DIAGNOSIS — E559 Vitamin D deficiency, unspecified: Secondary | ICD-10-CM | POA: Diagnosis not present

## 2016-09-14 DIAGNOSIS — G40309 Generalized idiopathic epilepsy and epileptic syndromes, not intractable, without status epilepticus: Secondary | ICD-10-CM | POA: Diagnosis not present

## 2016-09-14 DIAGNOSIS — Z23 Encounter for immunization: Secondary | ICD-10-CM

## 2016-09-14 LAB — COMPLETE METABOLIC PANEL WITH GFR
ALT: 22 U/L (ref 6–29)
AST: 29 U/L (ref 10–35)
Albumin: 4 g/dL (ref 3.6–5.1)
Alkaline Phosphatase: 128 U/L (ref 33–130)
BUN: 10 mg/dL (ref 7–25)
CALCIUM: 8.7 mg/dL (ref 8.6–10.4)
CHLORIDE: 105 mmol/L (ref 98–110)
CO2: 30 mmol/L (ref 20–31)
CREATININE: 0.72 mg/dL (ref 0.50–0.99)
GFR, Est African American: >89 mL/min (ref >=60)
GFR, Est Non African American: 89 mL/min (ref >=60)
Glucose, Bld: 103 mg/dL — ABNORMAL HIGH (ref 65–99)
Potassium: 4.7 mmol/L (ref 3.5–5.3)
Sodium: 142 mmol/L (ref 135–146)
TOTAL PROTEIN: 6.1 g/dL (ref 6.1–8.1)
Total Bilirubin: 0.2 mg/dL (ref 0.2–1.2)

## 2016-09-14 LAB — POCT GLYCOSYLATED HEMOGLOBIN (HGB A1C): Hemoglobin A1C: 6.4

## 2016-09-14 MED ORDER — ZOSTER VACCINE LIVE 19400 UNT/0.65ML ~~LOC~~ SUSR: 0.6500 mL | Freq: Once | SUBCUTANEOUS | 0 refills | Status: AC

## 2016-09-14 NOTE — Assessment & Plan Note (Signed)
Controlled. Doing well on metformin only. Great response to diet and lifestyle changes. Neuropathy much improved. Continue current regimen. No changes.  Eye exam: Done 07/29/16 Unable to void for urine micro today- remains on ACE for renal protection.  Foot exam: Done.  Recheck 3 mos

## 2016-09-14 NOTE — Progress Notes (Signed)
 Subjective:    Patient ID: Kaylee Wolf, female    DOB: 06/10/1951, 65 y.o.   MRN: [id number]  HPI: Kaylee Wolf is a 65 y.o. female presenting on 09/14/2016 for Diabetes (obtw pt fall backward didn't know the cause got dizzy hospital didn't diagnosed )   HPI  Pt presents for follow-up of diabetes. A1c was 7.1% on last visit. She has lost 10lbs since last A1c check. Daughter has changed her diet. Taking metformin daily. A1c today is 6.4%. Neuropathy in feet is grossly improved. Occasional numbness but otherwise she can feel her feet.  Pt did have a recent fall on 8/27- called EMS- was seen in the ER. Did not think it was seizure. Thought it was food related. May not have eaten. She was also treated for sinusitis at the time. No further syncopal episodes.  Continues to follow with psych and neurology regarding seizure disorder and depression.    Past Medical History:  Diagnosis Date  . Anxiety   . Insomnia   . Mixed hyperlipidemia   . Recurrent major depressive disorder (HCC)   . RLS (restless legs syndrome)   . Seizures (HCC)   . Tinea pedis of both feet   . Type 2 diabetes mellitus (HCC)   . Varicose veins     Current Outpatient Prescriptions on File Prior to Visit  Medication Sig  . Alpha-Lipoic Acid 600 MG CAPS Take 1 capsule (600 mg total) by mouth daily.  . aspirin 81 MG tablet Take 81 mg by mouth daily.  . atorvastatin (LIPITOR) 20 MG tablet Take 1 tablet (20 mg total) by mouth daily.  . blood glucose meter kit and supplies KIT Dispense based on patient and insurance preference. Check blood glucose once daily.  . blood glucose meter kit and supplies Dispense based on patient and insurance preference. Use up to four times daily as directed. (FOR ICD-9 250.00, 250.01).  . busPIRone (BUSPAR) 10 MG tablet Take 1 tablet (10 mg total) by mouth 2 (two) times daily.  . Cholecalciferol (VITAMIN D-3) 1000 UNITS CAPS Take 1 capsule by mouth daily.   . clotrimazole (LOTRIMIN) 1  % cream Apply 1 application topically 2 (two) times daily. Reported on 06/07/2016  . divalproex (DEPAKOTE) 125 MG DR tablet   . escitalopram (LEXAPRO) 5 MG tablet Take 1 tablet (5 mg total) by mouth daily.  . hydrOXYzine (VISTARIL) 25 MG capsule Take 1 capsule (25 mg total) by mouth 3 (three) times daily as needed.  . lamoTRIgine (LAMICTAL) 150 MG tablet Take by mouth.  . lisinopril (PRINIVIL,ZESTRIL) 2.5 MG tablet Take 1 tablet (2.5 mg total) by mouth daily.  . meloxicam (MOBIC) 15 MG tablet Take 15 mg by mouth as needed. Reported on 06/07/2016  . metFORMIN (GLUCOPHAGE) 500 MG tablet TAKE ONE TABLET BY MOUTH TWICE DAILY  . ondansetron (ZOFRAN-ODT) 4 MG disintegrating tablet Take 1 tablet (4 mg total) by mouth 2 (two) times daily.  . phenytoin (DILANTIN) 200 MG ER capsule Take 200 mg by mouth 2 (two) times daily.   . phenytoin (DILANTIN) 200 MG ER capsule Take by mouth.  . QUEtiapine (SEROQUEL) 25 MG tablet Take 2 tablets (50 mg total) by mouth at bedtime.  . rOPINIRole (REQUIP) 0.25 MG tablet TAKE TWO TABLETS BY MOUTH AT NIGHT FOR RESTLESS LEG   No current facility-administered medications on file prior to visit.     Review of Systems  Constitutional: Negative for chills and fever.  HENT: Negative.   Respiratory: Negative for   cough, chest tightness and wheezing.   Cardiovascular: Negative for chest pain and leg swelling.  Gastrointestinal: Negative for abdominal pain, constipation, diarrhea, nausea and vomiting.  Endocrine: Negative.  Negative for cold intolerance, heat intolerance, polydipsia, polyphagia and polyuria.  Genitourinary: Negative for difficulty urinating and dysuria.  Musculoskeletal: Negative.   Neurological: Negative for dizziness, light-headedness and numbness.  Psychiatric/Behavioral: Negative.    Per HPI unless specifically indicated above     Objective:    BP 113/72   Pulse 87   Temp 98.3 F (36.8 C) (Oral)   Resp 16   Ht 5' 5" (1.651 m)   Wt 181 lb (82.1  kg)   BMI 30.12 kg/m   Wt Readings from Last 3 Encounters:  09/14/16 181 lb (82.1 kg)  08/25/16 183 lb 9.6 oz (83.3 kg)  08/22/16 185 lb 14.4 oz (84.3 kg)    Physical Exam  Constitutional: She is oriented to person, place, and time. She appears well-developed and well-nourished.  HENT:  Head: Normocephalic and atraumatic.  Neck: Neck supple.  Cardiovascular: Normal rate, regular rhythm and normal heart sounds.  Exam reveals no gallop and no friction rub.   No murmur heard. Pulmonary/Chest: Effort normal and breath sounds normal. She has no wheezes. She exhibits no tenderness.  Abdominal: Soft. Normal appearance and bowel sounds are normal. She exhibits no distension and no mass. There is no tenderness. There is no rebound and no guarding.  Musculoskeletal: Normal range of motion. She exhibits no edema or tenderness.  Lymphadenopathy:    She has no cervical adenopathy.  Neurological: She is alert and oriented to person, place, and time.  Skin: Skin is warm and dry.   Results for orders placed or performed in visit on 09/14/16  POCT HgB A1C  Result Value Ref Range   Hemoglobin A1C 6.4       Assessment & Plan:   Problem List Items Addressed This Visit      Cardiovascular and Mediastinum   Blackout spell    Seen in ER. No further issues at this time.         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 Doing well on metformin only. Great response to diet and lifestyle changes. Neuropathy much improved. Continue current regimen. No changes.  Eye exam: Done 07/29/16 Unable to void for urine micro today- remains on ACE for renal protection.  Foot exam: Done.  Recheck 3 mos      Relevant Orders   POCT HgB A1C (Completed)   COMPLETE METABOLIC PANEL WITH GFR     Nervous and Auditory   Idiopathic generalized epilepsy (HCC)    Followed by neurology. No recent seizures. Takes medication daily.         Other    HLD (hyperlipidemia)    Continued on Moderate intensity statin for diabetes.       Avitaminosis D    Recheck vitamin D levels today. Consider repletion if still low.       Relevant Orders   Vitamin D (25 hydroxy)    Other Visit Diagnoses    Needs flu shot       Relevant Orders   Flu Vaccine QUAD 36+ mos PF IM (Fluarix & Fluzone Quad PF) (Completed)   Need for zoster vaccination       Relevant Medications   Zoster Vaccine Live, PF, (ZOSTAVAX) 19400 UNT/0.65ML injection      Meds ordered this   encounter  Medications  . Zoster Vaccine Live, PF, (ZOSTAVAX) 19400 UNT/0.65ML injection    Sig: Inject 19,400 Units into the skin once.    Dispense:  1 each    Refill:  0    Order Specific Question:   Supervising Provider    Answer:   HAWKINS JR, JAMES H [[id number]]      Follow up plan: Return in about 3 months (around 12/14/2016), or if symptoms worsen or fail to improve, for Diabetes. . 

## 2016-09-14 NOTE — Assessment & Plan Note (Signed)
Followed by neurology. No recent seizures. Takes medication daily.

## 2016-09-14 NOTE — Assessment & Plan Note (Signed)
Recheck vitamin D levels today. Consider repletion if still low.

## 2016-09-14 NOTE — Assessment & Plan Note (Signed)
Continued on Moderate intensity statin for diabetes.

## 2016-09-14 NOTE — Patient Instructions (Addendum)
Great job on your diabetes!  Keep up the good work!   Please get your shingles shot when you can.

## 2016-09-14 NOTE — Assessment & Plan Note (Signed)
Seen in ER. No further issues at this time.

## 2016-09-15 ENCOUNTER — Telehealth: Payer: Self-pay | Admitting: Family Medicine

## 2016-09-15 LAB — VITAMIN D 25 HYDROXY (VIT D DEFICIENCY, FRACTURES): VIT D 25 HYDROXY: 30 ng/mL (ref 30–100)

## 2016-09-15 NOTE — Telephone Encounter (Signed)
Done

## 2016-09-15 NOTE — Telephone Encounter (Signed)
Kaylee Wolf called to check test results.  Her call back number is [phone]

## 2016-10-24 ENCOUNTER — Other Ambulatory Visit: Payer: Self-pay | Admitting: Psychiatry

## 2016-10-26 ENCOUNTER — Ambulatory Visit: Payer: Medicare Other | Admitting: Psychiatry

## 2016-10-28 ENCOUNTER — Ambulatory Visit: Payer: Self-pay | Admitting: Family Medicine

## 2016-11-01 ENCOUNTER — Ambulatory Visit: Payer: Medicare Other | Admitting: Psychiatry

## 2016-11-03 ENCOUNTER — Ambulatory Visit: Payer: Medicare Other | Admitting: Psychiatry

## 2016-11-14 ENCOUNTER — Other Ambulatory Visit: Payer: Self-pay | Admitting: Psychiatry

## 2016-11-15 ENCOUNTER — Telehealth: Payer: Self-pay

## 2016-11-15 DIAGNOSIS — F3481 Disruptive mood dysregulation disorder: Secondary | ICD-10-CM

## 2016-11-15 MED ORDER — BUSPIRONE HCL 10 MG PO TABS: 10.0000 mg | ORAL_TABLET | Freq: Two times a day (BID) | ORAL | 0 refills | Status: DC

## 2016-11-15 NOTE — Telephone Encounter (Signed)
New 2 week order for patient's Buspar refill e-scribed to patient's [facility] with instruction for patient to schedule an appointment for any further refills.

## 2016-11-15 NOTE — Telephone Encounter (Signed)
Medication refill request - Fax refill request received for patient's Buspar, last ordered 08/25/16 + 1 refill.  Pt. canceled appointment 10/31 and then no showed 10/28/16 and 11/01/16.  She was then canceled due to provider out 11/03/16 with no current appt

## 2016-11-15 NOTE — Telephone Encounter (Signed)
Med refilled x 2 weeks

## 2016-11-29 ENCOUNTER — Telehealth: Payer: Self-pay

## 2016-11-29 NOTE — Telephone Encounter (Signed)
left message for 20 tablets for the quetiapine fumarate 25mg  pt has canceled last couple of appt pt has appt on  12-08-16 so enough medication was called in to last until patient shows up for that appt.

## 2016-11-29 NOTE — Telephone Encounter (Signed)
received a fax requesting a refill on quetiapine fumarate 25mg  take two tablets by mouth at bedtime.

## 2016-12-08 ENCOUNTER — Ambulatory Visit (INDEPENDENT_AMBULATORY_CARE_PROVIDER_SITE_OTHER): Payer: Medicare Other | Admitting: Psychiatry

## 2016-12-08 ENCOUNTER — Encounter: Payer: Self-pay | Admitting: Psychiatry

## 2016-12-08 VITALS — BP 122/79 | HR 90 | Wt 180.8 lb

## 2016-12-08 DIAGNOSIS — F331 Major depressive disorder, recurrent, moderate: Secondary | ICD-10-CM | POA: Diagnosis not present

## 2016-12-08 DIAGNOSIS — F3481 Disruptive mood dysregulation disorder: Secondary | ICD-10-CM

## 2016-12-08 MED ORDER — QUETIAPINE FUMARATE 100 MG PO TABS: 100.0000 mg | ORAL_TABLET | Freq: Every day | ORAL | 1 refills | Status: DC

## 2016-12-08 MED ORDER — ESCITALOPRAM OXALATE 10 MG PO TABS: 10.0000 mg | ORAL_TABLET | Freq: Every day | ORAL | 1 refills | Status: DC

## 2016-12-08 MED ORDER — BUSPIRONE HCL 10 MG PO TABS: 10.0000 mg | ORAL_TABLET | Freq: Two times a day (BID) | ORAL | 0 refills | Status: DC

## 2016-12-08 NOTE — Progress Notes (Signed)
Psychiatric MD Progress NOTE  Patient Identification: Kaylee Wolf MRN:  [id number] Date of Evaluation:  12/08/2016 Referral Source: Primary care physician Chief Complaint:   Chief Complaint    Follow-up; Medication Refill     Visit Diagnosis:    ICD-9-CM ICD-10-CM   1. Disruptive mood dysregulation disorder ([place]) 296.99 F34.81   2. MDD (major depressive disorder), recurrent episode, moderate (HCC) 296.32 F33.1    Diagnosis:   Patient Active Problem List   Diagnosis Date Noted  . Blackout spell [R55] 07/20/2016  . Idiopathic generalized epilepsy ([place]) [E33.295] 01/06/2016  . 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 Recurrent major depressive disorder ([place]) [F33.9] 10/24/2015  . Varicose veins [I83.90] 10/24/2015  . Seizures ([place]) [R56.9] 10/24/2015  . Tinea pedis of both feet [B35.3] 10/24/2015  . Insomnia [G47.00] 10/24/2015  . RLS (restless legs syndrome) [G25.81] 10/24/2015  . Aggrieved [F43.20] 01/30/2013  . Clinical depression [F32.9] 11/16/2012  . Avitaminosis D [E55.9] 11/13/2012  . HLD (hyperlipidemia) [E78.5] 08/14/2012  . Seizure ([place]) [R56.9] 04/20/2012  . Diabetes mellitus ([place]) [E11.9] 04/19/2012   History of Present Illness:    Patient is a 65 year old widowed female who presented for follow-up accompanied by her daughter. Patient reported that she continues to have agitation anger and more swings during this time of year as she is unable to accept the death of her husband her mother-in-law and her mother. Her daughter also reported the same. She reported that patient is not responding well to her medications. Patient was last seen in August. Her daughter reported that she will start following up on a regular basis. Patient is not sleeping well at night. She is only sleeping for 2-3 hours. Patient reported that she is also following with a neurologist who has started her on lamotrigine and Depakote. He is  planning to taper her off of the Dilantin. Her daughter has not noticed any adverse reactions related to the combination of the lamotrigine. She reported that she is unable to accept that her husband has already been deceased. Patient reported that she works on her puzzles to try to stay calm.  We discussed about her medications. Patient is receptive to medication adjustment at this time. She currently denied having any suicidal ideations or plans.    Her daughter remains supportive.    Elements:  Severity:  moderate. Associated Signs/Symptoms: Depression Symptoms:  depressed mood, anhedonia, psychomotor agitation, fatigue, difficulty concentrating, anxiety, decreased appetite, (Hypo) Manic Symptoms:  Distractibility, Flight of Ideas, Impulsivity, Irritable Mood, Anxiety Symptoms:  Excessive Worry, Psychotic Symptoms:  none PTSD Symptoms: Negative NA  Past Medical History:  Past Medical History:  Diagnosis Date  . Anxiety   . Insomnia   . Mixed hyperlipidemia   . Recurrent major depressive disorder ([place])   . RLS (restless legs syndrome)   . Seizures ([place])   . Tinea pedis of both feet   . Type 2 diabetes mellitus ([place])   . Varicose veins     Past Surgical History:  Procedure Laterality Date  . none     Family History:  Family History  Problem Relation Age of Onset  . Diabetes Mother   . Heart disease Mother   . Cancer Father    Social History:   Social History   Social History  . Marital status: Widowed    Spouse name: N/A  . Number of children: N/A  . Years of education: N/A   Social History Main Topics  .  Smoking status: Never Smoker  . Smokeless tobacco: Never Used  . Alcohol use No  . Drug use: No  . Sexual activity: Not Currently   Other Topics Concern  . None   Social History Narrative  . None   Additional Social History:  Patient is currently widowed and lives by herself  Musculoskeletal: Strength & Muscle Tone: within normal  limits Gait & Station: normal Patient leans: N/A  Psychiatric Specialty Exam: Medication Refill     ROS  Blood pressure 122/79, pulse 90, weight 180 lb 12.8 oz (82 kg).Body mass index is 30.09 kg/m.  General Appearance: Casual and Fairly Groomed  Eye Contact:  Fair  Speech:  Clear and Coherent and Normal Rate  Volume:  Normal  Mood:  Anxious and Depressed  Affect:  Congruent  Thought Process:  Coherent  Orientation:  Full (Time, Place, and Person)  Thought Content:  WDL  Suicidal Thoughts:  No  Homicidal Thoughts:  No  Memory:  Immediate;   Fair  Judgement:  Intact  Insight:  Fair  Psychomotor Activity:  Decreased  Concentration:  Fair  Recall:  [employer] of Knowledge:Fair  Language: Fair  Akathisia:  No  Handed:  Right  AIMS (if indicated):    Assets:  Communication Skills Desire for Improvement Social Support Transportation  ADL's:  Intact  Cognition: WNL  Sleep:     Is the patient at risk to self?  No. Has the patient been a risk to self in the past 6 months?  No. Has the patient been a risk to self within the distant past?  No. Is the patient a risk to others?  No. Has the patient been a risk to others in the past 6 months?  No. Has the patient been a risk to others within the distant past?  No.  Allergies:  No Known Allergies Current Medications: Current Outpatient Prescriptions  Medication Sig Dispense Refill  . Alpha-Lipoic Acid 600 MG CAPS Take 1 capsule (600 mg total) by mouth daily. 30 each 11  . aspirin 81 MG tablet Take 81 mg by mouth daily.    [name] atorvastatin (LIPITOR) 20 MG tablet Take 1 tablet (20 mg total) by mouth daily. 90 tablet 3  . blood glucose meter kit and supplies KIT Dispense based on patient and insurance preference. Check blood glucose once daily. 1 each 0  . blood glucose meter kit and supplies Dispense based on patient and insurance preference. Use up to four times daily as directed. (FOR ICD-9 250.00, 250.01). 1 each 0  .  busPIRone (BUSPAR) 10 MG tablet Take 1 tablet (10 mg total) by mouth 2 (two) times daily. Evaluation required for further refills. Call for an appointment. 28 tablet 0  . Cholecalciferol (VITAMIN D-3) 1000 UNITS CAPS Take 1 capsule by mouth daily.     . clotrimazole (LOTRIMIN) 1 % cream Apply 1 application topically 2 (two) times daily. Reported on 06/07/2016    . divalproex (DEPAKOTE) 125 MG DR tablet     . escitalopram (LEXAPRO) 5 MG tablet Take 1 tablet (5 mg total) by mouth daily. 30 tablet 1  . hydrOXYzine (VISTARIL) 25 MG capsule Take 1 capsule (25 mg total) by mouth 3 (three) times daily as needed. 21 capsule 0  . lamoTRIgine (LAMICTAL) 150 MG tablet Take by mouth.    [name] lisinopril (PRINIVIL,ZESTRIL) 2.5 MG tablet Take 1 tablet (2.5 mg total) by mouth daily. 90 tablet 3  . meloxicam (MOBIC) 15 MG tablet Take 15 mg  by mouth as needed. Reported on 06/07/2016    . metFORMIN (GLUCOPHAGE) 500 MG tablet TAKE ONE TABLET BY MOUTH TWICE DAILY 60 tablet 11  . ondansetron (ZOFRAN-ODT) 4 MG disintegrating tablet Take 1 tablet (4 mg total) by mouth 2 (two) times daily. 20 tablet 0  . phenytoin (DILANTIN) 200 MG ER capsule Take 200 mg by mouth 2 (two) times daily.     . phenytoin (DILANTIN) 200 MG ER capsule Take by mouth.    . QUEtiapine (SEROQUEL) 25 MG tablet Take 2 tablets (50 mg total) by mouth at bedtime. 60 tablet 1  . rOPINIRole (REQUIP) 0.25 MG tablet TAKE TWO TABLETS BY MOUTH AT NIGHT FOR RESTLESS LEG     No current facility-administered medications for this visit.     Previous Psychotropic Medications:no  Substance Abuse History in the last 12 months:  No.  Consequences of Substance Abuse: Negative NA  Medical Decision Making:  Review of Psycho-Social Stressors (1)  Treatment Plan Summary: Medication management   Discussed with patient what the medications treatment risks benefits and alternatives.  Will change seroquel 100 mg daily at bedtime for the anxiety and mood  symptoms Increase BuSpar 10  mg twice a day for impulsive behavior.  Add Lexapro 10 mg daily.   Also advised her to start taking vitamins including vitamin D and vitamin E  Follow-up in 1  months   More than 50% of the time spent in psychoeducation, counseling and coordination of care.    This note was generated in part or whole with voice recognition software. Voice regonition is usually quite accurate but there are transcription errors that can and very often do occur. I apologize for any typographical errors that were not detected and corrected.   Rainey Pines, MD  12/13/20179:47 AM

## 2016-12-15 DIAGNOSIS — G40309 Generalized idiopathic epilepsy and epileptic syndromes, not intractable, without status epilepticus: Secondary | ICD-10-CM | POA: Diagnosis not present

## 2016-12-15 DIAGNOSIS — R202 Paresthesia of skin: Secondary | ICD-10-CM | POA: Diagnosis not present

## 2016-12-15 DIAGNOSIS — R55 Syncope and collapse: Secondary | ICD-10-CM | POA: Diagnosis not present

## 2016-12-15 DIAGNOSIS — R252 Cramp and spasm: Secondary | ICD-10-CM | POA: Diagnosis not present

## 2016-12-17 ENCOUNTER — Ambulatory Visit: Payer: Self-pay | Admitting: Family Medicine

## 2016-12-29 ENCOUNTER — Other Ambulatory Visit: Payer: Self-pay | Admitting: Psychiatry

## 2016-12-30 NOTE — Telephone Encounter (Signed)
done

## 2017-01-07 ENCOUNTER — Ambulatory Visit: Payer: Medicare Other | Admitting: Psychiatry

## 2017-01-24 ENCOUNTER — Other Ambulatory Visit: Payer: Self-pay | Admitting: Psychiatry

## 2017-02-07 ENCOUNTER — Encounter: Payer: Self-pay | Admitting: Family Medicine

## 2017-02-07 ENCOUNTER — Ambulatory Visit (INDEPENDENT_AMBULATORY_CARE_PROVIDER_SITE_OTHER): Payer: Medicare Other | Admitting: Family Medicine

## 2017-02-07 VITALS — BP 118/61 | HR 78 | Temp 98.6°F | Resp 16 | Ht 65.0 in | Wt 184.0 lb

## 2017-02-07 DIAGNOSIS — B351 Tinea unguium: Secondary | ICD-10-CM | POA: Diagnosis not present

## 2017-02-07 DIAGNOSIS — H9201 Otalgia, right ear: Secondary | ICD-10-CM

## 2017-02-07 DIAGNOSIS — M26621 Arthralgia of right temporomandibular joint: Secondary | ICD-10-CM | POA: Insufficient documentation

## 2017-02-07 DIAGNOSIS — J302 Other seasonal allergic rhinitis: Secondary | ICD-10-CM | POA: Diagnosis not present

## 2017-02-07 DIAGNOSIS — J309 Allergic rhinitis, unspecified: Secondary | ICD-10-CM | POA: Insufficient documentation

## 2017-02-07 MED ORDER — TERBINAFINE HCL 250 MG PO TABS: 250.0000 mg | ORAL_TABLET | Freq: Every day | ORAL | 1 refills | Status: DC

## 2017-02-07 MED ORDER — FLUTICASONE PROPIONATE 50 MCG/ACT NA SUSP: 2.0000 | Freq: Every day | NASAL | 3 refills | Status: DC

## 2017-02-07 NOTE — Patient Instructions (Signed)
Thank you for coming in to clinic today.  1. For your Left toenail, you have a thickening Toenail Fungus - We will treat with Terbinafine, or Lamsil oral anti fungal pill, take one 250mg  daily for 6 weeks, then if not resolved get refill for 6 more weeks to finish entire 12 week course then this is resolved - If you get worsening redness, swelling, drainage of pus, fevers, spreading redness, then return to clinic for evaluation or can go to Emergency Department if more concerned  2. 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 In the future you can repeat the same everyday Tylenol course for 1-2 weeks at a time.  - This is safe to take with anti-inflammatory medicines (Ibuprofen, Advil, Naproxen, Aleve, Meloxicam, Mobic)  Dentist will likely check X-ray of jaw to diagnose this and discuss further treatment.  -----------  Start Loratadine (Claritin) 10mg  daily and Flonase 2 sprays in each nostril daily for next 4-6 weeks, then may stop and use seasonally or as needed  Please schedule a follow-up appointment with Dr. Parks Ranger in 6 weeks as needed for Left toenail fungal infection and R TMJ  If you have any other questions or concerns, please feel free to call the clinic or send a message through [facility]. You may also schedule an earlier appointment if necessary.  Kaylee Putnam, DO [facility]

## 2017-02-07 NOTE — Assessment & Plan Note (Signed)
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 Other etiology with R ear and TM appear mostly normal, some possible R eustachian tube dysfunction, but no AOM or other acute etiology. Considered referred dental pain, has poor dentition but no acute abscess or obvious dental problem  Plan: 1. Recommend schedule apt with Dentist asap, they can check TMJ x-rays for further evaluation and diagnosis, management 2. Can take NSAID, Tylenol PRN pain, conservative therapy as discussed, avoid opening mouth very wide and limit hard crunchy foods 3. Follow-up as needed

## 2017-02-07 NOTE — Progress Notes (Signed)
Subjective:    Patient ID: Kaylee Wolf, female    DOB: 11/13/1951, 66 y.o.   MRN: [id number]  Kaylee Wolf is a 66 y.o. female presenting on 02/07/2017 for Otalgia (Right side onset week-- pain rediate from shoulder to neck to ear)  Patient presents for a same day appointment.  HPI   RIGHT JAW / EAR PAIN: - Reports symptoms started 2 days ago, worsening overall, with some frequent pain during day without known cause or etiology that provokes pain. Also worse at night with some throbbing. 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 Has regular dentist. Describes current symptoms "feel like a tootheache" but does not seem to have oral pain or any dental problem - Denies any loss of hearing, tinnitus, ear discharge  Left Onychomycosis - Additional concern today with L great toe thickening toenail and some slight lifting up of toenail, history of Diabetes, and concern about feet. Does not have any ulceration or other problem. No other toes with similar thickening. - Did not try topical OTC anti fungal therapy - Denies any significant redness, swelling, pain, drainage of pus, fevers/chills   Social History  Substance Use Topics  . Smoking status: Never Smoker  . Smokeless tobacco: Never Used  . Alcohol use No    Review of Systems Per HPI unless specifically indicated above     Objective:    BP 118/61   Pulse 78   Temp 98.6 F (37 C) (Oral)   Resp 16   Ht 5\' 5"  (1.651 m)   Wt 184 lb (83.5 kg)   BMI 30.62 kg/m   Wt Readings from Last 3 Encounters:  02/07/17 184 lb (83.5 kg)  09/14/16 181 lb (82.1 kg)  08/22/16 185 lb 14.4 oz (84.3 kg)      Physical Exam  Constitutional: She appears well-developed and well-nourished. No distress.  Well-appearing, comfortable, cooperative  HENT:  Head: Normocephalic and atraumatic.  Mouth/Throat: Oropharynx is clear and moist.  Frontal / maxillary sinuses non-tender. Nares mostly patent without purulence or edema.  Bilateral earl canals without wax or cerumen impaction. R TM has some slight serous effusion without purulence, no erythema or bulging. Left TM clear without abnormality.  Oropharynx clear without erythema, exudates, edema or asymmetry.  Palpation of bilateral TMJ and jaw opening, with dramatic loud audible clicking and palpable pop R>L TMJ also with abnormal shifted movement with jaw opening R > L.  Localized mild tenderness to palpation inferior to R ear at angle of mandible and still somewhat inferior. Mastoid and tragus non tender  Eyes: Conjunctivae are normal. Right eye exhibits no discharge. Left eye exhibits no discharge.  Neck: Normal range of motion. Neck supple.  Cardiovascular: Normal rate.   Pulmonary/Chest: Effort normal.  Lymphadenopathy:    She has no cervical adenopathy.  Neurological: She is alert.  Skin: Skin is warm and dry. No rash noted. She is not diaphoretic. No erythema.  L great toenail with thickened brittle toenail with small area of lateral aspect distally slightly raised, no surrounding erythema or edema. No sign of bacterial infection or DM ulceration.  Nursing note and vitals reviewed.      Assessment & Plan:  Problem List Items Addressed This Visit    Onychomycosis of left great toe    Consistent with onychomycosis L great toe in diabetic patient without other complication  Plan: 1. Start oral Terbinafine 250mg  daily for 6 weeks, #45 pills +1 refill, can refill and continue for 6 more weeks most likely for total 12 week therapy. Reviewed prior labs without abnormal LFTs 2. Follow-up if not improved 6 weeks can re-evaluate before  continuing therapy, reviewed return criteria if secondary bacterial infection, future consider return to Podiatry she was discharged from podiatry based on not needing to follow-up with them anymore      Relevant Medications   terbinafine (LAMISIL) 250 MG tablet   Arthralgia of right temporomandibular joint - Primary    Suspected most likely R TMJ (also with other OA in body) as acute etiology of her pain, with very significant exam with palpable click and pain over R TMJ. Other etiology with R ear and TM appear mostly normal, some possible R eustachian tube dysfunction, but no AOM or other acute etiology. Considered referred dental pain, has poor dentition but no acute abscess or obvious dental problem  Plan: 1. Recommend schedule apt with Dentist asap, they can check TMJ x-rays for further evaluation and diagnosis, management 2. Can take NSAID, Tylenol PRN pain, conservative therapy as discussed, avoid opening mouth very wide and limit hard crunchy foods 3. Follow-up as needed      Allergic rhinitis due to allergen    Possible R eustachian tube dysfunction, in setting chronic allergies with rhinitis Start OTC Loratadine 10mg  daily and Flonase 2 sprays each nare daily for up to 4-6 weeks maybe longer      Relevant Medications   fluticasone (FLONASE) 50 MCG/ACT nasal spray    Other Visit Diagnoses    Right ear pain       Less likely ear pain based on history, pain is localized inferior to ear and TM normal. No AOM or impaction. Treat allergic rhinitis possible eustachian tube      Meds ordered this encounter  Medications  . gabapentin (NEURONTIN) 100 MG capsule    Sig: Take by mouth 2 (two) times daily.   [name] terbinafine (LAMISIL) 250 MG tablet    Sig: Take 1 tablet (250 mg total) by mouth daily. For 6 weeks, can refill for 6 additional weeks if not resolved    Dispense:  45 tablet    Refill:  1  . fluticasone (FLONASE) 50 MCG/ACT nasal spray    Sig: Place 2 sprays into both  nostrils daily. Use for 4-6 weeks then stop and use seasonally or as needed.    Dispense:  16 g    Refill:  3      Follow up plan: Return in about 6 weeks (around 03/21/2017) for Left Toenail Fungal infection / R TMJ.  Nobie Putnam, [facility] 02/07/2017, 5:32 PM

## 2017-02-07 NOTE — Assessment & Plan Note (Signed)
Consistent with onychomycosis L great toe in diabetic patient without other complication  Plan: 1. Start oral Terbinafine 250mg  daily for 6 weeks, #45 pills +1 refill, can refill and continue for 6 more weeks most likely for total 12 week therapy. Reviewed prior labs without abnormal LFTs 2. Follow-up if not improved 6 weeks can re-evaluate before continuing therapy, reviewed return criteria if secondary bacterial infection, future consider return to Podiatry she was discharged from podiatry based on not needing to follow-up with them anymore

## 2017-02-07 NOTE — Assessment & Plan Note (Signed)
Possible R eustachian tube dysfunction, in setting chronic allergies with rhinitis Start OTC Loratadine 10mg  daily and Flonase 2 sprays each nare daily for up to 4-6 weeks maybe longer

## 2017-02-19 ENCOUNTER — Emergency Department
Admission: EM | Admit: 2017-02-19 | Discharge: 2017-02-19 | Disposition: A | Discharge status: Home / Self Care | Payer: Medicare Other | Attending: Emergency Medicine | Admitting: Emergency Medicine

## 2017-02-19 ENCOUNTER — Emergency Department: Payer: Medicare Other

## 2017-02-19 ENCOUNTER — Encounter: Payer: Self-pay | Admitting: Emergency Medicine

## 2017-02-19 DIAGNOSIS — Z79899 Other long term (current) drug therapy: Secondary | ICD-10-CM | POA: Diagnosis not present

## 2017-02-19 DIAGNOSIS — R221 Localized swelling, mass and lump, neck: Secondary | ICD-10-CM | POA: Insufficient documentation

## 2017-02-19 DIAGNOSIS — R791 Abnormal coagulation profile: Secondary | ICD-10-CM | POA: Insufficient documentation

## 2017-02-19 DIAGNOSIS — E119 Type 2 diabetes mellitus without complications: Secondary | ICD-10-CM | POA: Diagnosis not present

## 2017-02-19 DIAGNOSIS — H538 Other visual disturbances: Secondary | ICD-10-CM | POA: Diagnosis not present

## 2017-02-19 DIAGNOSIS — M542 Cervicalgia: Secondary | ICD-10-CM | POA: Diagnosis not present

## 2017-02-19 DIAGNOSIS — IMO0002 Reserved for concepts with insufficient information to code with codable children: Secondary | ICD-10-CM

## 2017-02-19 DIAGNOSIS — R229 Localized swelling, mass and lump, unspecified: Secondary | ICD-10-CM

## 2017-02-19 DIAGNOSIS — Z7984 Long term (current) use of oral hypoglycemic drugs: Secondary | ICD-10-CM | POA: Insufficient documentation

## 2017-02-19 LAB — CBC WITH DIFFERENTIAL/PLATELET
BASOS PCT: 0 %
Basophils Absolute: 0 10*3/uL (ref 0–0.1)
Eosinophils Absolute: 0.1 10*3/uL (ref 0–0.7)
Eosinophils Relative: 3 %
HEMATOCRIT: 40.3 % (ref 35.0–47.0)
HEMOGLOBIN: 13.9 g/dL (ref 12.0–16.0)
LYMPHS ABS: 1.4 10*3/uL (ref 1.0–3.6)
LYMPHS PCT: 34 %
MCH: 30.8 pg (ref 26.0–34.0)
MCHC: 34.5 g/dL (ref 32.0–36.0)
MCV: 89.5 fL (ref 80.0–100.0)
MONOS PCT: 12 %
Monocytes Absolute: 0.5 10*3/uL (ref 0.2–0.9)
NEUTROS ABS: 2.2 10*3/uL (ref 1.4–6.5)
NEUTROS PCT: 51 %
Platelets: 195 10*3/uL (ref 150–440)
RBC: 4.5 MIL/uL (ref 3.80–5.20)
RDW: 13.9 % (ref 11.5–14.5)
WBC: 4.2 10*3/uL (ref 3.6–11.0)

## 2017-02-19 LAB — COMPREHENSIVE METABOLIC PANEL
ALBUMIN: 4.1 g/dL (ref 3.5–5.0)
ALT: 13 U/L — AB (ref 14–54)
AST: 20 U/L (ref 15–41)
Alkaline Phosphatase: 108 U/L (ref 38–126)
Anion gap: 5 (ref 5–15)
BUN: 16 mg/dL (ref 6–20)
CHLORIDE: 103 mmol/L (ref 101–111)
CO2: 31 mmol/L (ref 22–32)
Calcium: 9 mg/dL (ref 8.9–10.3)
Creatinine, Ser: 0.82 mg/dL (ref 0.44–1.00)
GFR calc Af Amer: >60 mL/min (ref >60)
GFR calc non Af Amer: >60 mL/min (ref >60)
GLUCOSE: 123 mg/dL — AB (ref 65–99)
POTASSIUM: 4.1 mmol/L (ref 3.5–5.1)
SODIUM: 139 mmol/L (ref 135–145)
Total Bilirubin: 0.4 mg/dL (ref 0.3–1.2)
Total Protein: 6.9 g/dL (ref 6.5–8.1)

## 2017-02-19 LAB — PROTIME-INR
INR: 0.94
PROTHROMBIN TIME: 12.6 s (ref 11.4–15.2)

## 2017-02-19 MED ORDER — FLUORESCEIN SODIUM 0.6 MG OP STRP
2.0000 | ORAL_STRIP | Freq: Once | OPHTHALMIC | Status: AC
  Administered 2017-02-19: 2 via OPHTHALMIC
  Filled 2017-02-19: qty 2

## 2017-02-19 MED ORDER — TETRACAINE HCL 0.5 % OP SOLN
1.0000 [drp] | Freq: Once | OPHTHALMIC | Status: AC
  Administered 2017-02-19: 2 [drp] via OPHTHALMIC
  Filled 2017-02-19: qty 2

## 2017-02-19 MED ORDER — IOPAMIDOL (ISOVUE-300) INJECTION 61%
75.0000 mL | Freq: Once | INTRAVENOUS | Status: AC | PRN
  Administered 2017-02-19: 75 mL via INTRAVENOUS

## 2017-02-19 NOTE — Discharge Instructions (Signed)
Please call to make an appointment to see the ophthalmologist on Monday for recheck. Return to the emergency department sooner for any new or worsening symptoms such as worsening vision, flashing lights, floating lights, or for any other concerns. Also keep your follow-up with the otolaryngologist as scheduled.  Ct Soft Tissue Neck W Contrast  Result Date: 02/19/2017 CLINICAL DATA:  Right lateral neck mass, painful to touch EXAM: CT NECK WITH CONTRAST TECHNIQUE: Multidetector CT imaging of the neck was performed using the standard protocol following the bolus administration of intravenous contrast. CONTRAST:  71mL ISOVUE-300 IOPAMIDOL (ISOVUE-300) INJECTION 61% COMPARISON:  None. FINDINGS: Pharynx and larynx: Symmetric enlargement of the tonsils bilaterally without mass lesion, most likely hypertrophy. Nasopharynx normal. Larynx normal. Salivary glands: No inflammation, mass, or stone. Thyroid: Normal. Lymph nodes: No pathologic lymph nodes. Right level 2 lymph nodes 9 mm and 11 mm. Left level 2 lymph node 7 mm. Vascular: Mild carotid artery calcification bilaterally. Carotid artery and jugular vein patent bilaterally. Limited intracranial: Negative Visualized orbits: Negative Mastoids and visualized paranasal sinuses: Negative Skeleton: Advanced disc degeneration and spondylosis with spinal stenosis at C5-6 and C6-7. No acute skeletal abnormality. Upper chest: Negative Other: BB marks an area of palpable mass in the right posterior neck behind the right ear lobe region. Underlying muscles are normal. No mass or adenopathy. Skin is normal. No abscess. Adjacent mastoid sinuses clear. IMPRESSION: No abnormality in the right neck behind the ear in the area of palpable concern. Negative for mass or adenopathy in neck. Bilateral tonsillar hypertrophy. Electronically Signed   By: Franchot Gallo M.D.   On: 02/19/2017 13:24

## 2017-02-19 NOTE — ED Notes (Signed)
Pt taken to CT at this time.

## 2017-02-19 NOTE — ED Notes (Signed)
Pt given Diet coke per request.

## 2017-02-19 NOTE — ED Provider Notes (Signed)
[facility] Emergency Department Provider Note  ____________________________________________   First MD Initiated Contact with Patient 02/19/17 1120     (approximate)  I have reviewed the triage vital signs and the nursing notes.   HISTORY  Chief Complaint Blurred Vision    HPI Kaylee Wolf is a 66 y.o. female who comes to the ED with 2 issues.  Primarily she comes to the ED with blurred vision that began this morning.  "like I'm looking in a snowstorm."  She also reports about one month of severe aching right neck pain. She denies eye pain. She denies flashes or floaters. Her visual acuity is now back to her baseline. She also reports about a month of sharp severe aching discomfort in her right neck. She feels like she feels a mass in her neck. Pain is worse when she moves improved with rest. She saw her primary care physician who felt it was TMJ and referred her to a dentist. She saw the dentist who said it is not TMJ and has referred her to an otolaryngologist on March 1. She comes to the emergency department today for her eyes and not for her neck.     Past Medical History:  Diagnosis Date  . Anxiety   . Insomnia   . Mixed hyperlipidemia   . Recurrent major depressive disorder ([place])   . RLS (restless legs syndrome)   . Seizures ([place])   . Tinea pedis of both feet   . Type 2 diabetes mellitus ([place])   . Varicose veins     Patient Active Problem List   Diagnosis Date Noted  . Onychomycosis of left great toe 02/07/2017  . Allergic rhinitis due to allergen 02/07/2017  . Arthralgia of right temporomandibular joint 02/07/2017  . Blackout spell 07/20/2016  . 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 Recurrent major depressive disorder ([place]) 10/24/2015  . Varicose veins 10/24/2015  . Seizures ([place]) 10/24/2015  . Tinea pedis of both feet 10/24/2015  .  Insomnia 10/24/2015  . RLS (restless legs syndrome) 10/24/2015  . Aggrieved 01/30/2013  . Clinical depression 11/16/2012  . Avitaminosis D 11/13/2012  . HLD (hyperlipidemia) 08/14/2012  . Seizure ([place]) 04/20/2012  . Diabetes mellitus ([place]) 04/19/2012    Past Surgical History:  Procedure Laterality Date  . none      Prior to Admission medications   Medication Sig Start Date End Date Taking? Authorizing Provider  Alpha-Lipoic Acid 600 MG CAPS Take 1 capsule (600 mg total) by mouth daily. 07/12/16  Yes Amy Overton Mam, NP  atorvastatin (LIPITOR) 20 MG tablet Take 1 tablet (20 mg total) by mouth daily. 08/17/16  Yes Amy Overton Mam, NP  busPIRone (BUSPAR) 10 MG tablet Take 1 tablet (10 mg total) by mouth 2 (two) times daily. 12/08/16  Yes Rainey Pines, MD  Cholecalciferol (VITAMIN D-3) 1000 UNITS CAPS Take 1 capsule by mouth daily.    Yes Historical Provider, MD  divalproex (DEPAKOTE) 125 MG DR tablet Take 125 mg by mouth 2 (two) times daily.  03/09/16  Yes Historical Provider, MD  escitalopram (LEXAPRO) 10 MG tablet Take 1 tablet (10 mg total) by mouth daily. 12/08/16  Yes Rainey Pines, MD  fluticasone (FLONASE) 50 MCG/ACT nasal spray Place 2 sprays into both nostrils daily. Use for 4-6 weeks then stop and use seasonally or as needed. 02/07/17  Yes Olin Hauser, DO  gabapentin (NEURONTIN)  100 MG capsule Take by mouth 2 (two) times daily.  01/10/17  Yes Historical Provider, MD  hydrOXYzine (VISTARIL) 25 MG capsule Take 1 capsule (25 mg total) by mouth 3 (three) times daily as needed. 05/30/16  Yes Jami L Hagler, PA-C  lamoTRIgine (LAMICTAL) 150 MG tablet Take 150 mg by mouth 2 (two) times daily.  07/20/16  Yes Historical Provider, MD  lisinopril (PRINIVIL,ZESTRIL) 2.5 MG tablet Take 1 tablet (2.5 mg total) by mouth daily. 11/25/15  Yes Amy Overton Mam, NP  meloxicam (MOBIC) 15 MG tablet Take 15 mg by mouth as needed. Reported on 06/07/2016   Yes Historical Provider, MD  metFORMIN  (GLUCOPHAGE) 500 MG tablet TAKE ONE TABLET BY MOUTH TWICE DAILY 09/03/16  Yes Amy Overton Mam, NP  phenytoin (DILANTIN) 200 MG ER capsule Take 200 mg by mouth 2 (two) times daily.  11/17/15  Yes Historical Provider, MD  QUEtiapine (SEROQUEL) 100 MG tablet Take 1 tablet (100 mg total) by mouth at bedtime. 12/08/16  Yes Rainey Pines, MD  rOPINIRole (REQUIP) 0.25 MG tablet TAKE TWO TABLETS BY MOUTH AT NIGHT FOR RESTLESS LEG 11/04/15  Yes Historical Provider, MD  terbinafine (LAMISIL) 250 MG tablet Take 1 tablet (250 mg total) by mouth daily. For 6 weeks, can refill for 6 additional weeks if not resolved 02/07/17  Yes Alexander Devin Going, DO  blood glucose meter kit and supplies KIT Dispense based on patient and insurance preference. Check blood glucose once daily. 11/25/15   Amy Overton Mam, NP  blood glucose meter kit and supplies Dispense based on patient and insurance preference. Use up to four times daily as directed. (FOR ICD-9 250.00, 250.01). 03/11/16   Arlis Porta., MD  clotrimazole (LOTRIMIN) 1 % cream Apply 1 application topically 2 (two) times daily. Reported on 06/07/2016    Historical Provider, MD  ondansetron (ZOFRAN-ODT) 4 MG disintegrating tablet Take 1 tablet (4 mg total) by mouth 2 (two) times daily. Patient not taking: Reported on 02/19/2017 06/07/16   Amy Overton Mam, NP    Allergies Patient has no known allergies.  Family History  Problem Relation Age of Onset  . Diabetes Mother   . Heart disease Mother   . Cancer Father     Social History Social History  Substance Use Topics  . Smoking status: Never Smoker  . Smokeless tobacco: Never Used  . Alcohol use No    Review of Systems Constitutional: No fever/chills Eyes: Positive visual changes. ENT: Positive for neck pain Cardiovascular: Denies chest pain. Respiratory: Denies shortness of breath. Gastrointestinal: No abdominal pain.  No nausea, no vomiting.  No diarrhea.  No constipation. Genitourinary: Negative  for dysuria. Musculoskeletal: Negative for back pain. Skin: Negative for rash. Neurological: Negative for headaches, focal weakness or numbness.  10-point ROS otherwise negative.  ____________________________________________   PHYSICAL EXAM:  VITAL SIGNS: ED Triage Vitals  Enc Vitals Group     BP 02/19/17 1112 131/80     Pulse Rate 02/19/17 1112 86     Resp 02/19/17 1112 20     Temp 02/19/17 1113 97.9 F (36.6 C)     Temp Source 02/19/17 1113 Oral     SpO2 02/19/17 1112 97 %     Weight 02/19/17 1054 184 lb (83.5 kg)     Height --      Head Circumference --      Peak Flow --      Pain Score 02/19/17 1055 6     Pain Loc --  Pain Edu? --      Excl. in [place]? --     Constitutional: Alert and oriented. Well appearing and in no acute distress. Eyes: Conjunctivae are normal. PERRL. EOMI.Pupils are 5 mm to 3 mm brisk and no injection   Visual Acuity  Right Eye Distance: 20/50 Left Eye Distance: 20/50 Bilateral Distance:    Right Eye Near:   Left Eye Near:    Bilateral Near:    Intraocular pressure 17 on the right and 20 on the left On ultrasound no retinal detachment no vitreous detachment and no vitreous hemorrhage  Head: Atraumatic. Nose: No congestion/rhinnorhea. Mouth/Throat: Mucous membranes are moist.  Oropharynx non-erythematous. No trismus Neck: No masses palpated full range of motion   Cardiovascular: Normal rate, regular rhythm. Grossly normal heart sounds.  Good peripheral circulation. Respiratory: Normal respiratory effort.  No retractions. Lungs CTAB. Gastrointestinal: Soft and nontender. No distention. No abdominal bruits. No CVA tenderness. Musculoskeletal: No lower extremity tenderness nor edema.  No joint effusions. Neurologic:  Normal speech and language. No gross focal neurologic deficits are appreciated. No gait instability. Skin:  Skin is warm, dry and intact. No rash noted. Psychiatric: Mood and affect are normal. Speech and behavior are  normal.  ____________________________________________   LABS (all labs ordered are listed, but only abnormal results are displayed)  Labs Reviewed  COMPREHENSIVE METABOLIC PANEL - Abnormal; Notable for the following:       Result Value   Glucose, Bld 123 (*)    ALT 13 (*)    All other components within normal limits  PROTIME-INR  CBC WITH DIFFERENTIAL/PLATELET   ____________________________________________  EKG   ____________________________________________  RADIOLOGY   ____________________________________________   PROCEDURES  Procedure(s) performed: no  Procedures  Critical Care performed: no  ____________________________________________   INITIAL IMPRESSION / ASSESSMENT AND PLAN / ED COURSE  Pertinent labs & imaging results that were available during my care of the patient were reviewed by me and considered in my medical decision making (see chart for details).  Of unclear etiology of the patient's ocular symptoms. At this time her visual acuity is essentially normal, her intraocular pressure is normal, she is in no pain, and her ultrasound is not consistent with a retinal detachment nor is her history. I also obtained a CT scan of the patient's neck given the diagnostic uncertainty in severity of pain. The CT is unremarkable. At this point I'm comfortable discharging the patient with ophthalmology follow-up on Monday for possible diabetic retinopathy and ENT follow-up as scheduled. She is medically stable for outpatient management understands and agrees the plan.      ____________________________________________   FINAL CLINICAL IMPRESSION(S) / ED DIAGNOSES  Final diagnoses:  Mass  Blurred vision  Neck pain      NEW MEDICATIONS STARTED DURING THIS VISIT:  Discharge Medication List as of 02/19/2017  3:12 PM       Note:  This document was prepared using Dragon voice recognition software and may include unintentional dictation errors.     Darel Hong, MD 02/20/17 1440

## 2017-02-19 NOTE — ED Notes (Signed)
Pt returned from CT at this time. This RN apologized for delay in checking on patient. Pt states understanding, requesting something to drink, this RN explained would need MD approval prior to given her something to drink. Pt states understanding.

## 2017-02-19 NOTE — ED Triage Notes (Signed)
Pt to ed with c/o blurred vision and neck pain x several days.

## 2017-02-19 NOTE — ED Notes (Signed)
Pt visualized in NAD. Resting in bed with eyes closed. Respirations even and unlabored. VSS and WNL.

## 2017-02-21 ENCOUNTER — Encounter: Payer: Self-pay | Admitting: Psychiatry

## 2017-02-21 ENCOUNTER — Other Ambulatory Visit: Payer: Self-pay | Admitting: Psychiatry

## 2017-02-21 ENCOUNTER — Ambulatory Visit (INDEPENDENT_AMBULATORY_CARE_PROVIDER_SITE_OTHER): Payer: Medicare Other | Admitting: Psychiatry

## 2017-02-21 VITALS — BP 130/71 | HR 82 | Temp 97.7°F | Wt 185.8 lb

## 2017-02-21 DIAGNOSIS — F3481 Disruptive mood dysregulation disorder: Secondary | ICD-10-CM

## 2017-02-21 MED ORDER — DIVALPROEX SODIUM 125 MG PO DR TAB: DELAYED_RELEASE_TABLET | ORAL | 1 refills | Status: AC

## 2017-02-21 MED ORDER — QUETIAPINE FUMARATE 100 MG PO TABS: 100.0000 mg | ORAL_TABLET | Freq: Every day | ORAL | 1 refills | Status: DC

## 2017-02-21 MED ORDER — ESCITALOPRAM OXALATE 10 MG PO TABS: 10.0000 mg | ORAL_TABLET | Freq: Every day | ORAL | 1 refills | Status: DC

## 2017-02-21 MED ORDER — QUETIAPINE FUMARATE 25 MG PO TABS: 25.0000 mg | ORAL_TABLET | Freq: Two times a day (BID) | ORAL | 1 refills | Status: DC

## 2017-02-21 NOTE — Progress Notes (Signed)
Psychiatric MD Progress NOTE  Patient Identification: Kaylee Wolf MRN:  [id number] Date of Evaluation:  02/21/2017 Referral Source: Primary care physician Chief Complaint:   Chief Complaint    Follow-up; Medication Refill     Visit Diagnosis:    ICD-9-CM ICD-10-CM   1. Disruptive mood dysregulation disorder ([place]) 296.99 F34.81    Diagnosis:   Patient Active Problem List   Diagnosis Date Noted  . Onychomycosis of left great toe [B35.1] 02/07/2017  . Allergic rhinitis due to allergen [J30.9] 02/07/2017  . Arthralgia of right temporomandibular joint [M26.621] 02/07/2017  . Blackout spell [R55] 07/20/2016  . 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 Recurrent major depressive disorder ([place]) [F33.9] 10/24/2015  . Varicose veins [I83.90] 10/24/2015  . Seizures ([place]) [R56.9] 10/24/2015  . Tinea pedis of both feet [B35.3] 10/24/2015  . Insomnia [G47.00] 10/24/2015  . RLS (restless legs syndrome) [G25.81] 10/24/2015  . Aggrieved [F43.20] 01/30/2013  . Clinical depression [F32.9] 11/16/2012  . Avitaminosis D [E55.9] 11/13/2012  . HLD (hyperlipidemia) [E78.5] 08/14/2012  . Seizure ([place]) [R56.9] 04/20/2012  . Diabetes mellitus ([place]) [E11.9] 04/19/2012   History of Present Illness:    Patient is a 66 year old widowed female who presented for follow-up accompanied by her daughter. Patient reported that she continues to have agitation anger and mood swings.Her daughter reported that she has been losing temper quickly. We discussed about her mood symptoms and her daughter reported that she has been loud and agitated. Patient reported that she was taking to the emergency room as she was agitated and they thought that it might be due to her seizure. She is currently on the combination of Depakote lamotrigine and phenytoin for her seizure disorder. She stated that she is also  taking Seroquel which is helping at this time. We discussed about her medications at length. She is agreeable and receptive to her medication adjusted and at this time.  Her daughter has been very helpful and we discussed about having more seizures. She will have a follow-up appointment with Dr. Melrose Nakayama in the next few weeks. Patient currently denied having any hallucinations. She does not have any suicidal ideations or plans.     Her daughter remains supportive.    Elements:  Severity:  moderate. Associated Signs/Symptoms: Depression Symptoms:  depressed mood, anhedonia, psychomotor agitation, fatigue, difficulty concentrating, anxiety, decreased appetite, (Hypo) Manic Symptoms:  Distractibility, Flight of Ideas, Impulsivity, Irritable Mood, Anxiety Symptoms:  Excessive Worry, Psychotic Symptoms:  none PTSD Symptoms: Negative NA  Past Medical History:  Past Medical History:  Diagnosis Date  . Anxiety   . Insomnia   . Mixed hyperlipidemia   . Recurrent major depressive disorder ([place])   . RLS (restless legs syndrome)   . Seizures ([place])   . Tinea pedis of both feet   . Type 2 diabetes mellitus ([place])   . Varicose veins     Past Surgical History:  Procedure Laterality Date  . none     Family History:  Family History  Problem Relation Age of Onset  . Diabetes Mother   . Heart disease Mother   . Cancer Father    Social History:   Social History   Social History  . Marital status: Widowed    Spouse name: N/A  . Number of children: N/A  . Years of education: N/A   Social History Main Topics  . Smoking status: Never Smoker  .  Smokeless tobacco: Never Used  . Alcohol use No  . Drug use: No  . Sexual activity: Not Currently   Other Topics Concern  . None   Social History Narrative  . None   Additional Social History:  Patient is currently widowed and lives by herself  Musculoskeletal: Strength & Muscle Tone: within normal limits Gait & Station:  normal Patient leans: N/A  Psychiatric Specialty Exam: Medication Refill     ROS  Blood pressure 130/71, pulse 82, temperature 97.7 F (36.5 C), temperature source Oral, weight 185 lb 12.8 oz (84.3 kg).Body mass index is 30.92 kg/m.  General Appearance: Casual and Fairly Groomed  Eye Contact:  Fair  Speech:  Clear and Coherent and Normal Rate  Volume:  Normal  Mood:  Anxious and Depressed  Affect:  Congruent  Thought Process:  Coherent  Orientation:  Full (Time, Place, and Person)  Thought Content:  WDL  Suicidal Thoughts:  No  Homicidal Thoughts:  No  Memory:  Immediate;   Fair  Judgement:  Intact  Insight:  Fair  Psychomotor Activity:  Decreased  Concentration:  Fair  Recall:  [employer] of Knowledge:Fair  Language: Fair  Akathisia:  No  Handed:  Right  AIMS (if indicated):    Assets:  Communication Skills Desire for Improvement Social Support Transportation  ADL's:  Intact  Cognition: WNL  Sleep:     Is the patient at risk to self?  No. Has the patient been a risk to self in the past 6 months?  No. Has the patient been a risk to self within the distant past?  No. Is the patient a risk to others?  No. Has the patient been a risk to others in the past 6 months?  No. Has the patient been a risk to others within the distant past?  No.  Allergies:  No Known Allergies Current Medications: Current Outpatient Prescriptions  Medication Sig Dispense Refill  . Alpha-Lipoic Acid 600 MG CAPS Take 1 capsule (600 mg total) by mouth daily. 30 each 11  . atorvastatin (LIPITOR) 20 MG tablet Take 1 tablet (20 mg total) by mouth daily. 90 tablet 3  . blood glucose meter kit and supplies KIT Dispense based on patient and insurance preference. Check blood glucose once daily. 1 each 0  . blood glucose meter kit and supplies Dispense based on patient and insurance preference. Use up to four times daily as directed. (FOR ICD-9 250.00, 250.01). 1 each 0  . Cholecalciferol (VITAMIN  D-3) 1000 UNITS CAPS Take 1 capsule by mouth daily.     . clotrimazole (LOTRIMIN) 1 % cream Apply 1 application topically 2 (two) times daily. Reported on 06/07/2016    . divalproex (DEPAKOTE) 125 MG DR tablet Take 1 pill in am and 2 pill at night 90 tablet 1  . escitalopram (LEXAPRO) 10 MG tablet Take 1 tablet (10 mg total) by mouth daily. 30 tablet 1  . fluticasone (FLONASE) 50 MCG/ACT nasal spray Place 2 sprays into both nostrils daily. Use for 4-6 weeks then stop and use seasonally or as needed. 16 g 3  . gabapentin (NEURONTIN) 100 MG capsule Take by mouth 2 (two) times daily.     . hydrOXYzine (VISTARIL) 25 MG capsule Take 1 capsule (25 mg total) by mouth 3 (three) times daily as needed. 21 capsule 0  . lamoTRIgine (LAMICTAL) 150 MG tablet Take 150 mg by mouth 2 (two) times daily.     [name] lisinopril (PRINIVIL,ZESTRIL) 2.5 MG tablet Take  1 tablet (2.5 mg total) by mouth daily. 90 tablet 3  . meloxicam (MOBIC) 15 MG tablet Take 15 mg by mouth as needed. Reported on 06/07/2016    . metFORMIN (GLUCOPHAGE) 500 MG tablet TAKE ONE TABLET BY MOUTH TWICE DAILY 60 tablet 11  . ondansetron (ZOFRAN-ODT) 4 MG disintegrating tablet Take 1 tablet (4 mg total) by mouth 2 (two) times daily. 20 tablet 0  . phenytoin (DILANTIN) 200 MG ER capsule Take 200 mg by mouth 2 (two) times daily.     . QUEtiapine (SEROQUEL) 100 MG tablet Take 1 tablet (100 mg total) by mouth at bedtime. 30 tablet 1  . rOPINIRole (REQUIP) 0.25 MG tablet TAKE TWO TABLETS BY MOUTH AT NIGHT FOR RESTLESS LEG    . terbinafine (LAMISIL) 250 MG tablet Take 1 tablet (250 mg total) by mouth daily. For 6 weeks, can refill for 6 additional weeks if not resolved 45 tablet 1  . QUEtiapine (SEROQUEL) 25 MG tablet Take 1 tablet (25 mg total) by mouth 2 (two) times daily after a meal. 60 tablet 1   No current facility-administered medications for this visit.     Previous Psychotropic Medications:no  Substance Abuse History in the last 12 months:   No.  Consequences of Substance Abuse: Negative NA  Medical Decision Making:  Review of Psycho-Social Stressors (1)  Treatment Plan Summary: Medication management   Discussed with patient what the medications treatment risks benefits and alternatives. I will  start her on Seroquel 25 mg by mouth twice a day when necessary for anxiety and agitation. Will change seroquel 100 mg daily at bedtime for the anxiety and mood symptoms Discontinue BuSpar Continue  Lexapro 10 mg daily.  Will titrate the dose of Depakote 125 mg in the morning and 250 at bedtime. Also advised that they should discuss with her neurologist about the combination of Depakote and lamotrigine as it might cause increased agitation They will discuss with Dr. Melrose Nakayama about the same. Also advised her to start taking vitamins including vitamin D and vitamin E  Follow-up in 1  months   More than 50% of the time spent in psychoeducation, counseling and coordination of care.    This note was generated in part or whole with voice recognition software. Voice regonition is usually quite accurate but there are transcription errors that can and very often do occur. I apologize for any typographical errors that were not detected and corrected.   Rainey Pines, MD  2/26/20183:48 PM

## 2017-03-16 ENCOUNTER — Ambulatory Visit: Payer: Medicare Other | Admitting: Psychiatry

## 2017-05-10 ENCOUNTER — Ambulatory Visit (INDEPENDENT_AMBULATORY_CARE_PROVIDER_SITE_OTHER): Payer: Medicare Other | Admitting: Nurse Practitioner

## 2017-05-10 ENCOUNTER — Ambulatory Visit: Payer: Self-pay | Admitting: Nurse Practitioner

## 2017-05-10 ENCOUNTER — Encounter: Payer: Self-pay | Admitting: Nurse Practitioner

## 2017-05-10 VITALS — BP 109/59 | HR 93 | Temp 99.0°F | Ht 65.0 in | Wt 189.0 lb

## 2017-05-10 DIAGNOSIS — E1142 Type 2 diabetes mellitus with diabetic polyneuropathy: Secondary | ICD-10-CM

## 2017-05-10 DIAGNOSIS — N951 Menopausal and female climacteric states: Secondary | ICD-10-CM

## 2017-05-10 DIAGNOSIS — N939 Abnormal uterine and vaginal bleeding, unspecified: Secondary | ICD-10-CM

## 2017-05-10 DIAGNOSIS — E1149 Type 2 diabetes mellitus with other diabetic neurological complication: Secondary | ICD-10-CM

## 2017-05-10 LAB — POCT GLYCOSYLATED HEMOGLOBIN (HGB A1C): Hemoglobin A1C: 6.2

## 2017-05-10 LAB — GLUCOSE, POCT (MANUAL RESULT ENTRY): POC Glucose: 97 mg/dl (ref 70–99)

## 2017-05-10 MED ORDER — ESCITALOPRAM OXALATE 20 MG PO TABS: 20.0000 mg | ORAL_TABLET | Freq: Every day | ORAL | 5 refills | Status: DC

## 2017-05-10 MED ORDER — GABAPENTIN 100 MG PO CAPS: 400.0000 mg | ORAL_CAPSULE | Freq: Two times a day (BID) | ORAL | 2 refills | Status: DC

## 2017-05-10 NOTE — Patient Instructions (Addendum)
Kaylee Wolf, Thank you for coming in to clinic today.  1. For your vaginal bleeding - We are going to get a transvaginal ultrasound. - Monitor for spotting and pelvic pain, difficulty urinating or bright red blood. If you have fever with chills/sweat that are different from your hot flashes call clinic.  2. For your foot/ leg pain - Increase your gabapentin to 3 tablets twice daily for 2 weeks. If not improving, take 4 tablets twice daily.  Then let me know how you are doing.  3. For your diabetes - We are checking an A1c and CBG today. - Work to eat about 30-45 mg carbohydrates or less with each meal. - Add veggies!  High fiber diets will have better glucose control. - If your A1c is high today, I will send you to diabetes education.  4. For your hot flashes: - We could consider hormonal treatment but this will increase your heart attack risk. - Increase your escitalopram to 20 mg once daily.  Take 2 pills of lexapro before you get your new bottle.  Then, only take 1 pill daily.  Please schedule a follow-up appointment with Cassell Smiles, AGNP to Return in about 4 weeks (around 06/07/2017) for 2-4 weeks for Diabetes, 5 weeks for medicare wellness, 6 weeks for annual physical..  If you have any other questions or concerns, please feel free to call the clinic or send a message through [place]. You may also schedule an earlier appointment if necessary.  Cassell Smiles, DNP, AGNP-BC Adult Gerontology Nurse Practitioner [facility], [facility]    High-Fiber Diet Fiber, also called dietary fiber, is a type of carbohydrate found in fruits, vegetables, whole grains, and beans. A high-fiber diet can have many health benefits. Your health care provider may recommend a high-fiber diet to help:  Prevent constipation. Fiber can make your bowel movements more regular.  Lower your cholesterol.  Relieve hemorrhoids, uncomplicated diverticulosis, or irritable bowel syndrome.  Prevent  overeating as part of a weight-loss plan.  Prevent heart disease, type 2 diabetes, and certain cancers. What is my plan? The recommended daily intake of fiber includes:  38 grams for men under age 35.  38 grams for men over age 28.  81 grams for women under age 43.  58 grams for women over age 32. You can get the recommended daily intake of dietary fiber by eating a variety of fruits, vegetables, grains, and beans. Your health care provider may also recommend a fiber supplement if it is not possible to get enough fiber through your diet. What do I need to know about a high-fiber diet?  Fiber supplements have not been widely studied for their effectiveness, so it is better to get fiber through food sources.  Always check the fiber content on thenutrition facts label of any prepackaged food. Look for foods that contain at least 5 grams of fiber per serving.  Ask your dietitian if you have questions about specific foods that are related to your condition, especially if those foods are not listed in the following section.  Increase your daily fiber consumption gradually. Increasing your intake of dietary fiber too quickly may cause bloating, cramping, or gas.  Drink plenty of water. Water helps you to digest fiber. What foods can I eat? Grains  Whole-grain breads. Multigrain cereal. Oats and oatmeal. Brown rice. Barley. Bulgur wheat. [place]. Bran muffins. Popcorn. Rye wafer crackers. Vegetables  Sweet potatoes. Spinach. Kale. Artichokes. Cabbage. Broccoli. Green peas. Carrots. Squash. Fruits  Berries. Pears. Apples.  Oranges. Avocados. Prunes and raisins. Dried figs. Meats and Other Protein Sources  [place], kidney, pinto, and soy beans. Split peas. Lentils. Nuts and seeds. Dairy  Fiber-fortified yogurt. Beverages  Fiber-fortified soy milk. Fiber-fortified orange juice. Other  Fiber bars. The items listed above may not be a complete list of recommended foods or beverages. Contact  your dietitian for more options.  What foods are not recommended? Grains  White bread. Pasta made with refined flour. White rice. Vegetables  Fried potatoes. Canned vegetables. Well-cooked vegetables. Fruits  Fruit juice. Cooked, strained fruit. Meats and Other Protein Sources  Fatty cuts of meat. Fried Sales executive or fried fish. Dairy  Milk. Yogurt. Cream cheese. Sour cream. Beverages  Soft drinks. Other  Cakes and pastries. Butter and oils. The items listed above may not be a complete list of foods and beverages to avoid. Contact your dietitian for more information.  What are some tips for including high-fiber foods in my diet?  Eat a wide variety of high-fiber foods.  Make sure that half of all grains consumed each day are whole grains.  Replace breads and cereals made from refined flour or white flour with whole-grain breads and cereals.  Replace white rice with brown rice, bulgur wheat, or millet.  Start the day with a breakfast that is high in fiber, such as a cereal that contains at least 5 grams of fiber per serving.  Use beans in place of meat in soups, salads, or pasta.  Eat high-fiber snacks, such as berries, raw vegetables, nuts, or popcorn. This information is not intended to replace advice given to you by your health care provider. Make sure you discuss any questions you have with your health care provider. Document Released: 12/13/2005 Document Revised: 05/20/2016 Document Reviewed: 05/28/2014 Elsevier Interactive Patient Education  2017 [place] for Diabetes Mellitus, Adult Carbohydrate counting is a method for keeping track of how many carbohydrates you eat. Eating carbohydrates naturally increases the amount of sugar (glucose) in the blood. Counting how many carbohydrates you eat helps keep your blood glucose within normal limits, which helps you manage your diabetes (diabetes mellitus). It is important to know how many carbohydrates  you can safely have in each meal. This is different for every person. A diet and nutrition specialist (registered dietitian) can help you make a meal plan and calculate how many carbohydrates you should have at each meal and snack. Carbohydrates are found in the following foods:  Grains, such as breads and cereals.  Dried beans and soy products.  Starchy vegetables, such as potatoes, peas, and corn.  Fruit and fruit juices.  Milk and yogurt.  Sweets and snack foods, such as cake, cookies, candy, chips, and soft drinks. How do I count carbohydrates? There are two ways to count carbohydrates in food. You can use either of the methods or a combination of both. Reading "Nutrition Facts" on packaged food  The "Nutrition Facts" list is included on the labels of almost all packaged foods and beverages in the [place] It includes:  The serving size.  Information about nutrients in each serving, including the grams (g) of carbohydrate per serving. To use the "Nutrition Facts":  Decide how many servings you will have.  Multiply the number of servings by the number of carbohydrates per serving.  The resulting number is the total amount of carbohydrates that you will be having. Learning standard serving sizes of other foods  When you eat foods containing carbohydrates that are not packaged or  do not include "Nutrition Facts" on the label, you need to measure the servings in order to count the amount of carbohydrates:  Measure the foods that you will eat with a food scale or measuring cup, if needed.  Decide how many standard-size servings you will eat.  Multiply the number of servings by 15. Most carbohydrate-rich foods have about 15 g of carbohydrates per serving.  For example, if you eat 8 oz (170 g) of strawberries, you will have eaten 2 servings and 30 g of carbohydrates (2 servings x 15 g = 30 g).  For foods that have more than one food mixed, such as soups and casseroles, you must count  the carbohydrates in each food that is included. The following list contains standard serving sizes of common carbohydrate-rich foods. Each of these servings has about 15 g of carbohydrates:   hamburger bun or  English muffin.   oz (15 mL) syrup.   oz (14 g) jelly.  1 slice of bread.  1 six-inch tortilla.  3 oz (85 g) cooked rice or pasta.  4 oz (113 g) cooked dried beans.  4 oz (113 g) starchy vegetable, such as peas, corn, or potatoes.  4 oz (113 g) hot cereal.  4 oz (113 g) mashed potatoes or  of a large baked potato.  4 oz (113 g) canned or frozen fruit.  4 oz (120 mL) fruit juice.  4-6 crackers.  6 chicken nuggets.  6 oz (170 g) unsweetened dry cereal.  6 oz (170 g) plain fat-free yogurt or yogurt sweetened with artificial sweeteners.  8 oz (240 mL) milk.  8 oz (170 g) fresh fruit or one small piece of fruit.  24 oz (680 g) popped popcorn. Example of carbohydrate counting Sample meal   3 oz (85 g) chicken breast.  6 oz (170 g) brown rice.  4 oz (113 g) corn.  8 oz (240 mL) milk.  8 oz (170 g) strawberries with sugar-free whipped topping. Carbohydrate calculation  1. Identify the foods that contain carbohydrates:  Rice.  Corn.  Milk.  Strawberries. 2. Calculate how many servings you have of each food:  2 servings rice.  1 serving corn.  1 serving milk.  1 serving strawberries. 3. Multiply each number of servings by 15 g:  2 servings rice x 15 g = 30 g.  1 serving corn x 15 g = 15 g.  1 serving milk x 15 g = 15 g.  1 serving strawberries x 15 g = 15 g. 4. Add together all of the amounts to find the total grams of carbohydrates eaten:  30 g + 15 g + 15 g + 15 g = 75 g of carbohydrates total. This information is not intended to replace advice given to you by your health care provider. Make sure you discuss any questions you have with your health care provider. Document Released: 12/13/2005 Document Revised: 07/02/2016 Document  Reviewed: 05/26/2016 Elsevier Interactive Patient Education  2017 [employer].

## 2017-05-10 NOTE — Progress Notes (Signed)
Subjective:    Patient ID: Kaylee Wolf, female    DOB: December 21, 1951, 66 y.o.   MRN: [id number]  Kaylee Wolf is a 66 y.o. female presenting on 05/10/2017 for Vaginal Bleeding (post menopausal bleeding,bilateral leg burn pain w/ numbness in the right toes)  [name], pts daughter is with her today.  HPI  Vaginal bleeding Blood on Sun, Mon 1 week ago.  Small amount of blood brown/red on toilet paper when wiping.   Small amount of pain on Left side of pelvis Monday.  Nausea on Monday.  Had sweats, and normally has hot flashes with soaking sweat. No blood in toilet.  Since 1 week ago no bleeding and no pain.  Diabetes Does not check CBG at home. Pt denies polydipsia, polyphagia, polyuria, diaphoresis, shakiness, chills, pain, numbness or tingling in extremities, and changes in vision. Headaches - with nausea. Occurs 1x per week.  Doesn't need to go to bed for relief. Last eye exam  07/2016.    Ate TV dinner at 11:30 for lunch.  Pt and daughter express need for information about diabetes management for diet and how to check CBG.  Burning in Toes Takes gabapentin 100 mg twice daily.  Burns most at night when sits down after standing during the day.  Worsening pain for the last several weeks increasing to daily for the last week.  Daughter lives next door and can hear pt fussing about her feet hurting from her home.  Hot flashes  Pours sweat 2 times daily. Have done this for > 1 year.  Currently on lexapro 10 mg.  Social History  Substance Use Topics  . Smoking status: Current Every Day Smoker    Types: Cigarettes  . Smokeless tobacco: Never Used  . Alcohol use No    Review of Systems  Constitutional: Positive for diaphoresis.       Related to post-menopausal hot flashes  HENT: Negative.   Eyes: Negative.   Respiratory: Negative.   Cardiovascular: Negative.   Gastrointestinal: Negative.   Endocrine: Negative.   Skin: Negative.   Allergic/Immunologic: Negative.     Neurological: Positive for numbness.       Numbness and tingling of feet  Hematological: Negative.   Psychiatric/Behavioral: Negative.    Per HPI unless specifically indicated above     Objective:    BP (!) 109/59   Pulse 93   Temp 99 F (37.2 C) (Oral)   Ht 5\' 5"  (1.651 m)   Wt 189 lb (85.7 kg)   LMP 05/10/2012   BMI 31.45 kg/m   Wt Readings from Last 3 Encounters:  05/10/17 189 lb (85.7 kg)  02/21/17 185 lb 12.8 oz (84.3 kg)  02/19/17 184 lb (83.5 kg)    Physical Exam  Constitutional: She is oriented to person, place, and time. She appears well-developed and well-nourished. No distress.  HENT:  Head: Normocephalic and atraumatic.  Eyes: Conjunctivae are normal. Pupils are equal, round, and reactive to light.  Cardiovascular: Normal rate, regular rhythm, normal heart sounds and intact distal pulses.   Pulmonary/Chest: Effort normal and breath sounds normal.  Abdominal: Soft. Bowel sounds are normal. She exhibits no distension and no mass. There is no tenderness.  Musculoskeletal: Normal range of motion.  Neurological: She is alert and oriented to person, place, and time.  Skin: Skin is warm and dry.  Psychiatric: She has a normal mood and affect. Her behavior is normal. Judgment and thought content normal.    Diabetic Foot Exam - Simple  Simple Foot Form Diabetic Foot exam was performed with the following findings:  Yes 05/10/2017  4:00 PM  Visual Inspection No deformities, no ulcerations, no other skin breakdown bilaterally:  Yes Sensation Testing Intact to touch and monofilament testing bilaterally:  Yes Pulse Check Posterior Tibialis and Dorsalis pulse intact bilaterally:  Yes Comments Bunion bilaterally with callus Fungal dermatitis R dorsal foot near 3-5 digits.  No itching or redness.      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  Bilateral peripheral diabetic neuropathy worsening.  Pt and daughter with knowledge deficit related to checking home CBG and nutrition.  Plan: 1.    Relevant Orders   POCT HgB A1C (Completed)   POCT Glucose (CBG) (Completed)    Other Visit Diagnoses    Vaginal bleeding    -  Primary Abnormal vaginal bleeding in postmenopausal woman.  No surgical removal of uterus or ovaries.  Symptoms consistent with ovarian cyst vs other bleeding source.  Plan: 1. Transvaginal US evaluate uterus, endometrium, and ovaries. 2. Educated pt about need to monitor additional bleeding and contact clinic.   Relevant Orders   US Transvaginal Non-OB   Other diabetic neurological complication associated with type 2 diabetes mellitus ([place])     Neuropathy in bilateral extremities worsening.  On gabapentin, but no relief with medication after worsening pain.  Currently taking 2 pills twice daily for a total daily dose of 400 mg gabapentin.  Plan: 1. Increase gabapentin does to 300 mg bid for 2 weeks.  If not improving, take 4 tablets twice daily for 2 weeks.  Then, contact clinic if pain persists.  Consider TID dosing.   Relevant Medications   gabapentin (NEURONTIN) 100 MG capsule   Hot flashes due to menopause     Pt desires treatment for persistent hot flashes with drenching sweats and increased irritability.  Plan: 1. Pt prescribed escitalopram 10 mg once daily.  Increase to 20 mg once daily.   UPDATE: once patient home, realized not taking escitalopram.  START taking escitalopram 10 mg once daily.  Rx already received for escitalopram 20 mg tablet.  Instructed to take 1/2 tablet for total of 10 mg once daily.        Meds ordered this encounter  Medications  . DISCONTD:  escitalopram (LEXAPRO) 20 MG tablet    Sig: Take 1 tablet (20 mg total) by mouth daily.    Dispense:  30 tablet    Refill:  5    Order Specific Question:   Supervising Provider    Answer:   Olin Hauser [[id number]]  . gabapentin (NEURONTIN) 100 MG capsule    Sig: Take 4 capsules (400 mg total) by mouth 2 (two) times daily.    Dispense:  180 capsule    Refill:  2    Order Specific Question:   Supervising Provider    Answer:   Olin Hauser [[id number]]      Follow up plan: Return in about 4 weeks (around 06/07/2017) for 2-4 weeks for Diabetes, 5 weeks for medicare wellness,  6 weeks for annual physical..  Cassell Smiles, DNP, AGPCNP-BC Adult Gerontology Primary Care Nurse Practitioner [facility] 05/11/2017, 11:02 PM

## 2017-05-11 ENCOUNTER — Telehealth: Payer: Self-pay | Admitting: Family Medicine

## 2017-05-11 DIAGNOSIS — N951 Menopausal and female climacteric states: Secondary | ICD-10-CM

## 2017-05-11 MED ORDER — ESCITALOPRAM OXALATE 10 MG PO TABS: 10.0000 mg | ORAL_TABLET | Freq: Every day | ORAL | 5 refills | Status: DC

## 2017-05-11 NOTE — Telephone Encounter (Signed)
Pt  Daughters called states that pt was not taking Lexapro   But a  prescription was written yesterday was not sure what she needs to do.

## 2017-05-11 NOTE — Telephone Encounter (Signed)
Med list included escitalopram as medication pt was taking.  Once home, pt and [name] realized pt had not been taking escitalopram at all.  Pt states it was related to Dr. Gretel Acre telling her it could raise her blood sugar.  Encouraged pt and daughter that we would manage changes in blood sugar as they were likely to be very small.  Start taking escitalopram 10 mg once daily.  Stop escitalopram 20 mg tablet once daily because not previously on medication.

## 2017-05-12 NOTE — Progress Notes (Signed)
I have reviewed this encounter including the documentation in this note and/or discussed this patient with the provider, Cassell Smiles, AGPCNP-BC. I am certifying that I agree with the content of this note as supervising physician.  Nobie Putnam, [facility] 05/12/2017, 9:54 AM

## 2017-05-13 ENCOUNTER — Telehealth: Payer: Self-pay | Admitting: Family Medicine

## 2017-05-13 ENCOUNTER — Other Ambulatory Visit: Payer: Self-pay

## 2017-05-13 DIAGNOSIS — N950 Postmenopausal bleeding: Secondary | ICD-10-CM

## 2017-05-17 ENCOUNTER — Ambulatory Visit
Admission: RE | Admit: 2017-05-17 | Discharge: 2017-05-17 | Disposition: A | Discharge status: Home / Self Care | Payer: Medicare Other | Source: Ambulatory Visit | Attending: Nurse Practitioner | Admitting: Nurse Practitioner

## 2017-05-17 DIAGNOSIS — N950 Postmenopausal bleeding: Secondary | ICD-10-CM | POA: Insufficient documentation

## 2017-05-17 DIAGNOSIS — N939 Abnormal uterine and vaginal bleeding, unspecified: Secondary | ICD-10-CM

## 2017-05-26 ENCOUNTER — Other Ambulatory Visit: Payer: Self-pay | Admitting: Nurse Practitioner

## 2017-05-26 DIAGNOSIS — Z0000 Encounter for general adult medical examination without abnormal findings: Secondary | ICD-10-CM

## 2017-05-26 DIAGNOSIS — E782 Mixed hyperlipidemia: Secondary | ICD-10-CM

## 2017-05-26 DIAGNOSIS — E1142 Type 2 diabetes mellitus with diabetic polyneuropathy: Secondary | ICD-10-CM

## 2017-06-07 ENCOUNTER — Ambulatory Visit: Payer: Self-pay | Admitting: Nurse Practitioner

## 2017-06-14 ENCOUNTER — Ambulatory Visit: Payer: Self-pay

## 2017-06-21 ENCOUNTER — Other Ambulatory Visit: Payer: Self-pay | Admitting: Psychiatry

## 2017-06-30 ENCOUNTER — Ambulatory Visit (INDEPENDENT_AMBULATORY_CARE_PROVIDER_SITE_OTHER): Payer: Medicare Other | Admitting: Nurse Practitioner

## 2017-06-30 ENCOUNTER — Other Ambulatory Visit: Payer: Self-pay | Admitting: Nurse Practitioner

## 2017-06-30 ENCOUNTER — Encounter: Payer: Self-pay | Admitting: Nurse Practitioner

## 2017-06-30 VITALS — BP 113/65 | HR 65 | Temp 97.7°F | Resp 16 | Ht 65.0 in | Wt 186.0 lb

## 2017-06-30 DIAGNOSIS — Z0000 Encounter for general adult medical examination without abnormal findings: Secondary | ICD-10-CM

## 2017-06-30 DIAGNOSIS — E559 Vitamin D deficiency, unspecified: Secondary | ICD-10-CM

## 2017-06-30 DIAGNOSIS — Z79899 Other long term (current) drug therapy: Secondary | ICD-10-CM | POA: Diagnosis not present

## 2017-06-30 DIAGNOSIS — Z13220 Encounter for screening for lipoid disorders: Secondary | ICD-10-CM

## 2017-06-30 DIAGNOSIS — R799 Abnormal finding of blood chemistry, unspecified: Secondary | ICD-10-CM | POA: Diagnosis not present

## 2017-06-30 DIAGNOSIS — Z1239 Encounter for other screening for malignant neoplasm of breast: Secondary | ICD-10-CM

## 2017-06-30 DIAGNOSIS — Z1231 Encounter for screening mammogram for malignant neoplasm of breast: Secondary | ICD-10-CM | POA: Diagnosis not present

## 2017-06-30 DIAGNOSIS — Z114 Encounter for screening for human immunodeficiency virus [HIV]: Secondary | ICD-10-CM

## 2017-06-30 DIAGNOSIS — Z1211 Encounter for screening for malignant neoplasm of colon: Secondary | ICD-10-CM | POA: Diagnosis not present

## 2017-06-30 DIAGNOSIS — Z1159 Encounter for screening for other viral diseases: Secondary | ICD-10-CM

## 2017-06-30 DIAGNOSIS — F419 Anxiety disorder, unspecified: Secondary | ICD-10-CM

## 2017-06-30 DIAGNOSIS — Z124 Encounter for screening for malignant neoplasm of cervix: Secondary | ICD-10-CM | POA: Diagnosis not present

## 2017-06-30 MED ORDER — DTAP-IPV VACCINE IM SUSP: 0.5000 mL | Freq: Once | INTRAMUSCULAR | 0 refills | Status: AC

## 2017-06-30 MED ORDER — ESCITALOPRAM OXALATE 20 MG PO TABS: 20.0000 mg | ORAL_TABLET | Freq: Every day | ORAL | 1 refills | Status: DC

## 2017-06-30 NOTE — Patient Instructions (Addendum)
Abbie, Thank you for coming in to clinic today.  1. We have done your pap smear today.  You will get these results in the next 3-4 days.  2. You need a mammogram.  Please call to schedule your own appointment.  The [facility] in [place] is at [phone].  3. For your blood work: You will be due for FASTING BLOOD WORK (no food or drink after midnight before, only water or coffee without cream/sugar on the morning of)  - Please go ahead and schedule a "Lab Only" visit in the morning at the clinic for lab draw tomorrow.  For Lab Results, once available within 2-3 days of blood draw, you can can log in to MyChart online to view your results and a brief explanation. Also, we can discuss results at next follow-up visit.  4. For your being "ill" - Take escitalopram 10 mg tablet - Until you get your new bottle, take two tablets for 20 mg total daily.  - When you get your new prescription, take one tablet escitalopram once daily.  5. I sent an order for tetanus vaccine to [facility].  You can get it administered at [facility] or bring it to the clinic. If you don't get it a [facility], we can give it to you if you get a cut.  Medicare will pay for it if you are cut, so come to the clinic for an immunization appointment.  6. Cologuard colon cancer screening kit will be sent to your home.  Please read the instructions, and send it back to the company.  We will contact you with your results once you have sent your sample.  Please schedule a follow-up appointment with Cassell Smiles, AGNP to Return in about 6 weeks (around 08/11/2017) for anxiety.  If you have any other questions or concerns, please feel free to call the clinic or send a message through [facility]. You may also schedule an earlier appointment if necessary.  Cassell Smiles, DNP, AGNP-BC Adult Gerontology Nurse Practitioner [place]

## 2017-06-30 NOTE — Progress Notes (Signed)
Subjective:    Patient ID: Kaylee Wolf, female    DOB: 07/13/1951, 66 y.o.   MRN: [id number]  Kaylee Wolf is a 66 y.o. female presenting on 06/30/2017 for Annual Exam (as per patient she feels very exhausted from last couple of weeks)   HPI Patient has been feeling well except being "ill". "ill" feeling is pt's explanation of being moody, irritable, and easily angered.  She has no other acute concerns today. Sleeps 7 hours per night interrupted 4-5 times for urination w/o difficulty returning to sleep.  Health Maintenance Weight/BMI: overweight,  Physical activity: walks from her home to daughters home about 1/4 mile daily Diet: eats no breakfast, eats lunch, supper and occasional snacks.  Avoids candy.  Potato chips  Seatbelt: always Sunscreen: never PAP: Unknown last date.  Due today. Mammogram: due today Colonoscopy: due today - Cologuard Tetanus: due today - no recent laceration or break in skin pt will obtain at local pharmacy Pneumonia vaccine: - defer x 1 year per pt request   Anxiety Stays "ill"  In a bad mood, sometimes angry, easily irritated. Sometimes feels down, but does not stay in a down or depressed mood. Pt still enjoys activities.  She watches DVDs, works in [employer].  Other times wants to just be by herself, but denies anhedonia.  Depression screen [facility] 2/9 06/30/2017 06/30/2017 06/07/2016 12/30/2015 10/24/2015  Decreased Interest 0 0 0 0 0  Down, Depressed, Hopeless 1 0 0 2 0  PHQ - 2 Score 1 0 0 2 0  Altered sleeping 0 - - 3 -  Tired, decreased energy 2 - - 2 -  Change in appetite 0 - - 3 -  Feeling bad or failure about yourself  0 - - 3 -  Trouble concentrating 0 - - 1 -  Moving slowly or fidgety/restless 0 - - 2 -  Suicidal thoughts 0 - - 0 -  PHQ-9 Score 3 - - 16 -  Difficult doing work/chores - - - Very difficult -   GAD 7 : Generalized Anxiety Score 06/30/2017  Nervous, Anxious, on Edge 1  Control/stop worrying 2  Worry too much - different  things 3  Trouble relaxing 3  Restless 3  Easily annoyed or irritable 2  Afraid - awful might happen 0  Total GAD 7 Score 14     Past Medical History:  Diagnosis Date  . Anxiety   . Insomnia   . Mixed hyperlipidemia   . Recurrent major depressive disorder ([place])   . RLS (restless legs syndrome)   . Seizures ([place])   . Tinea pedis of both feet   . Type 2 diabetes mellitus ([place])   . Varicose veins    Past Surgical History:  Procedure Laterality Date  . none     Social History   Social History  . Marital status: Widowed    Spouse name: N/A  . Number of children: N/A  . Years of education: N/A   Occupational History  . Not on file.   Social History Main Topics  . Smoking status: Former Smoker    Types: Cigarettes  . Smokeless tobacco: Former Systems developer  . Alcohol use No  . Drug use: No  . Sexual activity: Not Currently   Other Topics Concern  . Not on file   Social History Narrative  . No narrative on file   Family History  Problem Relation Age of Onset  . Diabetes Mother   . Heart disease Mother   .  Cancer Father    Current Outpatient Prescriptions on File Prior to Visit  Medication Sig  . Alpha-Lipoic Acid 600 MG CAPS Take 1 capsule (600 mg total) by mouth daily.  [name] atorvastatin (LIPITOR) 20 MG tablet Take 1 tablet (20 mg total) by mouth daily.  . blood glucose meter kit and supplies KIT Dispense based on patient and insurance preference. Check blood glucose once daily.  . blood glucose meter kit and supplies Dispense based on patient and insurance preference. Use up to four times daily as directed. (FOR ICD-9 250.00, 250.01).  . Cholecalciferol (VITAMIN D-3) 1000 UNITS CAPS Take 1 capsule by mouth daily.   . clotrimazole (LOTRIMIN) 1 % cream Apply 1 application topically 2 (two) times daily. Reported on 06/07/2016  . divalproex (DEPAKOTE) 125 MG DR tablet Take 1 pill in am and 2 pill at night  . fluticasone (FLONASE) 50 MCG/ACT nasal spray Place 2 sprays into  both nostrils daily. Use for 4-6 weeks then stop and use seasonally or as needed.  . gabapentin (NEURONTIN) 100 MG capsule Take 4 capsules (400 mg total) by mouth 2 (two) times daily.  . hydrOXYzine (VISTARIL) 25 MG capsule Take 1 capsule (25 mg total) by mouth 3 (three) times daily as needed.  . lamoTRIgine (LAMICTAL) 150 MG tablet Take 150 mg by mouth 2 (two) times daily.   [name] lisinopril (PRINIVIL,ZESTRIL) 2.5 MG tablet Take 1 tablet (2.5 mg total) by mouth daily.  . meloxicam (MOBIC) 15 MG tablet Take 15 mg by mouth as needed. Reported on 06/07/2016  . metFORMIN (GLUCOPHAGE) 500 MG tablet TAKE ONE TABLET BY MOUTH TWICE DAILY  . ondansetron (ZOFRAN-ODT) 4 MG disintegrating tablet Take 1 tablet (4 mg total) by mouth 2 (two) times daily.  . phenytoin (DILANTIN) 200 MG ER capsule Take 200 mg by mouth 2 (two) times daily.   . QUEtiapine (SEROQUEL) 100 MG tablet TAKE 1 TABLET BY MOUTH AT BEDTIME  . QUEtiapine (SEROQUEL) 25 MG tablet Take 1 tablet (25 mg total) by mouth 2 (two) times daily after a meal.  . rOPINIRole (REQUIP) 0.25 MG tablet TAKE TWO TABLETS BY MOUTH AT NIGHT FOR RESTLESS LEG  . terbinafine (LAMISIL) 250 MG tablet Take 1 tablet (250 mg total) by mouth daily. For 6 weeks, can refill for 6 additional weeks if not resolved   No current facility-administered medications on file prior to visit.     Review of Systems Per HPI unless specifically indicated above     Objective:    BP 113/65   Pulse 65   Temp 97.7 F (36.5 C) (Oral)   Resp 16   Ht '5\' 5"'  (1.651 m)   Wt 186 lb (84.4 kg)   LMP 05/10/2012   BMI 30.95 kg/m    Wt Readings from Last 3 Encounters:  06/30/17 186 lb (84.4 kg)  05/10/17 189 lb (85.7 kg)  02/19/17 184 lb (83.5 kg)    Physical Exam General - healthy, well-appearing, NAD HEENT - Normocephalic, atraumatic, PERRL, EOMI, patent nares w/o congestion, oropharynx clear, MMM Neck - supple, non-tender, no LAD, no thyromegaly Heart - RRR, bradycardia, no murmurs  heard Lungs - Clear throughout all lobes, no wheezing, crackles, or rhonchi. Normal work of breathing. Abdomen - soft, NTND, no masses, no hepatosplenomegaly, active bowel sounds GU - Normal external female genitalia without lesions or fusion. Vaginal canal without lesions. Normal appearing cervix without lesions or friability. Physiologic discharge on exam. Bimanual exam without adnexal masses, enlarged uterus, or cervical motion tenderness. -  Pelvic Exam chaperoned and assisted by Frederich Cha, CMA Extremeties - non-tender, no edema, cap refill < 2 seconds, peripheral pulses intact +2 bilaterally Skin - warm, dry, no rashes Neuro - awake, alert, oriented, CN II-X intact, intact muscle strength 5/5 bilaterally, intact distal sensation to light touch, normal coordination, normal gait Psych - Normal mood and affect, normal behavior    Results for orders placed or performed in visit on 05/10/17  POCT HgB A1C  Result Value Ref Range   Hemoglobin A1C 6.2   POCT Glucose (CBG)  Result Value Ref Range   POC Glucose 97 70 - 99 mg/dl      Assessment & Plan:   Problem List Items Addressed This Visit      Other   Avitaminosis D    PT w/ history of low vitamin D.   Plan:  1. Screen for ongoing deficiency and treat w/ replacement if necessary.      Relevant Orders   Vitamin D (25 hydroxy)   Anxiety    Pt w/ anxiety as evidenced by GAD7 scoring = 14.  Pt already taking escitalopram 10 mg once daily and tolerating well w/o side effects.  Plan: 1. Increase dose of escitalopram to 20 mg once daily. 2. Follow up 6 weeks.      Relevant Medications   escitalopram (LEXAPRO) 20 MG tablet    Other Visit Diagnoses    Encounter for annual physical exam    -  Primary   Pt well appearing today.  New symptoms of anxiety requiring treatment.  Plan: 1. Obtain screening labs and tests. 2. Follow up 1 year for repeat physical.   Relevant Orders   Vitamin D (25 hydroxy)   BASIC METABOLIC PANEL  WITH GFR   Hemoglobin A1c   TSH   Colon cancer screening       Pt needs colon cancer screening. Discussed w/ pt.  Pt prefers cologuard over colonoscopy.  Plan: 1. Send cologuard screening kit.   Relevant Orders   Cologuard   Screening cholesterol level       Pt needs screening for hyperlipidemia.  Plan: 1. Obtain Lipid panel fasting.   Breast cancer screening       Pt due for repeat mammogram.    Plan: 1. Screening mammogram ordered. Pt given information to call and schedule appt.   Relevant Orders   MM DIGITAL SCREENING BILATERAL   Cervical cancer screening       Pt needs cervical cancer screening.  Plan: 1. Perform PAP w/ HPV.  Likely will be last PAP smear if normal.   Relevant Orders   PAP, Thin Prep w/HPV rflx HPV Type 16/18   Encounter for long-term current use of high risk medication       Relevant Orders   Vitamin D (25 hydroxy)   BASIC METABOLIC PANEL WITH GFR   Hemoglobin A1c   TSH   CBC   Abnormal blood chemistry       Relevant Orders   Vitamin D (25 hydroxy)   BASIC METABOLIC PANEL WITH GFR   Hemoglobin A1c   TSH   CBC   Encounter for hepatitis C screening test for low risk patient       Pt low risk for hep C, but in age range requiring screening.  Pt desires screening.  Plan: 1. Obtain Hep C ab screening   Relevant Orders   Hepatitis C Antibody   Screening for HIV without presence of risk factors  Pt low risk for HIV, but desires testing to rule out.  Plan: 1. Obtain HIV ab testing.   Relevant Orders   HIV antibody      Meds ordered this encounter  Medications  . escitalopram (LEXAPRO) 20 MG tablet    Sig: Take 1 tablet (20 mg total) by mouth daily.    Dispense:  30 tablet    Refill:  1    Order Specific Question:   Supervising Provider    Answer:   Olin Hauser [[id number]]  . DTaP-IPV Lynann Bologna) injection    Sig: Inject 0.5 mLs into the muscle once.    Dispense:  0.5 mL    Refill:  0    Order Specific Question:    Supervising Provider    Answer:   Olin Hauser [[id number]]      Follow up plan: Return in about 6 weeks (around 08/11/2017) for anxiety.  Cassell Smiles, DNP, AGPCNP-BC Adult Gerontology Primary Care Nurse Practitioner [facility] 07/03/2017, 11:22 PM

## 2017-07-01 ENCOUNTER — Other Ambulatory Visit: Payer: Self-pay

## 2017-07-03 DIAGNOSIS — F419 Anxiety disorder, unspecified: Secondary | ICD-10-CM | POA: Insufficient documentation

## 2017-07-03 NOTE — Assessment & Plan Note (Signed)
PT w/ history of low vitamin D.   Plan:  1. Screen for ongoing deficiency and treat w/ replacement if necessary.

## 2017-07-03 NOTE — Assessment & Plan Note (Signed)
Pt w/ anxiety as evidenced by GAD7 scoring = 14.  Pt already taking escitalopram 10 mg once daily and tolerating well w/o side effects.  Plan: 1. Increase dose of escitalopram to 20 mg once daily. 2. Follow up 6 weeks.

## 2017-07-04 ENCOUNTER — Other Ambulatory Visit: Payer: Self-pay

## 2017-07-04 LAB — PAP, THIN PREP W/HPV RFLX HPV TYPE 16/18: HPV DNA High Risk: NOT DETECTED

## 2017-07-04 NOTE — Progress Notes (Signed)
I have reviewed this encounter including the documentation in this note and/or discussed this patient with the provider, Cassell Smiles, AGPCNP-BC. I am certifying that I agree with the content of this note as supervising physician.  Nobie Putnam, DO [facility] 07/04/2017, 5:08 PM

## 2017-07-26 ENCOUNTER — Ambulatory Visit: Payer: Self-pay

## 2017-07-29 ENCOUNTER — Telehealth: Payer: Self-pay

## 2017-07-29 NOTE — Telephone Encounter (Signed)
Rescheduled missed AWV. Patient confirmed new appt

## 2017-08-11 ENCOUNTER — Ambulatory Visit: Payer: Self-pay | Admitting: Family Medicine

## 2017-08-16 ENCOUNTER — Ambulatory Visit: Payer: Self-pay

## 2017-08-25 ENCOUNTER — Other Ambulatory Visit: Payer: Self-pay | Admitting: Nurse Practitioner

## 2017-08-25 ENCOUNTER — Other Ambulatory Visit: Payer: Self-pay | Admitting: Psychiatry

## 2017-08-25 DIAGNOSIS — E1149 Type 2 diabetes mellitus with other diabetic neurological complication: Secondary | ICD-10-CM

## 2017-09-02 ENCOUNTER — Telehealth: Payer: Self-pay | Admitting: Family Medicine

## 2017-09-02 ENCOUNTER — Encounter: Payer: Self-pay | Admitting: Family Medicine

## 2017-09-02 NOTE — Telephone Encounter (Signed)
Called pt to re-schedule for Annual Wellness Visit with Nurse Health Advisor, [place], my c/b # is [phone]  Jill Alexanders Pt has no showed multiple times for AWV. Will send letter

## 2017-09-06 ENCOUNTER — Encounter: Payer: Self-pay | Admitting: Family Medicine

## 2017-09-06 ENCOUNTER — Ambulatory Visit: Payer: Medicare Other

## 2017-09-06 ENCOUNTER — Ambulatory Visit (INDEPENDENT_AMBULATORY_CARE_PROVIDER_SITE_OTHER): Payer: Medicare Other | Admitting: Family Medicine

## 2017-09-06 VITALS — BP 96/69 | HR 80 | Temp 98.5°F | Resp 16 | Ht 65.0 in | Wt 183.0 lb

## 2017-09-06 DIAGNOSIS — K219 Gastro-esophageal reflux disease without esophagitis: Secondary | ICD-10-CM

## 2017-09-06 DIAGNOSIS — F419 Anxiety disorder, unspecified: Secondary | ICD-10-CM

## 2017-09-06 DIAGNOSIS — F339 Major depressive disorder, recurrent, unspecified: Secondary | ICD-10-CM

## 2017-09-06 DIAGNOSIS — E782 Mixed hyperlipidemia: Secondary | ICD-10-CM

## 2017-09-06 DIAGNOSIS — R110 Nausea: Secondary | ICD-10-CM

## 2017-09-06 DIAGNOSIS — M79601 Pain in right arm: Secondary | ICD-10-CM | POA: Diagnosis not present

## 2017-09-06 DIAGNOSIS — E1142 Type 2 diabetes mellitus with diabetic polyneuropathy: Secondary | ICD-10-CM | POA: Diagnosis not present

## 2017-09-06 DIAGNOSIS — Z23 Encounter for immunization: Secondary | ICD-10-CM | POA: Diagnosis not present

## 2017-09-06 DIAGNOSIS — R569 Unspecified convulsions: Secondary | ICD-10-CM

## 2017-09-06 DIAGNOSIS — M79602 Pain in left arm: Secondary | ICD-10-CM

## 2017-09-06 MED ORDER — OMEPRAZOLE 20 MG PO CPDR: 20.0000 mg | DELAYED_RELEASE_CAPSULE | Freq: Every day | ORAL | 5 refills | Status: DC

## 2017-09-06 MED ORDER — ATORVASTATIN CALCIUM 20 MG PO TABS: 20.0000 mg | ORAL_TABLET | Freq: Every day | ORAL | 3 refills | Status: AC

## 2017-09-06 MED ORDER — METFORMIN HCL 500 MG PO TABS: 500.0000 mg | ORAL_TABLET | Freq: Two times a day (BID) | ORAL | 3 refills | Status: DC

## 2017-09-06 MED ORDER — LISINOPRIL 2.5 MG PO TABS: 2.5000 mg | ORAL_TABLET | Freq: Every day | ORAL | 3 refills | Status: DC

## 2017-09-06 MED ORDER — ESCITALOPRAM OXALATE 20 MG PO TABS: 20.0000 mg | ORAL_TABLET | Freq: Every day | ORAL | 11 refills | Status: DC

## 2017-09-06 MED ORDER — GABAPENTIN 400 MG PO CAPS: 400.0000 mg | ORAL_CAPSULE | Freq: Two times a day (BID) | ORAL | 3 refills | Status: DC

## 2017-09-06 MED ORDER — ONDANSETRON 4 MG PO TBDP: 4.0000 mg | ORAL_TABLET | Freq: Two times a day (BID) | ORAL | 1 refills | Status: AC

## 2017-09-06 MED ORDER — GABAPENTIN 400 MG PO CAPS: 400.0000 mg | ORAL_CAPSULE | Freq: Two times a day (BID) | ORAL | 3 refills | Status: AC

## 2017-09-06 MED ORDER — METFORMIN HCL 500 MG PO TABS: 500.0000 mg | ORAL_TABLET | Freq: Two times a day (BID) | ORAL | 3 refills | Status: AC

## 2017-09-06 MED ORDER — ONDANSETRON 4 MG PO TBDP: 4.0000 mg | ORAL_TABLET | Freq: Two times a day (BID) | ORAL | 1 refills | Status: DC

## 2017-09-06 MED ORDER — OMEPRAZOLE 20 MG PO CPDR: 20.0000 mg | DELAYED_RELEASE_CAPSULE | Freq: Every day | ORAL | 5 refills | Status: AC

## 2017-09-06 MED ORDER — ESCITALOPRAM OXALATE 20 MG PO TABS: 20.0000 mg | ORAL_TABLET | Freq: Every day | ORAL | 11 refills | Status: AC

## 2017-09-06 NOTE — Assessment & Plan Note (Signed)
Controlled cholesterol on statin and lifestyle Last lipid panel 05/2016 Calculated ASCVD 10 yr risk score elevated risk  Plan: 1. Continue current meds - Atorvastatin 20mg  daily refilled 2. Future consider add ASA in future 3. Encourage improved lifestyle - low carb/cholesterol, reduce portion size, continue improving regular exercise 4. Follow-up 3 months - will need fasting lipids

## 2017-09-06 NOTE — Assessment & Plan Note (Signed)
uncertain etiology with palpable swollen area symmetrical inner aspect of both elbows flex surface, slight palpable fullness without discrete nodule. Consider possible over use muscle strain if bilateral, less likely to be lymph node at this location. No other skin problems in this area. - Reassurance - Trial of topical ice packs or heat as needed - Muscle rub - Follow-up if worsening or change in symptoms, if still persistent will offer ultrasound upper extremity bilateral

## 2017-09-06 NOTE — Assessment & Plan Note (Signed)
Improved, continue Lexapro 20mg  daily

## 2017-09-06 NOTE — Assessment & Plan Note (Signed)
Improved on Lexapro 20 Refilled today

## 2017-09-06 NOTE — Assessment & Plan Note (Signed)
Stable, followed by Dr Otilio Carpen Neuro Continue Depakote

## 2017-09-06 NOTE — Progress Notes (Signed)
Subjective:    Patient ID: Kaylee Wolf, female    DOB: 07-May-1951, 66 y.o.   MRN: [id number]  Kaylee Wolf is a 66 y.o. female presenting on 09/06/2017 for Diabetes  Accompanied by her daughter, Ngozi Alvidrez who provides majority of history.  HPI   Here for medication refills.  HYPERLIPIDEMIA: - Reports no concerns. Last lipid panel 05/2016, controlled  - Currently taking Atorvastatin 20mg , tolerating well without side effects or myalgias  CHRONIC DM, Type 2, w/ neuropathy Reports no concerns. A1c had been stable down to 6.2 last. Meds: Metformin 500mg  BID, request refill Reports good compliance. Tolerating well w/o side-effects Currently on ACEi low dose Admits neuropathy Denies hypoglycemia, polyuria, visual changes  Bilateral Sore / Knot on Inner Elbows - reports new additional complaint, uncertain duration, but has had sore spot on inner aspect of both elbows, slight localized swelling, uncertain if overuse injury or swollen spot, states she has a history of blood clot long in the past  Anxiety - Reports no new concerns, due for refill on Lexapro  History of Seizures - Followed by [facility] Neurology Dr Melrose Nakayama, on Depakote  Additional concern - Requests Medicaid Transportation arrangements for apts locally  Health Maintenance: - Due Flu Shot today  Social History  Substance Use Topics  . Smoking status: Former Smoker    Types: Cigarettes  . Smokeless tobacco: Former Systems developer  . Alcohol use No    Review of Systems Per HPI unless specifically indicated above     Objective:    BP 96/69   Pulse 80   Temp 98.5 F (36.9 C) (Oral)   Resp 16   Ht 5\' 5"  (1.651 m)   Wt 183 lb (83 kg)   LMP 05/10/2012   BMI 30.45 kg/m   Wt Readings from Last 3 Encounters:  09/06/17 183 lb (83 kg)  06/30/17 186 lb (84.4 kg)  05/10/17 189 lb (85.7 kg)    Physical Exam  Constitutional: She is oriented to person, place, and time. She appears well-developed and well-nourished.  No distress.  Well-appearing, comfortable, cooperative  HENT:  Head: Normocephalic and atraumatic.  Mouth/Throat: Oropharynx is clear and moist.  Eyes: Conjunctivae are normal. Right eye exhibits no discharge. Left eye exhibits no discharge.  Neck: Normal range of motion. Neck supple. No thyromegaly present.  Cardiovascular: Normal rate, regular rhythm, normal heart sounds and intact distal pulses.   No murmur heard. Pulmonary/Chest: Effort normal and breath sounds normal. No respiratory distress. She has no wheezes. She has no rales.  Musculoskeletal: Normal range of motion. She exhibits no edema.  Bilateral upper extremity, inner elbow flex surface medial aspect slight fullness and tender to light touch muscle bulk, no appreciable discrete nodule. No other skin changes, full ROM actively elbow flex/ext no joint effusion  Lymphadenopathy:    She has no cervical adenopathy.  Neurological: She is alert and oriented to person, place, and time.  Skin: Skin is warm and dry. No rash noted. She is not diaphoretic. No erythema.  Left great toenail unchanged, still with area of white on toenail appears raised, but not overly thick, no edema or erythema  Psychiatric: She has a normal mood and affect. Her behavior is normal.  Well groomed, good eye contact, normal speech and thoughts  Nursing note and vitals reviewed.  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 Continue current therapy - Metformin 500mg  BID - refilled 2. Encourage improved  lifestyle - low carb, low sugar diet, reduce portion size, continue improving regular exercise 3. Check CBG, bring log to next visit for review 4. Continue ARB, Statin 5. Advised to schedule DM ophtho exam, send record 6. Follow-up 3 months DM A1c      Relevant Medications   atorvastatin (LIPITOR) 20 MG tablet   gabapentin (NEURONTIN) 400 MG capsule   lisinopril (PRINIVIL,ZESTRIL) 2.5 MG tablet   metFORMIN (GLUCOPHAGE) 500 MG tablet   Other Relevant Orders   AMB Referral to [place] Management   Ambulatory referral to Connected Care   Seizures ([place])    Stable, followed by Dr Otilio Carpen Neuro Continue Depakote      Relevant Medications   gabapentin (NEURONTIN) 400 MG capsule   Recurrent major depressive disorder ([place])    Improved, continue Lexapro 20mg  daily      Relevant Medications   escitalopram (LEXAPRO) 20 MG tablet   Pain in both upper extremities    uncertain etiology with palpable swollen area symmetrical inner aspect of both elbows flex surface, slight palpable fullness without discrete nodule. Consider possible over use muscle strain if bilateral, less likely to be lymph node at this location. No other skin problems in this area. - Reassurance - Trial of topical ice packs or heat as needed - Muscle rub - Follow-up if worsening or change in symptoms, if still persistent will offer ultrasound upper extremity bilateral      HLD (hyperlipidemia)    Controlled cholesterol on statin and lifestyle Last lipid panel 05/2016 Calculated ASCVD 10 yr risk score elevated risk  Plan: 1. Continue current meds - Atorvastatin 20mg  daily refilled 2. Future consider add ASA in future 3. Encourage improved lifestyle - low carb/cholesterol, reduce portion size, continue improving regular exercise 4. Follow-up 3 months - will need fasting lipids      Relevant Medications   atorvastatin (LIPITOR) 20 MG tablet   lisinopril (PRINIVIL,ZESTRIL) 2.5 MG tablet   Anxiety    Improved on  Lexapro 20 Refilled today      Relevant Medications   escitalopram (LEXAPRO) 20 MG tablet    Other Visit Diagnoses    Needs flu shot       Relevant Orders   Flu Vaccine QUAD 36+ mos IM (Completed)   Diabetic peripheral neuropathy (HCC)   (Chronic)     Relevant Medications   atorvastatin (LIPITOR) 20 MG tablet   escitalopram (LEXAPRO) 20 MG tablet   gabapentin (NEURONTIN) 400 MG capsule   lisinopril (PRINIVIL,ZESTRIL) 2.5 MG tablet   metFORMIN (GLUCOPHAGE) 500 MG tablet   Other Relevant Orders   AMB Referral to [place] Management   Ambulatory referral to Connected Care   Gastroesophageal reflux disease without esophagitis       Relevant Medications   omeprazole (PRILOSEC) 20 MG capsule   ondansetron (ZOFRAN-ODT) 4 MG disintegrating tablet   Nausea       PRN zofran to help with nausea. Reviewed alarm symptoms. Return if not  improving.    Relevant Medications   ondansetron (ZOFRAN-ODT) 4 MG disintegrating tablet      Meds ordered this encounter  Medications  . atorvastatin (LIPITOR) 20 MG tablet    Sig: Take 1 tablet (20 mg total) by mouth daily.    Dispense:  90 tablet    Refill:  3  . DISCONTD: escitalopram (LEXAPRO) 20 MG tablet    Sig: Take 1 tablet (20 mg total) by mouth daily.    Dispense:  30 tablet    Refill:  11  . DISCONTD: gabapentin (NEURONTIN) 400 MG capsule    Sig: Take 1 capsule (400 mg total) by mouth 2 (two) times daily.    Dispense:  180 capsule    Refill:  3    Please consider 90 day supplies to promote better adherence  . DISCONTD: lisinopril (PRINIVIL,ZESTRIL) 2.5 MG tablet    Sig: Take 1 tablet (2.5 mg total) by mouth daily.    Dispense:  90 tablet    Refill:  3  . DISCONTD: metFORMIN (GLUCOPHAGE) 500 MG tablet    Sig: Take 1 tablet (500 mg total) by mouth 2 (two) times daily.    Dispense:  180 tablet    Refill:  3  . DISCONTD: omeprazole (PRILOSEC) 20 MG capsule    Sig: Take 1 capsule (20 mg total) by mouth daily. Before 30 min before first  meal of day    Dispense:  30 capsule    Refill:  5  . DISCONTD: ondansetron (ZOFRAN-ODT) 4 MG disintegrating tablet    Sig: Take 1 tablet (4 mg total) by mouth 2 (two) times daily.    Dispense:  20 tablet    Refill:  1  . escitalopram (LEXAPRO) 20 MG tablet    Sig: Take 1 tablet (20 mg total) by mouth daily.    Dispense:  30 tablet    Refill:  11  . gabapentin (NEURONTIN) 400 MG capsule    Sig: Take 1 capsule (400 mg total) by mouth 2 (two) times daily.    Dispense:  180 capsule    Refill:  3    Please consider 90 day supplies to promote better adherence  . lisinopril (PRINIVIL,ZESTRIL) 2.5 MG tablet    Sig: Take 1 tablet (2.5 mg total) by mouth daily.    Dispense:  90 tablet    Refill:  3  . metFORMIN (GLUCOPHAGE) 500 MG tablet    Sig: Take 1 tablet (500 mg total) by mouth 2 (two) times daily.    Dispense:  180 tablet    Refill:  3  . omeprazole (PRILOSEC) 20 MG capsule    Sig: Take 1 capsule (20 mg total) by mouth daily. Before 30 min before first meal of day    Dispense:  30 capsule    Refill:  5  . ondansetron (ZOFRAN-ODT) 4 MG disintegrating tablet    Sig: Take 1 tablet (4 mg total) by mouth 2 (two) times daily.    Dispense:  20 tablet    Refill:  1      Follow up plan: Return in about 3 months (around 12/06/2017) for DM A1c, HTN, HLD, Knots on Arms.  Kaylee Wolf, [facility] 09/06/2017, 11:40 PM

## 2017-09-06 NOTE — Patient Instructions (Addendum)
Thank you for coming to the clinic today.  1. Flu Shot today  2. Submitted referral to [facility] and [facility] - care management teams that can help with medicaid transportation in future  Please schedule a Follow-up Appointment to: Return in about 3 months (around 12/06/2017) for DM A1c, HTN, HLD, Knots on Arms.  If you have any other questions or concerns, please feel free to call the clinic or send a message through [place]. You may also schedule an earlier appointment if necessary.  Additionally, you may be receiving a survey about your experience at our clinic within a few days to 1 week by e-mail or mail. We value your feedback.  Nobie Putnam, DO [place]

## 2017-09-06 NOTE — Assessment & Plan Note (Signed)
Well-controlled DM with E3X Complications - peripheral neuropathy  Plan:  1. Continue current therapy - Metformin 500mg  BID - refilled 2. Encourage improved lifestyle - low carb, low sugar diet, reduce portion size, continue improving regular exercise 3. Check CBG, bring log to next visit for review 4. Continue ARB, Statin 5. Advised to schedule DM ophtho exam, send record 6. Follow-up 3 months DM A1c

## 2017-09-07 ENCOUNTER — Other Ambulatory Visit: Payer: Self-pay

## 2017-09-07 NOTE — Patient Outreach (Signed)
[place] [facility]) Care Management  09/07/2017  Kaylee Wolf 20-Sep-1951 [id number]   Telephone Screen  Referral Date: 09/07/17 Referral Source: MD office Referral Reason: "DM requesting assistance with Medicaid transportation services" Insurance: Medicare/Medicaid   Outreach attempt # 1 to patient. No answer. RN CM left HIPAA compliant voicemail message along with contact info.     Plan: RN CM will make outreach attempt to patient within one week if no return call.   [name], RN,BSN,CCM [place] Management Telephonic Care Management Coordinator Direct Phone: [phone] Toll Free: [phone] Fax: [phone]

## 2017-09-12 ENCOUNTER — Other Ambulatory Visit: Payer: Self-pay

## 2017-09-12 NOTE — Patient Outreach (Signed)
[name] [facility]) Care Management  09/12/2017  Kaylee Wolf 1951-03-12 [id number]    Telephone Screen  Referral Date: 09/07/17 Referral Source: MD office Referral Reason: "DM requesting assistance with Medicaid transportation services" Insurance: Medicare/Medicaid    Outreach attempt # 1 to patient. No answer. RN CM left HIPAA compliant voicemail message along with contact info.     Plan: RN CM will make outreach attempt to patient within a week if no return call.   [name], RN,BSN,CCM [place] Management Telephonic Care Management Coordinator Direct Phone: [phone] Toll Free: [phone] Fax: [phone]

## 2017-09-15 ENCOUNTER — Other Ambulatory Visit: Payer: Self-pay | Admitting: *Deleted

## 2017-09-15 NOTE — Patient Outreach (Signed)
[place]) Care Management  09/15/2017  KAILIN PRINCIPATO 22-Sep-1951 [id number]   Referral received from [place] to assist patient with transportation resources. Patient did not answer the phone, HIPPA compliant voicemail message left requesting a return call.   Sheralyn Boatman [facility] Care Management [phone]

## 2017-09-16 ENCOUNTER — Other Ambulatory Visit: Payer: Self-pay

## 2017-09-16 NOTE — Patient Outreach (Signed)
[place] [facility]) Care Management  09/16/2017  Kaylee Wolf [birth year] [id number]   Telephone Screen  Referral Date: 09/07/17 Referral Source: MD office Referral Reason: "DM, requesting assistance with Medicaid transportation services" Insurance: Medicare/Medicaid   Outreach attempt #3 to patient. No answer. RN CM left HIPAA compliant voicemail message along with contact info.    Plan: RN CM will send unsuccessful outreach letter to patient and close case if no response within 10 business days.    [name], RN,BSN,CCM [place] Management Telephonic Care Management Coordinator Direct Phone: [phone] Toll Free: [phone] Fax: [phone]

## 2017-09-30 ENCOUNTER — Other Ambulatory Visit: Payer: Self-pay

## 2017-09-30 NOTE — Patient Outreach (Signed)
[place]) Care Management  09/30/2017  Kaylee Wolf [birth year] [id number]  Case Closure Telephone Screen  Referral Date: 09/07/17 Referral Source: MD office Referral Reason: "DM requesting assistance with Medicaid transportation services" Insurance: Medicare/Medicaid   Multiple attempts to establish contact with patient without success. No response from letter mailed to patient. Case is being closed at this time.    Plan: RN CM will notify [facility] SW that case is being closed.    [name], RN,BSN,CCM [place] Management Telephonic Care Management Coordinator Direct Phone: [phone] Toll Free: [phone] Fax: [phone]

## 2018-01-19 ENCOUNTER — Encounter: Payer: Self-pay | Admitting: Family Medicine

## 2018-02-01 ENCOUNTER — Encounter: Payer: Self-pay | Admitting: Intensive Care

## 2018-02-01 ENCOUNTER — Emergency Department: Payer: Medicare Other

## 2018-02-01 ENCOUNTER — Inpatient Hospital Stay
Admission: EM | Admit: 2018-02-01 | Discharge: 2018-02-04 | DRG: 101 | Disposition: A | Discharge status: Home / Self Care | Payer: Medicare Other | Attending: Internal Medicine | Admitting: Internal Medicine

## 2018-02-01 ENCOUNTER — Other Ambulatory Visit: Payer: Self-pay | Admitting: Family Medicine

## 2018-02-01 DIAGNOSIS — R569 Unspecified convulsions: Secondary | ICD-10-CM

## 2018-02-01 DIAGNOSIS — Z1231 Encounter for screening mammogram for malignant neoplasm of breast: Secondary | ICD-10-CM

## 2018-02-01 DIAGNOSIS — Z9114 Patient's other noncompliance with medication regimen: Secondary | ICD-10-CM

## 2018-02-01 DIAGNOSIS — G40909 Epilepsy, unspecified, not intractable, without status epilepticus: Secondary | ICD-10-CM | POA: Diagnosis not present

## 2018-02-01 DIAGNOSIS — I10 Essential (primary) hypertension: Secondary | ICD-10-CM | POA: Diagnosis present

## 2018-02-01 DIAGNOSIS — G4700 Insomnia, unspecified: Secondary | ICD-10-CM | POA: Diagnosis present

## 2018-02-01 DIAGNOSIS — E782 Mixed hyperlipidemia: Secondary | ICD-10-CM | POA: Diagnosis present

## 2018-02-01 DIAGNOSIS — F419 Anxiety disorder, unspecified: Secondary | ICD-10-CM | POA: Diagnosis present

## 2018-02-01 DIAGNOSIS — B353 Tinea pedis: Secondary | ICD-10-CM | POA: Diagnosis present

## 2018-02-01 DIAGNOSIS — H6690 Otitis media, unspecified, unspecified ear: Secondary | ICD-10-CM | POA: Diagnosis present

## 2018-02-01 DIAGNOSIS — R55 Syncope and collapse: Secondary | ICD-10-CM | POA: Diagnosis present

## 2018-02-01 DIAGNOSIS — R413 Other amnesia: Secondary | ICD-10-CM | POA: Diagnosis present

## 2018-02-01 DIAGNOSIS — G2581 Restless legs syndrome: Secondary | ICD-10-CM | POA: Diagnosis present

## 2018-02-01 DIAGNOSIS — Z7984 Long term (current) use of oral hypoglycemic drugs: Secondary | ICD-10-CM

## 2018-02-01 DIAGNOSIS — Z87891 Personal history of nicotine dependence: Secondary | ICD-10-CM

## 2018-02-01 DIAGNOSIS — E119 Type 2 diabetes mellitus without complications: Secondary | ICD-10-CM | POA: Diagnosis present

## 2018-02-01 DIAGNOSIS — Z833 Family history of diabetes mellitus: Secondary | ICD-10-CM

## 2018-02-01 DIAGNOSIS — R4182 Altered mental status, unspecified: Secondary | ICD-10-CM

## 2018-02-01 DIAGNOSIS — Z79899 Other long term (current) drug therapy: Secondary | ICD-10-CM

## 2018-02-01 LAB — COMPREHENSIVE METABOLIC PANEL
ALBUMIN: 4.2 g/dL (ref 3.5–5.0)
ALT: 15 U/L (ref 14–54)
ANION GAP: 10 (ref 5–15)
AST: 19 U/L (ref 15–41)
Alkaline Phosphatase: 115 U/L (ref 38–126)
BILIRUBIN TOTAL: 0.3 mg/dL (ref 0.3–1.2)
BUN: 16 mg/dL (ref 6–20)
CO2: 28 mmol/L (ref 22–32)
Calcium: 9.6 mg/dL (ref 8.9–10.3)
Chloride: 103 mmol/L (ref 101–111)
Creatinine, Ser: 0.84 mg/dL (ref 0.44–1.00)
GFR calc Af Amer: >60 mL/min (ref >60)
GFR calc non Af Amer: >60 mL/min (ref >60)
GLUCOSE: 131 mg/dL — AB (ref 65–99)
POTASSIUM: 4.3 mmol/L (ref 3.5–5.1)
SODIUM: 141 mmol/L (ref 135–145)
TOTAL PROTEIN: 7.2 g/dL (ref 6.5–8.1)

## 2018-02-01 LAB — PHENYTOIN LEVEL, TOTAL: Phenytoin Lvl: 4.4 ug/mL — ABNORMAL LOW (ref 10.0–20.0)

## 2018-02-01 LAB — CBC
HEMATOCRIT: 43.3 % (ref 35.0–47.0)
HEMOGLOBIN: 14.4 g/dL (ref 12.0–16.0)
MCH: 30.4 pg (ref 26.0–34.0)
MCHC: 33.2 g/dL (ref 32.0–36.0)
MCV: 91.6 fL (ref 80.0–100.0)
Platelets: 210 10*3/uL (ref 150–440)
RBC: 4.73 MIL/uL (ref 3.80–5.20)
RDW: 14.2 % (ref 11.5–14.5)
WBC: 5.9 10*3/uL (ref 3.6–11.0)

## 2018-02-01 LAB — URINALYSIS, COMPLETE (UACMP) WITH MICROSCOPIC
BILIRUBIN URINE: NEGATIVE
Bacteria, UA: NONE SEEN
Glucose, UA: NEGATIVE mg/dL
HGB URINE DIPSTICK: NEGATIVE
KETONES UR: NEGATIVE mg/dL
Nitrite: NEGATIVE
PROTEIN: NEGATIVE mg/dL
Specific Gravity, Urine: 1.01 (ref 1.005–1.030)
pH: 7 (ref 5.0–8.0)

## 2018-02-01 LAB — GLUCOSE, CAPILLARY: GLUCOSE-CAPILLARY: 122 mg/dL — AB (ref 65–99)

## 2018-02-01 LAB — AMMONIA: Ammonia: 11 umol/L (ref 9–35)

## 2018-02-01 LAB — VALPROIC ACID LEVEL: Valproic Acid Lvl: 29 ug/mL — ABNORMAL LOW (ref 50.0–100.0)

## 2018-02-01 MED ORDER — PHENYTOIN SODIUM EXTENDED 100 MG PO CAPS
200.0000 mg | ORAL_CAPSULE | Freq: Once | ORAL | Status: AC
  Administered 2018-02-01: 200 mg via ORAL
  Filled 2018-02-01: qty 2

## 2018-02-01 MED ORDER — SODIUM CHLORIDE 0.9 % IV SOLN
1500.0000 mg | Freq: Once | INTRAVENOUS | Status: AC
  Administered 2018-02-02: 1500 mg via INTRAVENOUS
  Filled 2018-02-01: qty 30

## 2018-02-01 MED ORDER — LAMOTRIGINE 25 MG PO TABS
150.0000 mg | ORAL_TABLET | Freq: Once | ORAL | Status: AC
  Administered 2018-02-01: 150 mg via ORAL
  Filled 2018-02-01: qty 1

## 2018-02-01 MED ORDER — DIVALPROEX SODIUM 250 MG PO DR TAB
250.0000 mg | DELAYED_RELEASE_TABLET | Freq: Once | ORAL | Status: AC
  Administered 2018-02-01: 250 mg via ORAL
  Filled 2018-02-01: qty 1

## 2018-02-01 MED ORDER — SODIUM CHLORIDE 0.9 % IV SOLN: 1500.0000 mg | Freq: Once | INTRAVENOUS | Status: DC

## 2018-02-01 NOTE — ED Triage Notes (Signed)
Patients daughter states "My mom is seizing. She has a HX of seizures and sees MD Brewing technologist. Normally takes meds for seizure and forgot dose this morning and was told by Melrose Nakayama to take nighttime med. I was told to bring her to ER after no change in mental status after taking seizure med." Daughter reports AMS. "I got home from work around 2:30pm and noticed she was not herself"

## 2018-02-01 NOTE — ED Notes (Signed)
Pt daughter states that she got off work at 2:30pm today and she said  that pt did not take  her morning dose of seizure medication because it was still in the med box. She called pt doctor and they told her to give it to her anyway. Daughter gave medication at 3:55pm. Doctor told her if pt is still not "working" to bring her to ER. Daughter states she was mentally altered and did not understand answers to simple questions or instructions.

## 2018-02-01 NOTE — ED Triage Notes (Signed)
FIRST NURSE NOTE-daughter reports seizure since 11 am that has not stopped.  Pt alert and will look at RN and answer questions denies pain. Daughter reports with her seizures is disoriented normally and does not have shaking. Disoriented at this time.  NAD

## 2018-02-02 ENCOUNTER — Emergency Department: Payer: Medicare Other

## 2018-02-02 ENCOUNTER — Other Ambulatory Visit: Payer: Self-pay

## 2018-02-02 DIAGNOSIS — R569 Unspecified convulsions: Secondary | ICD-10-CM

## 2018-02-02 DIAGNOSIS — G40909 Epilepsy, unspecified, not intractable, without status epilepticus: Secondary | ICD-10-CM

## 2018-02-02 LAB — HEMOGLOBIN A1C
Hgb A1c MFr Bld: 6.1 % — ABNORMAL HIGH (ref 4.8–5.6)
Mean Plasma Glucose: 128.37 mg/dL

## 2018-02-02 LAB — INFLUENZA PANEL BY PCR (TYPE A & B)
INFLBPCR: NEGATIVE
Influenza A By PCR: NEGATIVE

## 2018-02-02 LAB — PHENYTOIN LEVEL, TOTAL: PHENYTOIN LVL: 17.2 ug/mL (ref 10.0–20.0)

## 2018-02-02 LAB — TSH: TSH: 3.027 u[IU]/mL (ref 0.350–4.500)

## 2018-02-02 LAB — GLUCOSE, CAPILLARY
Glucose-Capillary: 133 mg/dL — ABNORMAL HIGH (ref 65–99)
Glucose-Capillary: 141 mg/dL — ABNORMAL HIGH (ref 65–99)
Glucose-Capillary: 139 mg/dL — ABNORMAL HIGH (ref 65–99)
Glucose-Capillary: 119 mg/dL — ABNORMAL HIGH (ref 65–99)

## 2018-02-02 MED ORDER — ONDANSETRON HCL 4 MG PO TABS: 4.0000 mg | ORAL_TABLET | Freq: Four times a day (QID) | ORAL | Status: DC | PRN

## 2018-02-02 MED ORDER — DEXTROSE 5 % IV SOLN
500.0000 mg | Freq: Once | INTRAVENOUS | Status: AC
  Administered 2018-02-02: 500 mg via INTRAVENOUS
  Filled 2018-02-02: qty 5

## 2018-02-02 MED ORDER — GABAPENTIN 400 MG PO CAPS
400.0000 mg | ORAL_CAPSULE | Freq: Two times a day (BID) | ORAL | Status: DC
  Administered 2018-02-02 – 2018-02-04 (×6): 400 mg via ORAL
  Filled 2018-02-02: qty 4
  Filled 2018-02-02 (×4): qty 1
  Filled 2018-02-02: qty 4

## 2018-02-02 MED ORDER — ESCITALOPRAM OXALATE 10 MG PO TABS
20.0000 mg | ORAL_TABLET | Freq: Every day | ORAL | Status: DC
  Administered 2018-02-02 – 2018-02-04 (×3): 20 mg via ORAL
  Filled 2018-02-02 (×3): qty 2

## 2018-02-02 MED ORDER — INSULIN ASPART 100 UNIT/ML ~~LOC~~ SOLN
0.0000 [IU] | Freq: Three times a day (TID) | SUBCUTANEOUS | Status: DC
  Administered 2018-02-02 (×3): 1 [IU] via SUBCUTANEOUS
  Administered 2018-02-03: 2 [IU] via SUBCUTANEOUS
  Administered 2018-02-04 (×2): 1 [IU] via SUBCUTANEOUS
  Filled 2018-02-02 (×5): qty 1

## 2018-02-02 MED ORDER — LAMOTRIGINE 25 MG PO TABS
150.0000 mg | ORAL_TABLET | Freq: Two times a day (BID) | ORAL | Status: DC
  Administered 2018-02-02 – 2018-02-04 (×6): 150 mg via ORAL
  Filled 2018-02-02 (×6): qty 1

## 2018-02-02 MED ORDER — PANTOPRAZOLE SODIUM 40 MG PO TBEC
40.0000 mg | DELAYED_RELEASE_TABLET | Freq: Every day | ORAL | Status: DC
  Administered 2018-02-02 – 2018-02-04 (×3): 40 mg via ORAL
  Filled 2018-02-02 (×3): qty 1

## 2018-02-02 MED ORDER — ACETAMINOPHEN 325 MG PO TABS
650.0000 mg | ORAL_TABLET | Freq: Four times a day (QID) | ORAL | Status: DC | PRN
  Administered 2018-02-02: 650 mg via ORAL
  Filled 2018-02-02: qty 2

## 2018-02-02 MED ORDER — CLOTRIMAZOLE 1 % EX CREA
1.0000 "application " | TOPICAL_CREAM | Freq: Two times a day (BID) | CUTANEOUS | Status: DC
  Administered 2018-02-02 – 2018-02-04 (×4): 1 via TOPICAL
  Filled 2018-02-02 (×2): qty 15

## 2018-02-02 MED ORDER — VITAMIN D 1000 UNITS PO TABS
1000.0000 [IU] | ORAL_TABLET | Freq: Every day | ORAL | Status: DC
  Administered 2018-02-02 – 2018-02-04 (×3): 1000 [IU] via ORAL
  Filled 2018-02-02 (×3): qty 1

## 2018-02-02 MED ORDER — ENOXAPARIN SODIUM 40 MG/0.4ML ~~LOC~~ SOLN
40.0000 mg | SUBCUTANEOUS | Status: DC
  Administered 2018-02-02 – 2018-02-03 (×2): 40 mg via SUBCUTANEOUS
  Filled 2018-02-02 (×2): qty 0.4

## 2018-02-02 MED ORDER — ONDANSETRON HCL 4 MG/2ML IJ SOLN
INTRAMUSCULAR | Status: AC
  Administered 2018-02-02: 4 mg via INTRAVENOUS
  Filled 2018-02-02: qty 2

## 2018-02-02 MED ORDER — ATORVASTATIN CALCIUM 20 MG PO TABS
20.0000 mg | ORAL_TABLET | Freq: Every day | ORAL | Status: DC
  Administered 2018-02-02 – 2018-02-04 (×3): 20 mg via ORAL
  Filled 2018-02-02 (×3): qty 1

## 2018-02-02 MED ORDER — INSULIN ASPART 100 UNIT/ML ~~LOC~~ SOLN: 0.0000 [IU] | Freq: Every day | SUBCUTANEOUS | Status: DC

## 2018-02-02 MED ORDER — DIVALPROEX SODIUM 125 MG PO CSDR
125.0000 mg | DELAYED_RELEASE_CAPSULE | Freq: Every day | ORAL | Status: DC
  Administered 2018-02-02 – 2018-02-04 (×3): 125 mg via ORAL
  Filled 2018-02-02 (×3): qty 1

## 2018-02-02 MED ORDER — LISINOPRIL 5 MG PO TABS
2.5000 mg | ORAL_TABLET | Freq: Every day | ORAL | Status: DC
  Administered 2018-02-02: 10:00:00 2.5 mg via ORAL
  Filled 2018-02-02 (×2): qty 1

## 2018-02-02 MED ORDER — ONDANSETRON HCL 4 MG/2ML IJ SOLN
4.0000 mg | Freq: Four times a day (QID) | INTRAMUSCULAR | Status: DC | PRN
  Administered 2018-02-02: 16:00:00 4 mg via INTRAVENOUS
  Filled 2018-02-02: qty 2

## 2018-02-02 MED ORDER — PHENYTOIN SODIUM EXTENDED 100 MG PO CAPS
200.0000 mg | ORAL_CAPSULE | Freq: Two times a day (BID) | ORAL | Status: DC
  Administered 2018-02-02 – 2018-02-04 (×5): 200 mg via ORAL
  Filled 2018-02-02 (×9): qty 2

## 2018-02-02 MED ORDER — DIVALPROEX SODIUM 250 MG PO DR TAB
250.0000 mg | DELAYED_RELEASE_TABLET | Freq: Every evening | ORAL | Status: DC
  Administered 2018-02-02 – 2018-02-03 (×2): 250 mg via ORAL
  Filled 2018-02-02 (×5): qty 1

## 2018-02-02 MED ORDER — ONDANSETRON HCL 4 MG/2ML IJ SOLN
4.0000 mg | Freq: Once | INTRAMUSCULAR | Status: AC
  Administered 2018-02-02: 4 mg via INTRAVENOUS

## 2018-02-02 MED ORDER — DOCUSATE SODIUM 100 MG PO CAPS
100.0000 mg | ORAL_CAPSULE | Freq: Two times a day (BID) | ORAL | Status: DC
  Administered 2018-02-02 – 2018-02-04 (×6): 100 mg via ORAL
  Filled 2018-02-02 (×6): qty 1

## 2018-02-02 MED ORDER — QUETIAPINE FUMARATE 25 MG PO TABS
25.0000 mg | ORAL_TABLET | Freq: Two times a day (BID) | ORAL | Status: DC
  Administered 2018-02-02 – 2018-02-04 (×4): 25 mg via ORAL
  Filled 2018-02-02 (×4): qty 1

## 2018-02-02 MED ORDER — ACETAMINOPHEN 650 MG RE SUPP
650.0000 mg | Freq: Four times a day (QID) | RECTAL | Status: DC | PRN
Start: 2018-02-02 — End: 2018-02-04

## 2018-02-02 NOTE — ED Provider Notes (Signed)
[facility] Emergency Department Provider Note  ____________________________________________  Time seen: Approximately 12:00 AM  I have reviewed the triage vital signs and the nursing notes.   HISTORY  Chief Complaint Altered Mental Status  Level 5 caveat:  Portions of the history and physical were unable to be obtained due to post-ictal, confusion   HPI Kaylee Wolf is a 67 y.o. female history of seizure disorder who presents for evaluation of seizure and altered mental status. According to patient's daughter she has had epilepsy since she was a child. She is followed by Dr. Melrose Nakayama. The daughter describes the patient has seizures every time she forgets to take her medications. She saw the patient today at 5 AM and she was in her usual state of health. When daughter came back from work at 2:30PM patient was confused and she noticed that she had not taken her medications today. The daughter describes the seizures as patient acting confused, she is unable to understand simple commands, keeps repeating herself. She denies ever witnessing a generalized tonic-clonic seizure. There is no urinary or bowel loss. This is how her seizures have been for her entire life. Patient has EEG that confirms history of epilepsy. She is on Depakote, Lamictal, and phenytoin. Daughter reports compliance as she checks her pill pack every day. Daughter gave her warning medications at [facility] as instructed by patient's neurologist. Since her mental status did not clear she was instructed by the neurologist to bring her to the emergency room for evaluation. Patient denies headache, chest pain, abdominal pain, SOB, dysuria, URI symptoms, vomiting, or diarrhea. No neurological deficits seen by daughter. No prior h/o stroke.   Past Medical History:  Diagnosis Date  . Anxiety   . Insomnia   . Mixed hyperlipidemia   . Recurrent major depressive disorder ([place])   . RLS (restless legs syndrome)     . Seizures ([place])   . Tinea pedis of both feet   . Type 2 diabetes mellitus ([place])   . Varicose veins     Patient Active Problem List   Diagnosis Date Noted  . Pain in both upper extremities 09/06/2017  . Anxiety 07/03/2017  . Onychomycosis of left great toe 02/07/2017  . Allergic rhinitis due to allergen 02/07/2017  . Arthralgia of right temporomandibular joint 02/07/2017  . 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 Recurrent major depressive disorder ([place]) 10/24/2015  . Varicose veins 10/24/2015  . Seizures ([place]) 10/24/2015  . Tinea pedis of both feet 10/24/2015  . Insomnia 10/24/2015  . RLS (restless legs syndrome) 10/24/2015  . Avitaminosis D 11/13/2012  . HLD (hyperlipidemia) 08/14/2012    Past Surgical History:  Procedure Laterality Date  . none      Prior to Admission medications   Medication Sig Start Date End Date Taking? Authorizing Provider  atorvastatin (LIPITOR) 20 MG tablet Take 1 tablet (20 mg total) by mouth daily. 09/06/17  Yes Karamalegos, Devonne Doughty, DO  Cholecalciferol (VITAMIN D-3) 1000 UNITS CAPS Take 1 capsule by mouth daily.    Yes [provider]  divalproex (DEPAKOTE) 125 MG DR tablet Take 1 pill in am and 2 pill at night 02/21/17  Yes Rainey Pines, MD  escitalopram (LEXAPRO) 20 MG tablet Take 1 tablet (20 mg total) by mouth daily. 09/06/17  Yes Karamalegos, Devonne Doughty, DO  gabapentin (NEURONTIN) 400 MG capsule Take 1 capsule (400 mg total) by mouth  2 (two) times daily. 09/06/17  Yes Karamalegos, Devonne Doughty, DO  lamoTRIgine (LAMICTAL) 150 MG tablet Take 150 mg by mouth 2 (two) times daily.  07/20/16  Yes [provider]  lisinopril (PRINIVIL,ZESTRIL) 2.5 MG tablet Take 1 tablet (2.5 mg total) by mouth daily. 09/06/17  Yes Karamalegos, Devonne Doughty, DO  meloxicam (MOBIC) 15 MG tablet Take 15 mg by mouth as needed. Reported on 06/07/2016   Yes  [provider]  metFORMIN (GLUCOPHAGE) 500 MG tablet Take 1 tablet (500 mg total) by mouth 2 (two) times daily. 09/06/17  Yes Karamalegos, Devonne Doughty, DO  omeprazole (PRILOSEC) 20 MG capsule Take 1 capsule (20 mg total) by mouth daily. Before 30 min before first meal of day 09/06/17  Yes Karamalegos, Alexander J, DO  ondansetron (ZOFRAN-ODT) 4 MG disintegrating tablet Take 1 tablet (4 mg total) by mouth 2 (two) times daily. Patient taking differently: Take 4 mg by mouth 2 (two) times daily as needed.  09/06/17  Yes Karamalegos, Devonne Doughty, DO  phenytoin (DILANTIN) 200 MG ER capsule Take 200 mg by mouth 2 (two) times daily.  11/17/15  Yes [provider]  QUEtiapine (SEROQUEL) 25 MG tablet Take 1 tablet (25 mg total) by mouth 2 (two) times daily after a meal. 02/21/17  Yes Rainey Pines, MD  blood glucose meter kit and supplies KIT Dispense based on patient and insurance preference. Check blood glucose once daily. 11/25/15   Luciana Axe, NP  clotrimazole (LOTRIMIN) 1 % cream Apply 1 application topically 2 (two) times daily. Reported on 06/07/2016    [provider]    Allergies Patient has no known allergies.  Family History  Problem Relation Age of Onset  . Diabetes Mother   . Heart disease Mother   . Cancer Father     Social History Social History   Tobacco Use  . Smoking status: Former Smoker    Types: Cigarettes  . Smokeless tobacco: Former Network engineer Use Topics  . Alcohol use: No    Alcohol/week: 0.0 oz  . Drug use: No    Review of Systems  Constitutional: Negative for fever. + confusion Eyes: Negative for visual changes. ENT: Negative for sore throat. Neck: No neck pain  Cardiovascular: Negative for chest pain. Respiratory: Negative for shortness of breath. Gastrointestinal: Negative for abdominal pain, vomiting or diarrhea. Genitourinary: Negative for dysuria. Musculoskeletal: Negative for back pain. Skin: Negative for  rash. Neurological: Negative for headaches, weakness or numbness. Psych: No SI or HI  ____________________________________________   PHYSICAL EXAM:  VITAL SIGNS: ED Triage Vitals [02/01/18 1741]  Enc Vitals Group     BP 140/75     Pulse Rate 92     Resp 18     Temp 98.2 F (36.8 C)     Temp Source Oral     SpO2 95 %     Weight 180 lb (81.6 kg)     Height 5' (1.524 m)     Head Circumference      Peak Flow      Pain Score      Pain Loc      Pain Edu?      Excl. in [place]?     Constitutional: Alert and oriented x 3. Well appearing and in no apparent distress. HEENT:      Head: Normocephalic and atraumatic.         Eyes: Conjunctivae are normal. Sclera is non-icteric.       Mouth/Throat: Mucous membranes are  moist.       Neck: Supple with no signs of meningismus. Cardiovascular: Regular rate and rhythm. No murmurs, gallops, or rubs. 2+ symmetrical distal pulses are present in all extremities. No JVD. Respiratory: Normal respiratory effort. Lungs are clear to auscultation bilaterally. No wheezes, crackles, or rhonchi.  Gastrointestinal: Soft, non tender, and non distended with positive bowel sounds. No rebound or guarding. Musculoskeletal: Nontender with normal range of motion in all extremities. No edema, cyanosis, or erythema of extremities. Neurologic: Patient is slightly confused, normal speech and language, intact strength and sensation, face is symmetric, intact extraocular movements, pupils equal round and reactive, no pronator drift, no dysmetria  Skin: Skin is warm, dry and intact. No rash noted. Psychiatric: Mood and affect are normal. Speech and behavior are normal.  ____________________________________________   LABS (all labs ordered are listed, but only abnormal results are displayed)  Labs Reviewed  COMPREHENSIVE METABOLIC PANEL - Abnormal; Notable for the following components:      Result Value   Glucose, Bld 131 (*)    All other components within normal  limits  URINALYSIS, COMPLETE (UACMP) WITH MICROSCOPIC - Abnormal; Notable for the following components:   Color, Urine YELLOW (*)    APPearance CLEAR (*)    Leukocytes, UA MODERATE (*)    Squamous Epithelial / LPF 0-5 (*)    All other components within normal limits  GLUCOSE, CAPILLARY - Abnormal; Notable for the following components:   Glucose-Capillary 122 (*)    All other components within normal limits  PHENYTOIN LEVEL, TOTAL - Abnormal; Notable for the following components:   Phenytoin Lvl 4.4 (*)    All other components within normal limits  VALPROIC ACID LEVEL - Abnormal; Notable for the following components:   Valproic Acid Lvl 29 (*)    All other components within normal limits  CBC  AMMONIA  LAMOTRIGINE LEVEL  CBG MONITORING, ED   ____________________________________________  EKG  none ____________________________________________  RADIOLOGY  Interpreted by me: CT head: negative  MRI brain: PND   Interpretation by Radiologist:  Ct Head Wo Contrast  Result Date: 02/01/2018 CLINICAL DATA:  Acute onset of altered mental status. EXAM: CT HEAD WITHOUT CONTRAST TECHNIQUE: Contiguous axial images were obtained from the base of the skull through the vertex without intravenous contrast. COMPARISON:  CT of the head performed 08/22/2016 FINDINGS: Brain: No evidence of acute infarction, hemorrhage, hydrocephalus, extra-axial collection or mass lesion / mass effect. Prominence of the sulci reflects mild cortical volume loss. Mild periventricular white matter change likely reflects small vessel ischemic microangiopathy. The brainstem and fourth ventricle are within normal limits. The basal ganglia are unremarkable in appearance. The cerebral hemispheres demonstrate grossly normal gray-white differentiation. No mass effect or midline shift is seen. Vascular: No hyperdense vessel or unexpected calcification. Skull: No free fluid is identified within the pelvis. The colon is unremarkable  in appearance; the appendix is normal in caliber, and contains air. Sinuses/Orbits: The visualized portions of the orbits are within normal limits. The paranasal sinuses and mastoid air cells are well-aerated. Other: No significant soft tissue abnormalities are seen. IMPRESSION: 1. No acute intracranial pathology seen on CT. 2. Mild cortical volume loss and scattered small vessel ischemic microangiopathy. Electronically Signed   By: Garald Balding M.D.   On: 02/01/2018 21:15     ____________________________________________   PROCEDURES  Procedure(s) performed: None Procedures Critical Care performed:  None ____________________________________________   INITIAL IMPRESSION / ASSESSMENT AND PLAN / ED COURSE   67 y.o. female history of  seizure disorder who presents for evaluation of seizure and altered mental status in the setting of medication non compliance. The description of the seizure sounds very strange to me as it seems like patient is postictal without having a generalized tonic-clonic seizure. According to the daughter this is how patient's seizures have been her entire life. I wanted to make sure patient didn't have a stroke as she does seem slightly confused and a little delayed in following commands. Her neurological exam is otherwise within normal limits. Her head CT is negative. MRI is pending. The levels of all of her antiepileptic medications are low therefore patient was loaded IV. No further episodes of seizure in the emergency department. Her labs are otherwise within normal limits with no evidence of infection, dehydration, or electrolyte abnormalities. MRI is pending and if that's negative patient will be discharged home with follow-up with her neurologist.      As part of my medical decision making, I reviewed the following data within the [place] History obtained from family, Nursing notes reviewed and incorporated, Labs reviewed , Patient signed out to  Dr. Owens Shark, Radiograph reviewed , Notes from prior ED visits and [place] Controlled Substance Database    Pertinent labs & imaging results that were available during my care of the patient were reviewed by me and considered in my medical decision making (see chart for details).    ____________________________________________   FINAL CLINICAL IMPRESSION(S) / ED DIAGNOSES  Final diagnoses:  Altered mental status, unspecified altered mental status type  Seizure ([place])      NEW MEDICATIONS STARTED DURING THIS VISIT:  ED Discharge Orders    None       Note:  This document was prepared using Dragon voice recognition software and may include unintentional dictation errors.    Alfred Levins, [place], MD 02/02/18 [phone]

## 2018-02-02 NOTE — Progress Notes (Signed)
Patient ID: Kaylee Wolf, female   DOB: 1951/09/28, 67 y.o.   MRN: [id number]   ACP note  Patient present at the bedside. Unable to reach daughter on phone at this point  Diagnosis seizure disorder.  Patient with seizures.  Unable to tell me anything about what happens with them.  She was confused afterwards.  Patient feeling drunk like she cannot even walk on her balance is off.  We will get physical therapy evaluation. Patient was given IV medications in the ER secondary to low levels.  The difficulty walking can be secondary to the rapid Dilantin infusion.  I tried to discuss CODE STATUS with her.  She stated she needed to think about that a little bit more.  Tried to reach daughter for further clarification.  Time spent on ACP discussion 17 minutes Dr. Loletha Grayer

## 2018-02-02 NOTE — Care Management Note (Signed)
Case Management Note  Patient Details  Name: Kaylee Wolf MRN: [id number] Date of Birth: 1951/03/09  Subjective/Objective:       Admitted to [facility] with the diagnosis of epilepsy under observation status. States a friend lives in her home. Daughter is [name]  [phone]) Sees Dr. Parks Ranger as primary care physician. Prescriptions are filled at [facility] on [address],  No Home Health. No skilled nursing. Takes care of all basic activities of daily living herself, doesn't drive. Falls in the past. Good appetite.             Action/Plan: Will continue to follow for discharge plans   Expected Discharge Date:                  Expected Discharge Plan:     In-House Referral:   yes  Discharge planning Services     Post Acute Care Choice:    Choice offered to:     DME Arranged:    DME Agency:     HH Arranged:    HH Agency:     Status of Service:     If discussed at [employer] of Stay Meetings, dates discussed:    Additional Comments:  Shelbie Ammons, RN MSN CCM Care Management [phone] 02/02/2018, 9:16 AM

## 2018-02-02 NOTE — Care Management Obs Status (Addendum)
[place] NOTIFICATION   Patient Details  Name: Kaylee Wolf MRN: [id number] Date of Birth: August 03, 1951   Medicare Observation Status Notification Given:  Yes. Permission from Ms. [place] to sign    Shelbie Ammons, RN 02/02/2018, 9:03 AM

## 2018-02-02 NOTE — Consult Note (Signed)
Reason for Consult:Seizures Referring Physician: Leslye Peer  CC: Seizures  HPI: Kaylee Wolf is an 67 y.o. female with a history of seizures who reportedly missed doses of her anticonvulsants and presented with a breakthrough seizures.  Patient is on Neurontin, Lamictal, Dilantin and Depakote at home.  Dilantin and Depakote level were subtherapeutic on presentation.  Seizures described by her friend as staring episodes.  Patient is amnestic of the events.  Past Medical History:  Diagnosis Date  . Anxiety   . Insomnia   . Mixed hyperlipidemia   . Recurrent major depressive disorder ([place])   . RLS (restless legs syndrome)   . Seizures ([place])   . Tinea pedis of both feet   . Type 2 diabetes mellitus ([place])   . Varicose veins     Past Surgical History:  Procedure Laterality Date  . none      Family History  Problem Relation Age of Onset  . Diabetes Mother   . Heart disease Mother   . Cancer Father     Social History:  reports that she has quit smoking. Her smoking use included cigarettes. She has quit using smokeless tobacco. She reports that she does not drink alcohol or use drugs.  No Known Allergies  Medications:  I have reviewed the patient's current medications. Prior to Admission:  Medications Prior to Admission  Medication Sig Dispense Refill Last Dose  . atorvastatin (LIPITOR) 20 MG tablet Take 1 tablet (20 mg total) by mouth daily. 90 tablet 3 01/31/2018 at Unknown time  . Cholecalciferol (VITAMIN D-3) 1000 UNITS CAPS Take 1 capsule by mouth daily.    01/31/2018 at Unknown time  . divalproex (DEPAKOTE) 125 MG DR tablet Take 1 pill in am and 2 pill at night 90 tablet 1 02/01/2018 at 2300  . escitalopram (LEXAPRO) 20 MG tablet Take 1 tablet (20 mg total) by mouth daily. 30 tablet 11 01/31/2018 at Unknown time  . gabapentin (NEURONTIN) 400 MG capsule Take 1 capsule (400 mg total) by mouth 2 (two) times daily. 180 capsule 3 01/31/2018 at Unknown time  . lamoTRIgine (LAMICTAL) 150  MG tablet Take 150 mg by mouth 2 (two) times daily.    01/31/2018 at Unknown time  . lisinopril (PRINIVIL,ZESTRIL) 2.5 MG tablet Take 1 tablet (2.5 mg total) by mouth daily. 90 tablet 3 01/31/2018 at Unknown time  . meloxicam (MOBIC) 15 MG tablet Take 15 mg by mouth as needed. Reported on 06/07/2016   prn at prn  . metFORMIN (GLUCOPHAGE) 500 MG tablet Take 1 tablet (500 mg total) by mouth 2 (two) times daily. 180 tablet 3 01/31/2018 at Unknown time  . omeprazole (PRILOSEC) 20 MG capsule Take 1 capsule (20 mg total) by mouth daily. Before 30 min before first meal of day 30 capsule 5 01/31/2018 at Unknown time  . ondansetron (ZOFRAN-ODT) 4 MG disintegrating tablet Take 1 tablet (4 mg total) by mouth 2 (two) times daily. (Patient taking differently: Take 4 mg by mouth 2 (two) times daily as needed. ) 20 tablet 1 prn at prn  . phenytoin (DILANTIN) 200 MG ER capsule Take 200 mg by mouth 2 (two) times daily.    01/31/2018 at Unknown time  . QUEtiapine (SEROQUEL) 25 MG tablet Take 1 tablet (25 mg total) by mouth 2 (two) times daily after a meal. 60 tablet 1 01/31/2018 at Unknown time  . blood glucose meter kit and supplies KIT Dispense based on patient and insurance preference. Check blood glucose once daily. 1 each 0  Taking  . clotrimazole (LOTRIMIN) 1 % cream Apply 1 application topically 2 (two) times daily. Reported on 06/07/2016   Completed Course at Unknown time   Scheduled: . atorvastatin  20 mg Oral Daily  . cholecalciferol  1,000 Units Oral Daily  . clotrimazole  1 application Topical BID  . divalproex  125 mg Oral Q breakfast  . divalproex  250 mg Oral QPM  . docusate sodium  100 mg Oral BID  . enoxaparin (LOVENOX) injection  40 mg Subcutaneous Q24H  . escitalopram  20 mg Oral Daily  . gabapentin  400 mg Oral BID  . insulin aspart  0-5 Units Subcutaneous QHS  . insulin aspart  0-9 Units Subcutaneous TID WC  . lamoTRIgine  150 mg Oral BID  . lisinopril  2.5 mg Oral Daily  . pantoprazole  40 mg Oral  Daily  . phenytoin  200 mg Oral BID  . QUEtiapine  25 mg Oral BID PC    ROS: History obtained from the patient  General ROS: negative for - chills, fatigue, fever, night sweats, weight gain or weight loss Psychological ROS: negative for - behavioral disorder, hallucinations, memory difficulties, mood swings or suicidal ideation Ophthalmic ROS: negative for - blurry vision, double vision, eye pain or loss of vision ENT ROS: negative for - epistaxis, nasal discharge, oral lesions, sore throat, tinnitus or vertigo Allergy and Immunology ROS: negative for - hives or itchy/watery eyes Hematological and Lymphatic ROS: negative for - bleeding problems, bruising or swollen lymph nodes Endocrine ROS: negative for - galactorrhea, hair pattern changes, polydipsia/polyuria or temperature intolerance Respiratory ROS: negative for - cough, hemoptysis, shortness of breath or wheezing Cardiovascular ROS: negative for - chest pain, dyspnea on exertion, edema or irregular heartbeat Gastrointestinal ROS: negative for - abdominal pain, diarrhea, hematemesis, nausea/vomiting or stool incontinence Genito-Urinary ROS: negative for - dysuria, hematuria, incontinence or urinary frequency/urgency Musculoskeletal ROS: negative for - joint swelling or muscular weakness Neurological ROS: as noted in HPI, gait ataxia Dermatological ROS: itching on her legs  Physical Examination: Blood pressure 110/67, pulse 85, temperature 98.2 F (36.8 C), resp. rate 18, height '5\' 5"'  (1.651 m), weight 82.9 kg (182 lb 12.2 oz), last menstrual period 05/10/2012, SpO2 96 %.  HEENT-  Normocephalic, no lesions, without obvious abnormality.  Normal external eye and conjunctiva.  Normal TM's bilaterally.  Normal auditory canals and external ears. Normal external nose, mucus membranes and septum.  Normal pharynx. Cardiovascular- S1, S2 normal, pulses palpable throughout   Lungs- chest clear, no wheezing, rales, normal symmetric air  entry Abdomen- soft, non-tender; bowel sounds normal; no masses,  no organomegaly Extremities- no edema Lymph-no adenopathy palpable Musculoskeletal-no joint tenderness, deformity or swelling Skin-warm and dry, no hyperpigmentation, vitiligo, or suspicious lesions  Neurological Examination   Mental Status: Alert, oriented.  Speech fluent without evidence of aphasia.  Able to follow commands Cranial Nerves: II: Discs flat bilaterally; Visual fields grossly normal, pupils equal, round, reactive to light and accommodation III,IV, VI: ptosis not present, extra-ocular motions intact bilaterally V,VII: smile symmetric, facial light touch sensation normal bilaterally VIII: hearing normal bilaterally IX,X: gag reflex present XI: bilateral shoulder shrug XII: midline tongue extension Motor: Right : Upper extremity   5/5    Left:     Upper extremity   5/5  Lower extremity   5/5     Lower extremity   5/5 Tone and bulk:normal tone throughout; no atrophy noted.  Right arm at times seems out of patient control Sensory: Pinprick and  light touch intact throughout, bilaterally Deep Tendon Reflexes: 2+ and symmetric with absent AJ's bilaterally Plantars: Right: downgoing   Left: downgoing Cerebellar: Normal finger-to-nose and normal heel-to-shin testing bilaterally Gait: not tested due to safety concerns    Laboratory Studies:   Basic Metabolic Panel: Recent Labs  Lab 02/01/18 1759  NA 141  K 4.3  CL 103  CO2 28  GLUCOSE 131*  BUN 16  CREATININE 0.84  CALCIUM 9.6    Liver Function Tests: Recent Labs  Lab 02/01/18 1759  AST 19  ALT 15  ALKPHOS 115  BILITOT 0.3  PROT 7.2  ALBUMIN 4.2   No results for input(s): LIPASE, AMYLASE in the last 168 hours. Recent Labs  Lab 02/01/18 1808  AMMONIA 11    CBC: Recent Labs  Lab 02/01/18 1759  WBC 5.9  HGB 14.4  HCT 43.3  MCV 91.6  PLT 210    Cardiac Enzymes: No results for input(s): CKTOTAL, CKMB, CKMBINDEX, TROPONINI in  the last 168 hours.  BNP: Invalid input(s): POCBNP  CBG: Recent Labs  Lab 02/01/18 1818 02/02/18 0744  GLUCAP 122* 133*    Microbiology: No results found for this or any previous visit.  Coagulation Studies: No results for input(s): LABPROT, INR in the last 72 hours.  Urinalysis:  Recent Labs  Lab 02/01/18 1808  COLORURINE YELLOW*  LABSPEC 1.010  PHURINE 7.0  GLUCOSEU NEGATIVE  HGBUR NEGATIVE  BILIRUBINUR NEGATIVE  KETONESUR NEGATIVE  PROTEINUR NEGATIVE  NITRITE NEGATIVE  LEUKOCYTESUR MODERATE*    Lipid Panel:     Component Value Date/Time   CHOL 204 (H) 06/07/2016 1111   TRIG 109 06/07/2016 1111   HDL 74 06/07/2016 1111   CHOLHDL 2.8 06/07/2016 1111   VLDL 22 06/07/2016 1111   LDLCALC 108 06/07/2016 1111    HgbA1C:  Lab Results  Component Value Date   HGBA1C 6.1 (H) 02/01/2018    Urine Drug Screen:  No results found for: LABOPIA, COCAINSCRNUR, LABBENZ, AMPHETMU, THCU, LABBARB  Alcohol Level: No results for input(s): ETH in the last 168 hours.   Imaging: Dg Chest 2 View  Result Date: 02/02/2018 CLINICAL DATA:  Acute onset of cough.  Status post seizure. EXAM: CHEST  2 VIEW COMPARISON:  Chest radiograph performed 08/22/2016 FINDINGS: The lungs are well-aerated. Mild left basilar opacity likely reflects atelectasis. There is no evidence of pleural effusion or pneumothorax. The heart is normal in size; the mediastinal contour is within normal limits. No acute osseous abnormalities are seen. IMPRESSION: Mild left basilar airspace opacity likely reflects atelectasis. Lungs otherwise clear. Electronically Signed   By: Garald Balding M.D.   On: 02/02/2018 02:58   Ct Head Wo Contrast  Result Date: 02/01/2018 CLINICAL DATA:  Acute onset of altered mental status. EXAM: CT HEAD WITHOUT CONTRAST TECHNIQUE: Contiguous axial images were obtained from the base of the skull through the vertex without intravenous contrast. COMPARISON:  CT of the head performed 08/22/2016  FINDINGS: Brain: No evidence of acute infarction, hemorrhage, hydrocephalus, extra-axial collection or mass lesion / mass effect. Prominence of the sulci reflects mild cortical volume loss. Mild periventricular white matter change likely reflects small vessel ischemic microangiopathy. The brainstem and fourth ventricle are within normal limits. The basal ganglia are unremarkable in appearance. The cerebral hemispheres demonstrate grossly normal gray-white differentiation. No mass effect or midline shift is seen. Vascular: No hyperdense vessel or unexpected calcification. Skull: No free fluid is identified within the pelvis. The colon is unremarkable in appearance; the appendix is normal in  caliber, and contains air. Sinuses/Orbits: The visualized portions of the orbits are within normal limits. The paranasal sinuses and mastoid air cells are well-aerated. Other: No significant soft tissue abnormalities are seen. IMPRESSION: 1. No acute intracranial pathology seen on CT. 2. Mild cortical volume loss and scattered small vessel ischemic microangiopathy. Electronically Signed   By: Garald Balding M.D.   On: 02/01/2018 21:15   Mr Brain Wo Contrast  Result Date: 02/02/2018 CLINICAL DATA:  Initial evaluation for acute altered mental status EXAM: MRI HEAD WITHOUT CONTRAST TECHNIQUE: Multiplanar, multiecho pulse sequences of the brain and surrounding structures were obtained without intravenous contrast. COMPARISON:  Prior CT from earlier the same day. FINDINGS: Brain: Diffuse prominence of the CSF containing spaces compatible with generalized age-related cerebral atrophy. Patchy and confluent T2/FLAIR hyperintensity within the periventricular deep white matter both cerebral hemispheres most consistent with chronic small vessel ischemic disease, mild in nature. Encephalomalacia at the inferior right cingulate gyrus consistent with remote ischemic infarct. Few scattered subcentimeter remote bilateral cerebellar infarcts  noted. No abnormal foci of restricted diffusion to suggest acute or subacute ischemia. Gray-white matter differentiation maintained. No other areas of chronic infarction. No evidence for acute or chronic intracranial hemorrhage. No mass lesion, midline shift or mass effect. No hydrocephalus. No extra-axial fluid collection. Major dural sinuses are grossly patent. Pituitary gland suprasellar region normal. Midline structures intact and normal. Vascular: Major intracranial vascular flow voids are maintained at the skull base. Skull and upper cervical spine: Craniocervical junction normal. Bone marrow signal intensity within normal limits. No scalp soft tissue abnormality. Sinuses/Orbits: Globes orbital soft tissues within normal limits. Paranasal sinuses are largely clear. No mastoid effusion. Inner ear structures normal. Other: None. IMPRESSION: 1. No acute intracranial abnormality. 2. 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 3. Mild age-related cerebral atrophy with chronic small vessel ischemic disease. Electronically Signed   By: Jeannine Boga M.D.   On: 02/02/2018 00:39     Assessment/Plan: 67 year old female with a history of seizures and recent noncompliance who presented with breakthrough seizures.  Patient at baseline today in terms of seizure activity but reports itching in her legs and ataxic gait.  Patient did receive Phenytoin load last evening in the emergency room due to a subtherapeutic Dilantin level of 4.4.  Received 1500 mg and may very well be Dilantin toxic today.  Depakote level last evening was 29.  Recommendations: 1.  Depakote, Neurontin and Lamictal to continue at home doses. 2.  We will get stat Dilantin level today.  Would dose Dilantin further based on Dilantin level results. 3.  Seizure precautions 4.  Ativan as needed prolonged seizure activity.  Alexis Goodell, MD Neurology [phone] 02/02/2018,  11:56 AM

## 2018-02-02 NOTE — Plan of Care (Signed)
VS WDL, free of falls during shift.  Denies pain, no nausea, no sz activity since arriving to unit.  No complaints.  Daughter at bedside, call bell within reach.  Bed in low position, bed alarm on, side rails padded.  WCTM.

## 2018-02-02 NOTE — H&P (Signed)
Kaylee Wolf is an 67 y.o. female.   Chief Complaint: Seizure activity HPI: The patient with past medical history of epilepsy as well as diabetes presents to the emergency department due to her physicians encouragement after missing some of her seizure medication.  The patient's seizure activity is similar to absence seizures.  Laboratory evaluation revealed low valproic acid level.  Due to the patient's continued confusion emergency department staff loaded her with fosphenytoin prior to calling the hospitalist service for admission.  Past Medical History:  Diagnosis Date  . Anxiety   . Insomnia   . Mixed hyperlipidemia   . Recurrent major depressive disorder ([place])   . RLS (restless legs syndrome)   . Seizures ([place])   . Tinea pedis of both feet   . Type 2 diabetes mellitus ([place])   . Varicose veins     Past Surgical History:  Procedure Laterality Date  . none      Family History  Problem Relation Age of Onset  . Diabetes Mother   . Heart disease Mother   . Cancer Father    Social History:  reports that she has quit smoking. Her smoking use included cigarettes. She has quit using smokeless tobacco. She reports that she does not drink alcohol or use drugs.  Allergies: No Known Allergies  Medications Prior to Admission  Medication Sig Dispense Refill  . atorvastatin (LIPITOR) 20 MG tablet Take 1 tablet (20 mg total) by mouth daily. 90 tablet 3  . Cholecalciferol (VITAMIN D-3) 1000 UNITS CAPS Take 1 capsule by mouth daily.     . divalproex (DEPAKOTE) 125 MG DR tablet Take 1 pill in am and 2 pill at night 90 tablet 1  . escitalopram (LEXAPRO) 20 MG tablet Take 1 tablet (20 mg total) by mouth daily. 30 tablet 11  . gabapentin (NEURONTIN) 400 MG capsule Take 1 capsule (400 mg total) by mouth 2 (two) times daily. 180 capsule 3  . lamoTRIgine (LAMICTAL) 150 MG tablet Take 150 mg by mouth 2 (two) times daily.     [name] lisinopril (PRINIVIL,ZESTRIL) 2.5 MG tablet Take 1 tablet (2.5 mg  total) by mouth daily. 90 tablet 3  . meloxicam (MOBIC) 15 MG tablet Take 15 mg by mouth as needed. Reported on 06/07/2016    . metFORMIN (GLUCOPHAGE) 500 MG tablet Take 1 tablet (500 mg total) by mouth 2 (two) times daily. 180 tablet 3  . omeprazole (PRILOSEC) 20 MG capsule Take 1 capsule (20 mg total) by mouth daily. Before 30 min before first meal of day 30 capsule 5  . ondansetron (ZOFRAN-ODT) 4 MG disintegrating tablet Take 1 tablet (4 mg total) by mouth 2 (two) times daily. (Patient taking differently: Take 4 mg by mouth 2 (two) times daily as needed. ) 20 tablet 1  . phenytoin (DILANTIN) 200 MG ER capsule Take 200 mg by mouth 2 (two) times daily.     . QUEtiapine (SEROQUEL) 25 MG tablet Take 1 tablet (25 mg total) by mouth 2 (two) times daily after a meal. 60 tablet 1  . blood glucose meter kit and supplies KIT Dispense based on patient and insurance preference. Check blood glucose once daily. 1 each 0  . clotrimazole (LOTRIMIN) 1 % cream Apply 1 application topically 2 (two) times daily. Reported on 06/07/2016      Results for orders placed or performed during the hospital encounter of 02/01/18 (from the past 48 hour(s))  Comprehensive metabolic panel     Status: Abnormal   Collection  Time: 02/01/18  5:59 PM  Result Value Ref Range   Sodium 141 135 - 145 mmol/L   Potassium 4.3 3.5 - 5.1 mmol/L   Chloride 103 101 - 111 mmol/L   CO2 28 22 - 32 mmol/L   Glucose, Bld 131 (H) 65 - 99 mg/dL   BUN 16 6 - 20 mg/dL   Creatinine, Ser 0.84 0.44 - 1.00 mg/dL   Calcium 9.6 8.9 - 10.3 mg/dL   Total Protein 7.2 6.5 - 8.1 g/dL   Albumin 4.2 3.5 - 5.0 g/dL   AST 19 15 - 41 U/L   ALT 15 14 - 54 U/L   Alkaline Phosphatase 115 38 - 126 U/L   Total Bilirubin 0.3 0.3 - 1.2 mg/dL   GFR calc non Af Amer >60 >60 mL/min   GFR calc Af Amer >60 >60 mL/min    Comment: (NOTE) The eGFR has been calculated using the CKD EPI equation. This calculation has not been validated in all clinical situations. eGFR's  persistently <60 mL/min signify possible Chronic Kidney Disease.    Anion gap 10 5 - 15    Comment: Performed at [facility], [address] [place], [place] [address]  CBC     Status: None   Collection Time: 02/01/18  5:59 PM  Result Value Ref Range   WBC 5.9 3.6 - 11.0 K/uL   RBC 4.73 3.80 - 5.20 MIL/uL   Hemoglobin 14.4 12.0 - 16.0 g/dL   HCT 43.3 35.0 - 47.0 %   MCV 91.6 80.0 - 100.0 fL   MCH 30.4 26.0 - 34.0 pg   MCHC 33.2 32.0 - 36.0 g/dL   RDW 14.2 11.5 - 14.5 %   Platelets 210 150 - 440 K/uL    Comment: Performed at [facility], [address] [place], [place] [address]  TSH     Status: None   Collection Time: 02/01/18  5:59 PM  Result Value Ref Range   TSH 3.027 0.350 - 4.500 uIU/mL    Comment: Performed by a 3rd Generation assay with a functional sensitivity of <=0.01 uIU/mL. Performed at [facility], [address] [place], [place] [address]   Ammonia     Status: None   Collection Time: 02/01/18  6:08 PM  Result Value Ref Range   Ammonia 11 9 - 35 umol/L    Comment: Performed at [facility], [address] [place], [place] [address]  Urinalysis, Complete w Microscopic     Status: Abnormal   Collection Time: 02/01/18  6:08 PM  Result Value Ref Range   Color, Urine YELLOW (A) YELLOW   APPearance CLEAR (A) CLEAR   Specific Gravity, Urine 1.010 1.005 - 1.030   pH 7.0 5.0 - 8.0   Glucose, UA NEGATIVE NEGATIVE mg/dL   Hgb urine dipstick NEGATIVE NEGATIVE   Bilirubin Urine NEGATIVE NEGATIVE   Ketones, ur NEGATIVE NEGATIVE mg/dL   Protein, ur NEGATIVE NEGATIVE mg/dL   Nitrite NEGATIVE NEGATIVE   Leukocytes, UA MODERATE (A) NEGATIVE   RBC / HPF 0-5 0 - 5 RBC/hpf   WBC, UA 0-5 0 - 5 WBC/hpf   Bacteria, UA NONE SEEN NONE SEEN   Squamous Epithelial / LPF 0-5 (A) NONE SEEN    Comment: Performed at [facility], [address] [place], [place] [address]  Glucose, capillary     Status: Abnormal   Collection Time:  02/01/18  6:18 PM  Result Value Ref Range   Glucose-Capillary 122 (H)  65 - 99 mg/dL   Comment 1 Notify RN    Comment 2 Document in Chart   Phenytoin level, total     Status: Abnormal   Collection Time: 02/01/18  9:55 PM  Result Value Ref Range   Phenytoin Lvl 4.4 (L) 10.0 - 20.0 ug/mL    Comment: Performed at [facility], [address] [place], [place] [address]  Valproic acid level     Status: Abnormal   Collection Time: 02/01/18  9:55 PM  Result Value Ref Range   Valproic Acid Lvl 29 (L) 50.0 - 100.0 ug/mL    Comment: Performed at [facility], [address], [place], [place] [address]  Influenza panel by PCR (type A & B)     Status: None   Collection Time: 02/02/18  2:19 AM  Result Value Ref Range   Influenza A By PCR NEGATIVE NEGATIVE   Influenza B By PCR NEGATIVE NEGATIVE    Comment: (NOTE) The Xpert Xpress Flu assay is intended as an aid in the diagnosis of  influenza and should not be used as a sole basis for treatment.  This  assay is FDA approved for nasopharyngeal swab specimens only. Nasal  washings and aspirates are unacceptable for Xpert Xpress Flu testing. Performed at [facility], [address] [place], [place] [address]    Dg Chest 2 View  Result Date: 02/02/2018 CLINICAL DATA:  Acute onset of cough.  Status post seizure. EXAM: CHEST  2 VIEW COMPARISON:  Chest radiograph performed 08/22/2016 FINDINGS: The lungs are well-aerated. Mild left basilar opacity likely reflects atelectasis. There is no evidence of pleural effusion or pneumothorax. The heart is normal in size; the mediastinal contour is within normal limits. No acute osseous abnormalities are seen. IMPRESSION: Mild left basilar airspace opacity likely reflects atelectasis. Lungs otherwise clear. Electronically Signed   By: Garald Balding M.D.   On: 02/02/2018 02:58   Ct Head Wo Contrast  Result Date: 02/01/2018 CLINICAL DATA:  Acute onset of altered mental status. EXAM: CT  HEAD WITHOUT CONTRAST TECHNIQUE: Contiguous axial images were obtained from the base of the skull through the vertex without intravenous contrast. COMPARISON:  CT of the head performed 08/22/2016 FINDINGS: Brain: No evidence of acute infarction, hemorrhage, hydrocephalus, extra-axial collection or mass lesion / mass effect. Prominence of the sulci reflects mild cortical volume loss. Mild periventricular white matter change likely reflects small vessel ischemic microangiopathy. The brainstem and fourth ventricle are within normal limits. The basal ganglia are unremarkable in appearance. The cerebral hemispheres demonstrate grossly normal gray-white differentiation. No mass effect or midline shift is seen. Vascular: No hyperdense vessel or unexpected calcification. Skull: No free fluid is identified within the pelvis. The colon is unremarkable in appearance; the appendix is normal in caliber, and contains air. Sinuses/Orbits: The visualized portions of the orbits are within normal limits. The paranasal sinuses and mastoid air cells are well-aerated. Other: No significant soft tissue abnormalities are seen. IMPRESSION: 1. No acute intracranial pathology seen on CT. 2. Mild cortical volume loss and scattered small vessel ischemic microangiopathy. Electronically Signed   By: Garald Balding M.D.   On: 02/01/2018 21:15   Mr Brain Wo Contrast  Result Date: 02/02/2018 CLINICAL DATA:  Initial evaluation for acute altered mental status EXAM: MRI HEAD WITHOUT CONTRAST TECHNIQUE: Multiplanar, multiecho pulse sequences of the brain and surrounding structures were obtained without intravenous contrast. COMPARISON:  Prior CT from earlier the same day. FINDINGS: Brain: Diffuse prominence of the CSF containing  spaces compatible with generalized age-related cerebral atrophy. Patchy and confluent T2/FLAIR hyperintensity within the periventricular deep white matter both cerebral hemispheres most consistent with chronic small vessel  ischemic disease, mild in nature. Encephalomalacia at the inferior right cingulate gyrus consistent with remote ischemic infarct. Few scattered subcentimeter remote bilateral cerebellar infarcts noted. No abnormal foci of restricted diffusion to suggest acute or subacute ischemia. Gray-white matter differentiation maintained. No other areas of chronic infarction. No evidence for acute or chronic intracranial hemorrhage. No mass lesion, midline shift or mass effect. No hydrocephalus. No extra-axial fluid collection. Major dural sinuses are grossly patent. Pituitary gland suprasellar region normal. Midline structures intact and normal. Vascular: Major intracranial vascular flow voids are maintained at the skull base. Skull and upper cervical spine: Craniocervical junction normal. Bone marrow signal intensity within normal limits. No scalp soft tissue abnormality. Sinuses/Orbits: Globes orbital soft tissues within normal limits. Paranasal sinuses are largely clear. No mastoid effusion. Inner ear structures normal. Other: None. IMPRESSION: 1. No acute intracranial abnormality. 2. 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 3. Mild age-related cerebral atrophy with chronic small vessel ischemic disease. Electronically Signed   By: Jeannine Boga M.D.   On: 02/02/2018 00:39    Review of Systems  Constitutional: Negative for chills and fever.  HENT: Negative for sore throat and tinnitus.   Eyes: Negative for blurred vision and redness.  Respiratory: Negative for cough and shortness of breath.   Cardiovascular: Negative for chest pain, palpitations, orthopnea and PND.  Gastrointestinal: Negative for abdominal pain, diarrhea, nausea and vomiting.  Genitourinary: Negative for dysuria, frequency and urgency.  Musculoskeletal: Negative for joint pain and myalgias.  Skin: Negative for rash.       No lesions  Neurological: Positive for seizures.  Negative for speech change, focal weakness and weakness.  Endo/Heme/Allergies: Does not bruise/bleed easily.       No temperature intolerance  Psychiatric/Behavioral: Negative for depression and suicidal ideas.    Blood pressure 110/67, pulse 85, temperature 98.2 F (36.8 C), resp. rate 18, height '5\' 5"'  (1.651 m), weight 82.9 kg (182 lb 12.2 oz), last menstrual period 05/10/2012, SpO2 96 %. Physical Exam  Vitals reviewed. Constitutional: She is oriented to person, place, and time. She appears well-developed and well-nourished. No distress.  HENT:  Head: Normocephalic and atraumatic.  Mouth/Throat: Oropharynx is clear and moist.  Eyes: Conjunctivae and EOM are normal. Pupils are equal, round, and reactive to light. No scleral icterus.  Neck: Normal range of motion. Neck supple. No JVD present. No tracheal deviation present. No thyromegaly present.  Cardiovascular: Normal rate, regular rhythm and normal heart sounds. Exam reveals no gallop and no friction rub.  No murmur heard. Respiratory: Effort normal and breath sounds normal.  GI: Soft. Bowel sounds are normal. She exhibits no distension. There is no tenderness.  Genitourinary:  Genitourinary Comments: Deferred  Musculoskeletal: Normal range of motion. She exhibits no edema.  Lymphadenopathy:    She has no cervical adenopathy.  Neurological: She is alert and oriented to person, place, and time. No cranial nerve deficit. She exhibits normal muscle tone.  Skin: Skin is warm and dry. No rash noted. No erythema.  Psychiatric: She has a normal mood and affect. Her behavior is normal. Judgment and thought content normal.     Assessment/Plan This is a 67 year old female admitted for confusion. 1.  Confusion: Postictal.  Patient has epilepsy which is difficult to control.  Will monitor for seizure activity and try to  improve valproic acid level.  Consult neurology.  Continue lamotrigine and Dilantin. 2.  Hypertension: Controlled; continue  lisinopril 3.  Diabetes mellitus type 2: Hold oral hypoglycemics.  Sliding-scale insulin while hospitalized 4.  Depression: May contribute to seizure syndrome.  Continue Lexapro and Seroquel next line  5.  DVT prophylaxis: Lovenox 6.  GI prophylaxis: Pantoprazole per home regimen next line the patient is a full code.  Time spent on admission orders and patient care approximately 45 minutes  Harrie Foreman, MD 02/02/2018, 6:56 AM

## 2018-02-02 NOTE — ED Notes (Signed)
[name] (Daughter) [phone]

## 2018-02-03 DIAGNOSIS — B353 Tinea pedis: Secondary | ICD-10-CM | POA: Diagnosis present

## 2018-02-03 DIAGNOSIS — E782 Mixed hyperlipidemia: Secondary | ICD-10-CM | POA: Diagnosis present

## 2018-02-03 DIAGNOSIS — Z833 Family history of diabetes mellitus: Secondary | ICD-10-CM | POA: Diagnosis not present

## 2018-02-03 DIAGNOSIS — G40909 Epilepsy, unspecified, not intractable, without status epilepticus: Secondary | ICD-10-CM | POA: Diagnosis present

## 2018-02-03 DIAGNOSIS — G4700 Insomnia, unspecified: Secondary | ICD-10-CM | POA: Diagnosis present

## 2018-02-03 DIAGNOSIS — G2581 Restless legs syndrome: Secondary | ICD-10-CM | POA: Diagnosis present

## 2018-02-03 DIAGNOSIS — F419 Anxiety disorder, unspecified: Secondary | ICD-10-CM | POA: Diagnosis present

## 2018-02-03 DIAGNOSIS — Z9114 Patient's other noncompliance with medication regimen: Secondary | ICD-10-CM | POA: Diagnosis not present

## 2018-02-03 DIAGNOSIS — R413 Other amnesia: Secondary | ICD-10-CM | POA: Diagnosis present

## 2018-02-03 DIAGNOSIS — I10 Essential (primary) hypertension: Secondary | ICD-10-CM | POA: Diagnosis present

## 2018-02-03 DIAGNOSIS — H6690 Otitis media, unspecified, unspecified ear: Secondary | ICD-10-CM | POA: Diagnosis present

## 2018-02-03 DIAGNOSIS — R55 Syncope and collapse: Secondary | ICD-10-CM | POA: Diagnosis present

## 2018-02-03 DIAGNOSIS — Z87891 Personal history of nicotine dependence: Secondary | ICD-10-CM | POA: Diagnosis not present

## 2018-02-03 DIAGNOSIS — R569 Unspecified convulsions: Secondary | ICD-10-CM | POA: Diagnosis not present

## 2018-02-03 DIAGNOSIS — Z79899 Other long term (current) drug therapy: Secondary | ICD-10-CM | POA: Diagnosis not present

## 2018-02-03 DIAGNOSIS — Z7984 Long term (current) use of oral hypoglycemic drugs: Secondary | ICD-10-CM | POA: Diagnosis not present

## 2018-02-03 DIAGNOSIS — E119 Type 2 diabetes mellitus without complications: Secondary | ICD-10-CM | POA: Diagnosis present

## 2018-02-03 LAB — BASIC METABOLIC PANEL
ANION GAP: 5 (ref 5–15)
BUN: 13 mg/dL (ref 6–20)
CO2: 32 mmol/L (ref 22–32)
Calcium: 8.7 mg/dL — ABNORMAL LOW (ref 8.9–10.3)
Chloride: 106 mmol/L (ref 101–111)
Creatinine, Ser: 0.77 mg/dL (ref 0.44–1.00)
GFR calc Af Amer: >60 mL/min (ref >60)
GFR calc non Af Amer: >60 mL/min (ref >60)
GLUCOSE: 109 mg/dL — AB (ref 65–99)
POTASSIUM: 3.5 mmol/L (ref 3.5–5.1)
Sodium: 143 mmol/L (ref 135–145)

## 2018-02-03 LAB — GLUCOSE, CAPILLARY
Glucose-Capillary: 107 mg/dL — ABNORMAL HIGH (ref 65–99)
Glucose-Capillary: 165 mg/dL — ABNORMAL HIGH (ref 65–99)
Glucose-Capillary: 181 mg/dL — ABNORMAL HIGH (ref 65–99)
Glucose-Capillary: 106 mg/dL — ABNORMAL HIGH (ref 65–99)
Glucose-Capillary: 151 mg/dL — ABNORMAL HIGH (ref 65–99)

## 2018-02-03 LAB — LIPID PANEL
Cholesterol: 145 mg/dL (ref 0–200)
HDL: 54 mg/dL (ref >40)
LDL Cholesterol: 66 mg/dL (ref 0–99)
Total CHOL/HDL Ratio: 2.7 RATIO
Triglycerides: 125 mg/dL (ref <150)
VLDL: 25 mg/dL (ref 0–40)

## 2018-02-03 LAB — LAMOTRIGINE LEVEL: LAMOTRIGINE LVL: 3.6 ug/mL (ref 2.0–20.0)

## 2018-02-03 LAB — PHENYTOIN LEVEL, TOTAL: PHENYTOIN LVL: 16 ug/mL (ref 10.0–20.0)

## 2018-02-03 MED ORDER — SODIUM CHLORIDE 0.9 % IV SOLN
INTRAVENOUS | Status: DC
  Administered 2018-02-03 – 2018-02-04 (×2): via INTRAVENOUS

## 2018-02-03 MED ORDER — AMOXICILLIN-POT CLAVULANATE 875-125 MG PO TABS: 1.0000 | ORAL_TABLET | Freq: Two times a day (BID) | ORAL | 0 refills | Status: DC

## 2018-02-03 MED ORDER — ASPIRIN 81 MG PO TBEC: 81.0000 mg | DELAYED_RELEASE_TABLET | Freq: Every day | ORAL | 0 refills | Status: AC

## 2018-02-03 MED ORDER — ASPIRIN EC 81 MG PO TBEC
81.0000 mg | DELAYED_RELEASE_TABLET | Freq: Every day | ORAL | Status: DC
  Administered 2018-02-03 – 2018-02-04 (×2): 81 mg via ORAL
  Filled 2018-02-03 (×2): qty 1

## 2018-02-03 MED ORDER — AMOXICILLIN-POT CLAVULANATE 875-125 MG PO TABS
1.0000 | ORAL_TABLET | Freq: Two times a day (BID) | ORAL | Status: DC
  Administered 2018-02-03 – 2018-02-04 (×3): 1 via ORAL
  Filled 2018-02-03 (×3): qty 1

## 2018-02-03 MED ORDER — SODIUM CHLORIDE 0.9 % IV BOLUS (SEPSIS)
500.0000 mL | Freq: Once | INTRAVENOUS | Status: AC
  Administered 2018-02-03: 500 mL via INTRAVENOUS

## 2018-02-03 NOTE — Progress Notes (Signed)
Patient ID: Kaylee Wolf, female   DOB: [birth year], 67 y.o.   MRN: [id number]  [facility] PROGRESS NOTE  DREANA BRITZ VOZ:[id number] DOB: [birth year] DOA: 02/01/2018 PCP: Olin Hauser, DO  HPI/Subjective: Patient was feeling better when I saw her.  Was not as dizzy anymore.  Actually did well with physical therapy and they recommended home with home health.  Little later she ended up standing up to go to the bathroom and ended up almost passing out.  Blood pressure was low at the time.  Discharge was canceled.  Objective: Vitals:   02/03/18 1245 02/03/18 1401  BP: (!) 93/54 (!) 93/45  Pulse: 64 70  Resp:  18  Temp:  98.6 F (37 C)  SpO2:  93%    Filed Weights   02/01/18 1741 02/02/18 0354 02/03/18 0513  Weight: 81.6 kg (180 lb) 82.9 kg (182 lb 12.2 oz) 88.1 kg (194 lb 4.8 oz)    ROS: Review of Systems  Constitutional: Negative for chills and fever.  Eyes: Negative for blurred vision.  Respiratory: Negative for cough and shortness of breath.   Cardiovascular: Negative for chest pain.  Gastrointestinal: Negative for constipation, diarrhea, nausea and vomiting.  Genitourinary: Negative for dysuria.  Musculoskeletal: Negative for joint pain.  Neurological: Negative for dizziness and headaches.   Exam: Physical Exam  Constitutional: She is oriented to person, place, and time.  HENT:  Nose: No mucosal edema.  Mouth/Throat: No oropharyngeal exudate or posterior oropharyngeal edema.  Eyes: Conjunctivae, EOM and lids are normal. Pupils are equal, round, and reactive to light.  Neck: No JVD present. Carotid bruit is not present. No edema present. No thyroid mass and no thyromegaly present.  Cardiovascular: S1 normal and S2 normal. Exam reveals no gallop.  No murmur heard. Pulses:      Dorsalis pedis pulses are 2+ on the right side, and 2+ on the left side.  Respiratory: No respiratory distress. She has no wheezes. She has no rhonchi. She has no rales.  GI:  Soft. Bowel sounds are normal. There is no tenderness.  Musculoskeletal:       Right ankle: She exhibits no swelling.       Left ankle: She exhibits no swelling.  Lymphadenopathy:    She has no cervical adenopathy.  Neurological: She is alert and oriented to person, place, and time. No cranial nerve deficit.  Skin: Skin is warm. No rash noted. Nails show no clubbing.  Psychiatric: She has a normal mood and affect.      Data Reviewed: Basic Metabolic Panel: Recent Labs  Lab 02/01/18 1759 02/03/18 0303  NA 141 143  K 4.3 3.5  CL 103 106  CO2 28 32  GLUCOSE 131* 109*  BUN 16 13  CREATININE 0.84 0.77  CALCIUM 9.6 8.7*   Liver Function Tests: Recent Labs  Lab 02/01/18 1759  AST 19  ALT 15  ALKPHOS 115  BILITOT 0.3  PROT 7.2  ALBUMIN 4.2    Recent Labs  Lab 02/01/18 1808  AMMONIA 11   CBC: Recent Labs  Lab 02/01/18 1759  WBC 5.9  HGB 14.4  HCT 43.3  MCV 91.6  PLT 210    CBG: Recent Labs  Lab 02/02/18 1635 02/02/18 2123 02/03/18 0750 02/03/18 1144 02/03/18 1200  GLUCAP 139* 119* 107* 181* 165*      Studies: Dg Chest 2 View  Result Date: 02/02/2018 CLINICAL DATA:  Acute onset of cough.  Status post seizure. EXAM: CHEST  2 VIEW COMPARISON:  Chest radiograph performed 08/22/2016 FINDINGS: The lungs are well-aerated. Mild left basilar opacity likely reflects atelectasis. There is no evidence of pleural effusion or pneumothorax. The heart is normal in size; the mediastinal contour is within normal limits. No acute osseous abnormalities are seen. IMPRESSION: Mild left basilar airspace opacity likely reflects atelectasis. Lungs otherwise clear. Electronically Signed   By: Garald Balding M.D.   On: 02/02/2018 02:58   Ct Head Wo Contrast  Result Date: 02/01/2018 CLINICAL DATA:  Acute onset of altered mental status. EXAM: CT HEAD WITHOUT CONTRAST TECHNIQUE: Contiguous axial images were obtained from the base of the skull through the vertex without intravenous  contrast. COMPARISON:  CT of the head performed 08/22/2016 FINDINGS: Brain: No evidence of acute infarction, hemorrhage, hydrocephalus, extra-axial collection or mass lesion / mass effect. Prominence of the sulci reflects mild cortical volume loss. Mild periventricular white matter change likely reflects small vessel ischemic microangiopathy. The brainstem and fourth ventricle are within normal limits. The basal ganglia are unremarkable in appearance. The cerebral hemispheres demonstrate grossly normal gray-white differentiation. No mass effect or midline shift is seen. Vascular: No hyperdense vessel or unexpected calcification. Skull: No free fluid is identified within the pelvis. The colon is unremarkable in appearance; the appendix is normal in caliber, and contains air. Sinuses/Orbits: The visualized portions of the orbits are within normal limits. The paranasal sinuses and mastoid air cells are well-aerated. Other: No significant soft tissue abnormalities are seen. IMPRESSION: 1. No acute intracranial pathology seen on CT. 2. Mild cortical volume loss and scattered small vessel ischemic microangiopathy. Electronically Signed   By: Garald Balding M.D.   On: 02/01/2018 21:15   Mr Brain Wo Contrast  Result Date: 02/02/2018 CLINICAL DATA:  Initial evaluation for acute altered mental status EXAM: MRI HEAD WITHOUT CONTRAST TECHNIQUE: Multiplanar, multiecho pulse sequences of the brain and surrounding structures were obtained without intravenous contrast. COMPARISON:  Prior CT from earlier the same day. FINDINGS: Brain: Diffuse prominence of the CSF containing spaces compatible with generalized age-related cerebral atrophy. Patchy and confluent T2/FLAIR hyperintensity within the periventricular deep white matter both cerebral hemispheres most consistent with chronic small vessel ischemic disease, mild in nature. Encephalomalacia at the inferior right cingulate gyrus consistent with remote ischemic infarct. Few  scattered subcentimeter remote bilateral cerebellar infarcts noted. No abnormal foci of restricted diffusion to suggest acute or subacute ischemia. Gray-white matter differentiation maintained. No other areas of chronic infarction. No evidence for acute or chronic intracranial hemorrhage. No mass lesion, midline shift or mass effect. No hydrocephalus. No extra-axial fluid collection. Major dural sinuses are grossly patent. Pituitary gland suprasellar region normal. Midline structures intact and normal. Vascular: Major intracranial vascular flow voids are maintained at the skull base. Skull and upper cervical spine: Craniocervical junction normal. Bone marrow signal intensity within normal limits. No scalp soft tissue abnormality. Sinuses/Orbits: Globes orbital soft tissues within normal limits. Paranasal sinuses are largely clear. No mastoid effusion. Inner ear structures normal. Other: None. IMPRESSION: 1. No acute intracranial abnormality. 2. 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 3. Mild age-related cerebral atrophy with chronic small vessel ischemic disease. Electronically Signed   By: Jeannine Boga M.D.   On: 02/02/2018 00:39    Scheduled Meds: . amoxicillin-clavulanate  1 tablet Oral Q12H  . aspirin EC  81 mg Oral Daily  . atorvastatin  20 mg Oral Daily  . cholecalciferol  1,000 Units Oral Daily  . clotrimazole  1 application Topical BID  .  divalproex  125 mg Oral Q breakfast  . divalproex  250 mg Oral QPM  . docusate sodium  100 mg Oral BID  . enoxaparin (LOVENOX) injection  40 mg Subcutaneous Q24H  . escitalopram  20 mg Oral Daily  . gabapentin  400 mg Oral BID  . insulin aspart  0-5 Units Subcutaneous QHS  . insulin aspart  0-9 Units Subcutaneous TID WC  . lamoTRIgine  150 mg Oral BID  . pantoprazole  40 mg Oral Daily  . phenytoin  200 mg Oral BID  . QUEtiapine  25 mg Oral BID PC   Continuous Infusions: .  sodium chloride 50 mL/hr at 02/03/18 1242    Assessment/Plan:  1. Seizure secondary to noncompliance with medications.  She received IV bolus and some medications initially and then kept on her usual doses. 2. Vasovagal syncope.  IV fluid hydration.  Check orthostatic vital signs.  Hold lisinopril at this point.  Discharge canceled. 3. Otitis media.  Augmentin started 4. Type 2 diabetes mellitus on sliding scale 5. History of depression on Lexapro and Seroquel  Code Status:     Code Status Orders  (From admission, onward)        Start     Ordered   02/02/18 0340  Full code  Continuous     02/02/18 0339    Code Status History    Date Active Date Inactive Code Status Order ID Comments User Context   This patient has a current code status but no historical code status.     Family Communication: Spoke with daughter and also niece. Disposition Plan: Hopefully home tomorrow  Consultants:  Neurology  Antibiotics:  Augmentin  Time spent: 38 minutes with reevaluation and coordination of care speaking with daughter and niece and nursing staff.  Jalene Lacko [employer]

## 2018-02-03 NOTE — Evaluation (Signed)
Physical Therapy Evaluation Patient Details Name: Kaylee Wolf MRN: [id number] DOB: Apr 01, 1951 Today's Date: 02/03/2018   History of Present Illness  Pt is a 67 y/o F who presented after missing her seizure medication with confusion.  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  Pt very unsteady which is suspected to possibly be due to dilantin and depakote levels subtherapeutic on presentation.  Pt's PMH includes anxiety, RLS, seizures.    Clinical Impression  Pt admitted with above diagnosis. Pt currently with functional limitations due to the deficits listed below (see PT Problem List). Kaylee Wolf is independent at baseline with all aspects of mobility and ADLs.  She currently demonstrates instability when ambulating and ataxic gait at times, requiring up to min assist.  Pt with significant instability with horizontal or vertical head turns.  Pt becomes more steady with introduction of RW.  Encouraged pt to ambulate at least 3x/day in hallway with nursing staff.  Pt will have 24/7 assist/supervision at d/c as she will be going to her daughter's house to stay.  Pt will benefit from skilled PT to increase their independence and safety with mobility to allow discharge to the venue listed below.      Follow Up Recommendations Home health PT;Supervision/Assistance - 24 hour    Equipment Recommendations  None recommended by PT    Recommendations for Other Services       Precautions / Restrictions Precautions Precautions: Fall;Other (comment) Precaution Comments: seizure precautions Restrictions Weight Bearing Restrictions: No      Mobility  Bed Mobility Overal bed mobility: Independent             General bed mobility comments: No signs of instability, pt steady.  Pt performs independently.   Transfers Overall transfer level: Needs assistance Equipment used: Rolling walker (2 wheeled) Transfers: Sit to/from Stand Sit to Stand: Min guard          General transfer comment: Pt requires close min guard assist as pt demonstrates mild instability but no LOB.    Ambulation/Gait Ambulation/Gait assistance: Min guard;Min assist Ambulation Distance (Feet): 250 Feet Assistive device: Rolling walker (2 wheeled);None Gait Pattern/deviations: Step-through pattern;Ataxic;Staggering right;Staggering left   Gait velocity interpretation: at or above normal speed for age/gender General Gait Details: Pt ambulates to bathroom without AD and staggers L and R due to what presents as ataxic gait.  Pt requires min assist to correct.  Pt then ambulated 40 ft with RW with improved stability but some unsteadiness with head turns.  Pt then ambulated 200 ft without AD with mild instability requiring occasional min assist.  Pt significantly off balance with horizontal and vertical head turns.   Stairs            Wheelchair Mobility    Modified Rankin (Stroke Patients Only)       Balance Overall balance assessment: Needs assistance Sitting-balance support: No upper extremity supported;Feet supported Sitting balance-Leahy Scale: Good     Standing balance support: No upper extremity supported;During functional activity Standing balance-Leahy Scale: Poor Standing balance comment: Pt requires intermittent min assist with dynamic activities without UE support                             Pertinent Vitals/Pain Pain Assessment: No/denies pain    Home Living Family/patient expects to be discharged to:: Private residence Living Arrangements: Children Available Help at Discharge: Family;Available 24 hours/day Type of Home: House Home Access: Stairs  to enter Entrance Stairs-Rails: (pt thinks so but is not sure) Entrance Stairs-Number of Steps: 3 Home Layout: One level Home Equipment: Walker - 2 wheels;Cane - single point Additional Comments: Pt to stay with her daughter at d/c, information above is a reflection of her daughter's  home    Prior Function Level of Independence: Independent         Comments: Pt reports that at baseline she is independent not ambulating without AD.  No falls in the past 6 months.  Pt is independent with ADLs.  Pt does not drive, does not have a license.      Hand Dominance        Extremity/Trunk Assessment   Upper Extremity Assessment Upper Extremity Assessment: (BUE strength grossly 5/5)    Lower Extremity Assessment Lower Extremity Assessment: (BLE strength 5/5; coordination WNL)       Communication   Communication: HOH  Cognition Arousal/Alertness: Awake/alert Behavior During Therapy: WFL for tasks assessed/performed Overall Cognitive Status: No family/caregiver present to determine baseline cognitive functioning Area of Impairment: Orientation                 Orientation Level: Disoriented to;Time(says it is year 56)                    General Comments      Exercises Other Exercises Other Exercises: Encouraged pt to ambulate in hallway with nursing staff at least 3x/day.    Assessment/Plan    PT Assessment Patient needs continued PT services  PT Problem List Decreased balance;Decreased cognition;Decreased knowledge of use of DME;Decreased safety awareness       PT Treatment Interventions DME instruction;Gait training;Functional mobility training;Therapeutic activities;Therapeutic exercise;Balance training;Neuromuscular re-education;Patient/family education;Cognitive remediation    PT Goals (Current goals can be found in the Care Plan section)  Acute Rehab PT Goals Patient Stated Goal: to go to her daughter's following d/c.  Regain independence PT Goal Formulation: With patient Time For Goal Achievement: 02/17/18 Potential to Achieve Goals: Good    Frequency Min 2X/week   Barriers to discharge        Co-evaluation               AM-PAC PT "6 Clicks" Daily Activity  Outcome Measure Difficulty turning over in bed (including  adjusting bedclothes, sheets and blankets)?: None Difficulty moving from lying on back to sitting on the side of the bed? : None Difficulty sitting down on and standing up from a chair with arms (e.g., wheelchair, bedside commode, etc,.)?: A Little Help needed moving to and from a bed to chair (including a wheelchair)?: A Lot Help needed walking in hospital room?: A Lot Help needed climbing 3-5 steps with a railing? : A Lot 6 Click Score: 17    End of Session Equipment Utilized During Treatment: Gait belt Activity Tolerance: Patient tolerated treatment well Patient left: in chair;with call bell/phone within reach;with chair alarm set Nurse Communication: Mobility status PT Visit Diagnosis: Unsteadiness on feet (R26.81);Difficulty in walking, not elsewhere classified (R26.2)    Time: 9449-6759 PT Time Calculation (min) (ACUTE ONLY): 22 min   Charges:   PT Evaluation $PT Eval Low Complexity: 1 Low PT Treatments $Gait Training: 8-22 mins   PT G Codes:        Collie Siad PT, DPT 02/03/2018, 10:34 AM

## 2018-02-03 NOTE — Discharge Instructions (Signed)
Seizure, Adult A seizure is a sudden burst of abnormal electrical activity in the brain. The abnormal activity temporarily interrupts normal brain function, causing a person to experience any of the following:  Involuntary movements.  Changes in awareness or consciousness.  Uncontrollable shaking (convulsions).  Seizures usually last from 30 seconds to 2 minutes. They usually do not cause permanent brain damage unless they are prolonged. What can cause a seizure to happen? Seizures can happen for many reasons including:  A fever.  Low blood sugar.  A medicine.  An illnesses.  A brain injury.  Some people who have a seizure never have another one. People who have repeated seizures have a condition called epilepsy. What are the symptoms of a seizure? Symptoms of a seizure vary greatly from person to person. They include:  Convulsions.  Stiffening of the body.  Involuntary movements of the arms or legs.  Loss of consciousness.  Breathing problems.  Falling suddenly.  Confusion.  Head nodding.  Eye blinking or fluttering.  Lip smacking.  Drooling.  Rapid eye movements.  Grunting.  Loss of bladder control and bowel control.  Staring.  Unresponsiveness.  Some people have symptoms right before a seizure happens (aura) and right after a seizure happens. Symptoms of an aura include:  Fear or anxiety.  Nausea.  Feeling like the room is spinning (vertigo).  A feeling of having seen or heard something before (deja vu).  Odd tastes or smells.  Changes in vision, such as seeing flashing lights or spots.  Symptoms that may follow a seizure include:  Confusion.  Sleepiness.  Headache.  Weakness of one side of the body.  Follow these instructions at home: Medicines   Take over-the-counter and prescription medicines only as told by your health care provider.  Avoid any substances that may prevent your medicine from working properly, such as  alcohol. Activity  Do not drive, swim, or do any other activities that would be dangerous if you had another seizure. Wait until your health care provider approves.  If you live in the [place] check with your local DMV (department of motor vehicles) to find out about the local driving laws. Each state has specific rules about when you can legally return to driving.  Get enough rest. Lack of sleep can make seizures more likely to occur. Educating others Teach friends and family what to do if you have a seizure. They should:  Lay you on the ground to prevent a fall.  Cushion your head and body.  Loosen any tight clothing around your neck.  Turn you on your side. If vomiting occurs, this helps keep your airway clear.  Stay with you until you recover.  Not hold you down. Holding you down will not stop the seizure.  Not put anything in your mouth.  Know whether or not you need emergency care.  General instructions  Contact your health care provider each time you have a seizure.  Avoid anything that has ever triggered a seizure for you.  Keep a seizure diary. Record what you remember about each seizure, especially anything that might have triggered the seizure.  Keep all follow-up visits as told by your health care provider. This is important. Contact a health care provider if:  You have another seizure.  You have seizures more often.  Your seizure symptoms change.  You continue to have seizures with treatment.  You have symptoms of an infection or illness. They might increase your risk of having a seizure. Get  help right away if:  You have a seizure: ? That lasts longer than 5 minutes. ? That is different than previous seizures. ? That leaves you unable to speak or use a part of your body. ? That makes it harder to breathe. ? After a head injury.  You have: ? Multiple seizures in a row. ? Confusion or a severe headache right after a seizure.  You are having  seizures more often.  You do not wake up immediately after a seizure.  You injure yourself during a seizure. These symptoms may represent a serious problem that is an emergency. Do not wait to see if the symptoms will go away. Get medical help right away. Call your local emergency services (911 in the [place] Do not drive yourself to the hospital. This information is not intended to replace advice given to you by your health care provider. Make sure you discuss any questions you have with your health care provider. Document Released: 12/10/2000 Document Revised: 08/08/2016 Document Reviewed: 07/16/2016 Elsevier Interactive Patient Education  2018 [employer]. Seizures may happen at any time. It is important to take certain precautions to maintain your safety.   Follow up with your doctor in 1-3 days.  If you were started on a seizure medication, take it as prescribed.  During a seizure, a person may injure himself or herself. Seizure precautions are guidelines that a person can follow in order to minimize injury during a seizure. For any activity, it is important to ask, "What would happen if I had a seizure while doing this?" Follow the below precautions.  Bathroom Safety  A person with seizures may want to shower instead of bathe to avoid accidental drowning. If falls occur during the patient's typical seizure, a person should use a shower seat, preferably one with a safety strap.   Use nonskid strips in your shower or tub.   Never use electrical equipment near water. This prevents accidental electrocution.   Consider changing glass in shower doors to shatterproof glass.  Risk analyst  If possible, cook when someone else is nearby.   Use the back burners of the stove to prevent accidental burns.   Use shatterproof containers as much as possible. For instance, sauces can be transferred from glass bottles to plastic containers for use.   Limit time that is required using knives  or other sharp objects. If possible, buy foods that are already cut, or ask someone to help in meal preparation.   General Safety at [place] not smoke or light fires in the fireplace unless someone else is present.   Do not use space heaters that can be accidentally overturned.   When alone, avoid using step stools or ladders, and do not clean rooftop gutters.   Purchase power tools and motorized Company secretary which have a safety switch that will stop the machine if you release the handle (a 'dead man's' switch).   Driving and Transportation  Avoid driving unless your seizures are well controlled and/or you have permission to drive from your state's Department of Motor Vehicles  [facility]). Each state has different laws. Please refer to the following link on the [place] website for more information: http://www.epilepsyfoundation.org/answerplace/Social/driving/drivingu.cfm   If you ride a bicycle, wear a helmet and any other necessary protective gear.   When taking public transportation like the bus or subway, stay clear of the platform edge.   Outdoor Materials engineer is okay, but does present certain risks.  Never swim alone, and tell friends what to do if you have a seizure while swimming.   Wear appropriate protective equipment.   Ski with a friend. If a seizure occurs, your friend can seek help, if needed. He or she can also help to get you out of the cold. Consider using a safety hook or belt while riding the ski lift.

## 2018-02-03 NOTE — Progress Notes (Signed)
Subjective: Patient reports no further tingling in her lower extremities.  Although she does not feel that her gait is at baseline is much improved from yesterday.  Objective: Current vital signs: BP (!) 113/58 (BP Location: Left Arm)   Pulse 67   Temp 98.6 F (37 C) (Oral)   Resp 18   Ht 5\' 5"  (1.651 m)   Wt 88.1 kg (194 lb 4.8 oz)   LMP 05/10/2012   SpO2 92%   BMI 32.33 kg/m  Vital signs in last 24 hours: Temp:  [98.1 F (36.7 C)-98.6 F (37 C)] 98.6 F (37 C) (02/08 0947) Pulse Rate:  [67-95] 67 (02/08 0513) Resp:  [18-20] 18 (02/08 0947) BP: (96-113)/(56-63) 113/58 (02/08 0947) SpO2:  [92 %-97 %] 92 % (02/08 0947) Weight:  [88.1 kg (194 lb 4.8 oz)] 88.1 kg (194 lb 4.8 oz) (02/08 0513)  Intake/Output from previous day: 02/07 0701 - 02/08 0700 In: 120 [P.O.:120] Out: -  Intake/Output this shift: Total I/O In: 240 [P.O.:240] Out: -  Nutritional status: Diet heart healthy/carb modified Room service appropriate? Yes; Fluid consistency: Thin  Neurologic Exam: Mental Status: Alert, oriented.  Speech fluent without evidence of aphasia.  Able to follow commands Cranial Nerves: II: Discs flat bilaterally; Visual fields grossly normal, pupils equal, round, reactive to light and accommodation III,IV, VI: ptosis not present, extra-ocular motions intact bilaterally V,VII: smile symmetric, facial light touch sensation normal bilaterally VIII: hearing normal bilaterally IX,X: gag reflex present XI: bilateral shoulder shrug XII: midline tongue extension Motor: 5/5 throughout  Lab Results: Basic Metabolic Panel: Recent Labs  Lab 02/01/18 1759 02/03/18 0303  NA 141 143  K 4.3 3.5  CL 103 106  CO2 28 32  GLUCOSE 131* 109*  BUN 16 13  CREATININE 0.84 0.77  CALCIUM 9.6 8.7*    Liver Function Tests: Recent Labs  Lab 02/01/18 1759  AST 19  ALT 15  ALKPHOS 115  BILITOT 0.3  PROT 7.2  ALBUMIN 4.2   No results for input(s): LIPASE, AMYLASE in the last 168  hours. Recent Labs  Lab 02/01/18 1808  AMMONIA 11    CBC: Recent Labs  Lab 02/01/18 1759  WBC 5.9  HGB 14.4  HCT 43.3  MCV 91.6  PLT 210    Cardiac Enzymes: No results for input(s): CKTOTAL, CKMB, CKMBINDEX, TROPONINI in the last 168 hours.  Lipid Panel: Recent Labs  Lab 02/03/18 0303  CHOL 145  TRIG 125  HDL 54  CHOLHDL 2.7  VLDL 25  LDLCALC 66    CBG: Recent Labs  Lab 02/02/18 0744 02/02/18 1152 02/02/18 1635 02/02/18 2123 02/03/18 0750  GLUCAP 133* 141* 139* 119* 107*    Microbiology: No results found for this or any previous visit.  Coagulation Studies: No results for input(s): LABPROT, INR in the last 72 hours.  Imaging: Dg Chest 2 View  Result Date: 02/02/2018 CLINICAL DATA:  Acute onset of cough.  Status post seizure. EXAM: CHEST  2 VIEW COMPARISON:  Chest radiograph performed 08/22/2016 FINDINGS: The lungs are well-aerated. Mild left basilar opacity likely reflects atelectasis. There is no evidence of pleural effusion or pneumothorax. The heart is normal in size; the mediastinal contour is within normal limits. No acute osseous abnormalities are seen. IMPRESSION: Mild left basilar airspace opacity likely reflects atelectasis. Lungs otherwise clear. Electronically Signed   By: Garald Balding M.D.   On: 02/02/2018 02:58   Ct Head Wo Contrast  Result Date: 02/01/2018 CLINICAL DATA:  Acute onset of altered mental  status. EXAM: CT HEAD WITHOUT CONTRAST TECHNIQUE: Contiguous axial images were obtained from the base of the skull through the vertex without intravenous contrast. COMPARISON:  CT of the head performed 08/22/2016 FINDINGS: Brain: No evidence of acute infarction, hemorrhage, hydrocephalus, extra-axial collection or mass lesion / mass effect. Prominence of the sulci reflects mild cortical volume loss. Mild periventricular white matter change likely reflects small vessel ischemic microangiopathy. The brainstem and fourth ventricle are within normal  limits. The basal ganglia are unremarkable in appearance. The cerebral hemispheres demonstrate grossly normal gray-white differentiation. No mass effect or midline shift is seen. Vascular: No hyperdense vessel or unexpected calcification. Skull: No free fluid is identified within the pelvis. The colon is unremarkable in appearance; the appendix is normal in caliber, and contains air. Sinuses/Orbits: The visualized portions of the orbits are within normal limits. The paranasal sinuses and mastoid air cells are well-aerated. Other: No significant soft tissue abnormalities are seen. IMPRESSION: 1. No acute intracranial pathology seen on CT. 2. Mild cortical volume loss and scattered small vessel ischemic microangiopathy. Electronically Signed   By: Garald Balding M.D.   On: 02/01/2018 21:15   Mr Brain Wo Contrast  Result Date: 02/02/2018 CLINICAL DATA:  Initial evaluation for acute altered mental status EXAM: MRI HEAD WITHOUT CONTRAST TECHNIQUE: Multiplanar, multiecho pulse sequences of the brain and surrounding structures were obtained without intravenous contrast. COMPARISON:  Prior CT from earlier the same day. FINDINGS: Brain: Diffuse prominence of the CSF containing spaces compatible with generalized age-related cerebral atrophy. Patchy and confluent T2/FLAIR hyperintensity within the periventricular deep white matter both cerebral hemispheres most consistent with chronic small vessel ischemic disease, mild in nature. Encephalomalacia at the inferior right cingulate gyrus consistent with remote ischemic infarct. Few scattered subcentimeter remote bilateral cerebellar infarcts noted. No abnormal foci of restricted diffusion to suggest acute or subacute ischemia. Gray-white matter differentiation maintained. No other areas of chronic infarction. No evidence for acute or chronic intracranial hemorrhage. No mass lesion, midline shift or mass effect. No hydrocephalus. No extra-axial fluid collection. Major dural  sinuses are grossly patent. Pituitary gland suprasellar region normal. Midline structures intact and normal. Vascular: Major intracranial vascular flow voids are maintained at the skull base. Skull and upper cervical spine: Craniocervical junction normal. Bone marrow signal intensity within normal limits. No scalp soft tissue abnormality. Sinuses/Orbits: Globes orbital soft tissues within normal limits. Paranasal sinuses are largely clear. No mastoid effusion. Inner ear structures normal. Other: None. IMPRESSION: 1. No acute intracranial abnormality. 2. 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 3. Mild age-related cerebral atrophy with chronic small vessel ischemic disease. Electronically Signed   By: Jeannine Boga M.D.   On: 02/02/2018 00:39    Medications:  I have reviewed the patient's current medications. Scheduled: . amoxicillin-clavulanate  1 tablet Oral Q12H  . aspirin EC  81 mg Oral Daily  . atorvastatin  20 mg Oral Daily  . cholecalciferol  1,000 Units Oral Daily  . clotrimazole  1 application Topical BID  . divalproex  125 mg Oral Q breakfast  . divalproex  250 mg Oral QPM  . docusate sodium  100 mg Oral BID  . enoxaparin (LOVENOX) injection  40 mg Subcutaneous Q24H  . escitalopram  20 mg Oral Daily  . gabapentin  400 mg Oral BID  . insulin aspart  0-5 Units Subcutaneous QHS  . insulin aspart  0-9 Units Subcutaneous TID WC  . lamoTRIgine  150 mg Oral BID  . lisinopril  2.5 mg Oral Daily  . pantoprazole  40 mg Oral Daily  . phenytoin  200 mg Oral BID  . QUEtiapine  25 mg Oral BID PC    Assessment/Plan: Patient back on home doses of medications.  No further tingling in lower extremities.  Gait improved.  Dilantin level 16.  Recommendations: 1.  We will continue patient on home doses of anticonvulsants.  I would expect symptoms of difficulty with gait and tingling in the lower extremities to continue to  improve. Patient to continue follow up with outpatient physicians   LOS: 0 days   Alexis Goodell, MD Neurology [phone] 02/03/2018  11:42 AM

## 2018-02-04 LAB — CBC
HCT: 38.7 % (ref 35.0–47.0)
Hemoglobin: 12.9 g/dL (ref 12.0–16.0)
MCH: 30.4 pg (ref 26.0–34.0)
MCHC: 33.3 g/dL (ref 32.0–36.0)
MCV: 91.3 fL (ref 80.0–100.0)
Platelets: 177 10*3/uL (ref 150–440)
RBC: 4.24 MIL/uL (ref 3.80–5.20)
RDW: 14.2 % (ref 11.5–14.5)
WBC: 4 10*3/uL (ref 3.6–11.0)

## 2018-02-04 LAB — GLUCOSE, CAPILLARY
Glucose-Capillary: 131 mg/dL — ABNORMAL HIGH (ref 65–99)
Glucose-Capillary: 149 mg/dL — ABNORMAL HIGH (ref 65–99)

## 2018-02-04 NOTE — Care Management Note (Signed)
Case Management Note  Patient Details  Name: Kaylee Wolf MRN: [id number] Date of Birth: [birth year]  Subjective/Objective:        Referral to [facility] requesting HH-PT.             Action/Plan:   Expected Discharge Date:  02/04/18               Expected Discharge Plan:     In-House Referral:     Discharge planning Services     Post Acute Care Choice:    Choice offered to:     DME Arranged:    DME Agency:     HH Arranged:    HH Agency:     Status of Service:     If discussed at [employer], dates discussed:    Additional Comments:  Kaylee Swinger A, RN 02/04/2018, 2:04 PM

## 2018-02-06 ENCOUNTER — Telehealth: Payer: Self-pay

## 2018-02-06 NOTE — Telephone Encounter (Signed)
I have made the 1st attempt to contact the patient or family member in charge, in order to follow up from recently being discharged from the hospital. I left a message on voicemail but I will make another attempt at a different time.   Direct call back [phone] 

## 2018-02-08 NOTE — Discharge Summary (Signed)
[facility] NAME: Kaylee Wolf    MR#:  [id number]  DATE OF BIRTH:  [birth year]  DATE OF ADMISSION:  02/01/2018 ADMITTING PHYSICIAN: Harrie Foreman, MD  DATE OF DISCHARGE: 02/04/2018  4:00 PM  PRIMARY CARE PHYSICIAN: Olin Hauser, DO   ADMISSION DIAGNOSIS:  Seizure ([place]) [R56.9] Altered mental status, unspecified altered mental status type [R41.82]  DISCHARGE DIAGNOSIS:  Active Problems:   Epilepsy ([employer])   Syncope   SECONDARY DIAGNOSIS:   Past Medical History:  Diagnosis Date  . Anxiety   . Insomnia   . Mixed hyperlipidemia   . Recurrent major depressive disorder ([place])   . RLS (restless legs syndrome)   . Seizures ([place])   . Tinea pedis of both feet   . Type 2 diabetes mellitus ([place])   . Varicose veins      ADMITTING HISTORY  Chief Complaint: Seizure activity HPI: The patient with past medical history of epilepsy as well as diabetes presents to the emergency department due to her physicians encouragement after missing some of her seizure medication.  The patient's seizure activity is similar to absence seizures.  Laboratory evaluation revealed low valproic acid level.  Due to the patient's continued confusion emergency department staff loaded her with fosphenytoin prior to calling the hospitalist service for admission.    HOSPITAL COURSE:   *Seizures secondary to noncompliance with medications Patient was loaded with IV bolus and later continued on home medications.  No further seizures in the hospital.  Patient seen by neurology.  Appreciated input.  *Vasovagal syncope secondary to dehydration.  IV fluid hydration given.  By the day of discharge she has no further dizziness and ambulating well.  *Type 2 diabetes mellitus.  Well controlled.  Patient stable for discharge home.  CONSULTS OBTAINED:  Treatment Team:  Alexis Goodell, MD  DRUG ALLERGIES:  No Known Allergies  DISCHARGE MEDICATIONS:    Allergies as of 02/04/2018   No Known Allergies     Medication List    STOP taking these medications   lisinopril 2.5 MG tablet Commonly known as:  PRINIVIL,ZESTRIL     TAKE these medications   amoxicillin-clavulanate 875-125 MG tablet Commonly known as:  AUGMENTIN Take 1 tablet by mouth every 12 (twelve) hours.   aspirin 81 MG EC tablet Take 1 tablet (81 mg total) by mouth daily.   atorvastatin 20 MG tablet Commonly known as:  LIPITOR Take 1 tablet (20 mg total) by mouth daily.   blood glucose meter kit and supplies Kit Dispense based on patient and insurance preference. Check blood glucose once daily.   clotrimazole 1 % cream Commonly known as:  LOTRIMIN Apply 1 application topically 2 (two) times daily. Reported on 06/07/2016   divalproex 125 MG DR tablet Commonly known as:  DEPAKOTE Take 1 pill in am and 2 pill at night   escitalopram 20 MG tablet Commonly known as:  LEXAPRO Take 1 tablet (20 mg total) by mouth daily.   gabapentin 400 MG capsule Commonly known as:  NEURONTIN Take 1 capsule (400 mg total) by mouth 2 (two) times daily.   lamoTRIgine 150 MG tablet Commonly known as:  LAMICTAL Take 150 mg by mouth 2 (two) times daily.   meloxicam 15 MG tablet Commonly known as:  MOBIC Take 15 mg by mouth as needed. Reported on 06/07/2016   metFORMIN 500 MG tablet Commonly known as:  GLUCOPHAGE Take 1 tablet (500 mg total) by mouth 2 (two) times  daily.   omeprazole 20 MG capsule Commonly known as:  PRILOSEC Take 1 capsule (20 mg total) by mouth daily. Before 30 min before first meal of day   ondansetron 4 MG disintegrating tablet Commonly known as:  ZOFRAN-ODT Take 1 tablet (4 mg total) by mouth 2 (two) times daily. What changed:    when to take this  reasons to take this   phenytoin 200 MG ER capsule Commonly known as:  DILANTIN Take 200 mg by mouth 2 (two) times daily.   QUEtiapine 25 MG tablet Commonly known as:  SEROQUEL Take 1 tablet (25 mg  total) by mouth 2 (two) times daily after a meal.   Vitamin D-3 1000 units Caps Take 1 capsule by mouth daily.       Today   VITAL SIGNS:  Blood pressure 103/81, pulse 87, temperature 98.1 F (36.7 C), temperature source Oral, resp. rate 16, height _0  (1.651 m), weight 87.1 kg (192 lb 0.3 oz), last menstrual period 05/10/2012, SpO2 94 %.  I/O:  No intake or output data in the 24 hours ending 02/08/18 1446  PHYSICAL EXAMINATION:  Physical Exam  GENERAL:  67 y.o.-year-old patient lying in the bed with no acute distress.  LUNGS: Normal breath sounds bilaterally, no wheezing, rales,rhonchi or crepitation. No use of accessory muscles of respiration.  CARDIOVASCULAR: S1, S2 normal. No murmurs, rubs, or gallops.  ABDOMEN: Soft, non-tender, non-distended. Bowel sounds present. No organomegaly or mass.  NEUROLOGIC: Moves all 4 extremities. PSYCHIATRIC: The patient is alert and oriented x 3.  SKIN: No obvious rash, lesion, or ulcer.   DATA REVIEW:   CBC Recent Labs  Lab 02/04/18 0336  WBC 4.0  HGB 12.9  HCT 38.7  PLT 177    Chemistries  Recent Labs  Lab 02/01/18 1759 02/03/18 0303  NA 141 143  K 4.3 3.5  CL 103 106  CO2 28 32  GLUCOSE 131* 109*  BUN 16 13  CREATININE 0.84 0.77  CALCIUM 9.6 8.7*  AST 19  --   ALT 15  --   ALKPHOS 115  --   BILITOT 0.3  --     Cardiac Enzymes No results for input(s): TROPONINI in the last 168 hours.  Microbiology Results  No results found for this or any previous visit.  RADIOLOGY:  No results found.  Follow up with PCP in 1 week.  Management plans discussed with the patient, family and they are in agreement.  CODE STATUS:  Code Status History    Date Active Date Inactive Code Status Order ID Comments User Context   02/02/2018 03:39 02/04/2018 19:24 Full Code [id number]  Harrie Foreman, MD Inpatient      TOTAL TIME TAKING CARE OF THIS PATIENT ON DAY OF DISCHARGE: more than 30 minutes.   Neita Carp M.D on  02/08/2018 at 2:46 PM  Between 7am to 6pm - Pager - [phone]  After 6pm go to [email or web address] - password [email or web address] [facility]  Office  [phone]  CC: Primary care physician; Olin Hauser, DO  Note: This dictation was prepared with Dragon dictation along with smaller phrase technology. Any transcriptional errors that result from this process are unintentional.

## 2018-02-23 ENCOUNTER — Ambulatory Visit
Admission: RE | Admit: 2018-02-23 | Discharge: 2018-02-23 | Disposition: A | Discharge status: Home / Self Care | Payer: Medicare Other | Source: Ambulatory Visit | Attending: Nurse Practitioner | Admitting: Nurse Practitioner

## 2018-02-23 DIAGNOSIS — Z1231 Encounter for screening mammogram for malignant neoplasm of breast: Secondary | ICD-10-CM

## 2018-02-27 ENCOUNTER — Other Ambulatory Visit: Payer: Self-pay | Admitting: Family Medicine

## 2018-02-27 DIAGNOSIS — R928 Other abnormal and inconclusive findings on diagnostic imaging of breast: Secondary | ICD-10-CM

## 2018-02-27 DIAGNOSIS — N6489 Other specified disorders of breast: Secondary | ICD-10-CM

## 2018-03-06 ENCOUNTER — Ambulatory Visit
Admission: RE | Admit: 2018-03-06 | Discharge: 2018-03-06 | Disposition: A | Discharge status: Home / Self Care | Payer: Medicare Other | Source: Ambulatory Visit | Attending: Family Medicine | Admitting: Family Medicine

## 2018-03-06 DIAGNOSIS — N6489 Other specified disorders of breast: Secondary | ICD-10-CM

## 2018-03-06 DIAGNOSIS — R928 Other abnormal and inconclusive findings on diagnostic imaging of breast: Secondary | ICD-10-CM | POA: Insufficient documentation

## 2018-03-15 ENCOUNTER — Ambulatory Visit (INDEPENDENT_AMBULATORY_CARE_PROVIDER_SITE_OTHER): Payer: Medicare Other | Admitting: Obstetrics and Gynecology

## 2018-03-15 ENCOUNTER — Encounter: Payer: Self-pay | Admitting: Obstetrics and Gynecology

## 2018-03-15 VITALS — BP 124/81 | HR 88 | Ht 65.0 in | Wt 191.4 lb

## 2018-03-15 DIAGNOSIS — N393 Stress incontinence (female) (male): Secondary | ICD-10-CM

## 2018-03-15 DIAGNOSIS — N816 Rectocele: Secondary | ICD-10-CM

## 2018-03-15 LAB — POCT URINALYSIS DIPSTICK
Bilirubin, UA: NEGATIVE
Blood, UA: NEGATIVE
Glucose, UA: NEGATIVE
Ketones, UA: NEGATIVE
NITRITE UA: NEGATIVE
PROTEIN UA: NEGATIVE
Spec Grav, UA: 1.01 (ref 1.010–1.025)
Urobilinogen, UA: 0.2 E.U./dL
pH, UA: 7.5 (ref 5.0–8.0)

## 2018-03-15 NOTE — Progress Notes (Signed)
HPI:      [name] is a 67 y.o. G1P1000 who LMP was Patient's last menstrual period was 05/10/2012.  Subjective:   She presents today referred because of possible bladder prolapse.  This was noted on a recent Pap smear.  Patient complains of daily urine leakage.  Discussion seems to indicate this is more of a stress urine loss than a bladder dyssynergia.  The patient reports no problems with bowel movements. Other than knowing that she has some vaginal changes she is asymptomatic.  Her concern is that something is wrong and might need to be fixed -not necessarily based on symptoms.  She reports that she has had urine loss for many years it has become slightly worse in the last year. Patient is not sexually active and is menopausal with occasional hot flashes. She reports no pelvic pain or discomfort. Patient has a significant seizure disorder and is diabetic.    Hx: The following portions of the patient's history were reviewed and updated as appropriate:             She  has a past medical history of Anxiety, Insomnia, Mixed hyperlipidemia, Recurrent major depressive disorder ([place]), RLS (restless legs syndrome), Seizures ([place]), Tinea pedis of both feet, Type 2 diabetes mellitus ([place]), and Varicose veins. She does not have any pertinent problems on file. She  has a past surgical history that includes none. Her family history includes Cancer in her father; Diabetes in her mother; Heart disease in her mother. She  reports that  has never smoked. she has never used smokeless tobacco. She reports that she does not drink alcohol or use drugs. She has a current medication list which includes the following prescription(s): aspirin, atorvastatin, blood glucose meter kit and supplies, vitamin d-3, clotrimazole, divalproex, escitalopram, gabapentin, lamotrigine, meloxicam, metformin, omeprazole, ondansetron, phenytoin, and quetiapine. She has No Known Allergies.       Review of Systems:   Review of Systems  Constitutional: Denied constitutional symptoms, night sweats, recent illness, fatigue, fever, insomnia and weight loss.  Eyes: Denied eye symptoms, eye pain, photophobia, vision change and visual disturbance.  Ears/Nose/Throat/Neck: Denied ear, nose, throat or neck symptoms, hearing loss, nasal discharge, sinus congestion and sore throat.  Cardiovascular: Denied cardiovascular symptoms, arrhythmia, chest pain/pressure, edema, exercise intolerance, orthopnea and palpitations.  Respiratory: Denied pulmonary symptoms, asthma, pleuritic pain, productive sputum, cough, dyspnea and wheezing.  Gastrointestinal: Denied, gastro-esophageal reflux, melena, nausea and vomiting.  Genitourinary: See HPI for additional information.  Musculoskeletal: Denied musculoskeletal symptoms, stiffness, swelling, muscle weakness and myalgia.  Dermatologic: Denied dermatology symptoms, rash and scar.  Neurologic: Denied neurology symptoms, dizziness, headache, neck pain and syncope.  Psychiatric: Denied psychiatric symptoms, anxiety and depression.  Endocrine: Denied endocrine symptoms including hot flashes and night sweats.   Meds:   Current Outpatient Medications on File Prior to Visit  Medication Sig Dispense Refill  . aspirin EC 81 MG EC tablet Take 1 tablet (81 mg total) by mouth daily. 30 tablet 0  . atorvastatin (LIPITOR) 20 MG tablet Take 1 tablet (20 mg total) by mouth daily. 90 tablet 3  . blood glucose meter kit and supplies KIT Dispense based on patient and insurance preference. Check blood glucose once daily. 1 each 0  . Cholecalciferol (VITAMIN D-3) 1000 UNITS CAPS Take 1 capsule by mouth daily.     . clotrimazole (LOTRIMIN) 1 % cream Apply 1 application topically 2 (two) times daily. Reported on 06/07/2016    . divalproex (DEPAKOTE) 125 MG  DR tablet Take 1 pill in am and 2 pill at night 90 tablet 1  . escitalopram (LEXAPRO) 20 MG tablet Take 1 tablet (20 mg total) by mouth daily. 30  tablet 11  . gabapentin (NEURONTIN) 400 MG capsule Take 1 capsule (400 mg total) by mouth 2 (two) times daily. 180 capsule 3  . lamoTRIgine (LAMICTAL) 150 MG tablet Take 150 mg by mouth 2 (two) times daily.     . meloxicam (MOBIC) 15 MG tablet Take 15 mg by mouth as needed. Reported on 06/07/2016    . metFORMIN (GLUCOPHAGE) 500 MG tablet Take 1 tablet (500 mg total) by mouth 2 (two) times daily. 180 tablet 3  . omeprazole (PRILOSEC) 20 MG capsule Take 1 capsule (20 mg total) by mouth daily. Before 30 min before first meal of day 30 capsule 5  . ondansetron (ZOFRAN-ODT) 4 MG disintegrating tablet Take 1 tablet (4 mg total) by mouth 2 (two) times daily. (Patient taking differently: Take 4 mg by mouth 2 (two) times daily as needed. ) 20 tablet 1  . phenytoin (DILANTIN) 200 MG ER capsule Take 200 mg by mouth 2 (two) times daily.     . QUEtiapine (SEROQUEL) 25 MG tablet Take 1 tablet (25 mg total) by mouth 2 (two) times daily after a meal. 60 tablet 1   No current facility-administered medications on file prior to visit.     Objective:     Vitals:   03/15/18 0914  BP: 124/81  Pulse: 88              Physical examination   Pelvic:   Vulva: Normal appearance.  No lesions.  Vagina:  Vaginal atrophy noted with loss of rugae.  Support:  Excellent anterior support with good uterine support.  Third-degree rectocele present  Urethra No masses tenderness or scarring.  Meatus Normal size without lesions or prolapse.  Cervix: Normal appearance.  No lesions.  Anus: Normal exam.  No lesions.  Perineum: Normal exam.  No lesions.        Bimanual   Uterus: Normal size.  Non-tender.  Mobile.  AV.  Adnexae: No masses.  Non-tender to palpation.  Cul-de-sac: Negative for abnormality.   UA dip not significant for infection.   Assessment:    G1P1000 Patient Active Problem List   Diagnosis Date Noted  . Syncope 02/03/2018  . Epilepsy ([place]) 02/02/2018  . Pain in both upper extremities 09/06/2017  .  Anxiety 07/03/2017  . Onychomycosis of left great toe 02/07/2017  . Allergic rhinitis due to allergen 02/07/2017  . Arthralgia of right temporomandibular joint 02/07/2017  . 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 Recurrent major depressive disorder ([place]) 10/24/2015  . Varicose veins 10/24/2015  . Seizures ([place]) 10/24/2015  . Tinea pedis of both feet 10/24/2015  . Insomnia 10/24/2015  . RLS (restless legs syndrome) 10/24/2015  . Avitaminosis D 11/13/2012  . HLD (hyperlipidemia) 08/14/2012     1. SUI (stress urinary incontinence, female)   2. Rectocele without uterine prolapse     The bulge the patient has noticed is a rectocele.  She reports no problems whatsoever with bowel movements.  She reports no pelvic pain or pressure symptoms.  Rectocele is not related to urine loss.  She has excellent anterior support and excellent uterine support.   Plan:            1.  We have discussed  pelvic prolapse rectocele cystocele and urinary incontinence in detail.  I believe she feels much better about her condition knowing that is a rectocele and that she is asymptomatic.  She continues to experience urine loss and we have discussed this in some detail.  She says that she has been living with it for a while and will probably continue to live with it.  Because of her excellent anterior support she does not need hysterectomy or cystocele repair.  I have advised urology consult should she want further exploration of her urine loss.  Orders Orders Placed This Encounter  Procedures  . POCT urinalysis dipstick    No orders of the defined types were placed in this encounter.     F/U  No Follow-up on file. I spent 35 minutes with this patient of which greater than 50% was spent discussing stress urinary incontinence, pelvic prolapse, workup and treatment for pelvic issues, vaginal atrophy, possible urology  referral.  Finis Bud, M.D. 03/15/2018 11:07 AM

## 2018-03-15 NOTE — Progress Notes (Signed)
Pt had a pap smear last month and noticed her prolapsed bladder. Is unable to control/hold urine.

## 2018-03-23 ENCOUNTER — Encounter: Payer: Self-pay | Admitting: *Deleted

## 2018-04-18 ENCOUNTER — Ambulatory Visit (INDEPENDENT_AMBULATORY_CARE_PROVIDER_SITE_OTHER): Payer: Medicare Other | Admitting: General Surgery

## 2018-04-18 ENCOUNTER — Encounter: Payer: Self-pay | Admitting: General Surgery

## 2018-04-18 VITALS — BP 140/70 | HR 93 | Resp 16 | Ht 65.0 in | Wt 199.0 lb

## 2018-04-18 DIAGNOSIS — K8020 Calculus of gallbladder without cholecystitis without obstruction: Secondary | ICD-10-CM

## 2018-04-18 NOTE — Progress Notes (Signed)
Patient ID: Kaylee Wolf, female   DOB: Feb 05, 1951, 67 y.o.   MRN: [id number]  Chief Complaint  Patient presents with  . Other    gall stones    HPI Kaylee Wolf is a 67 y.o. female.  Here for evaluation of gallstones referred by Threasa Alpha NP. She was having lower abdominal pain for about 1 week several months ago. She was hurting in the lower left and right abdomen. She denies any pain at this time and does not have any detailed recollection of the pain. She states they were doing an ultrasound in the office and saw gall stones on 02-27-18.  The patient denies any dietary intolerance.  Diabetes for about five years. No active patient involvement in monitoring.   She is currently on Amoxil 500 mg for ear infection. She is here with her friend, Raquel Sarna and his daughter.  HPI  Past Medical History:  Diagnosis Date  . Anxiety   . Insomnia   . Mixed hyperlipidemia   . Recurrent major depressive disorder ([place])   . RLS (restless legs syndrome)   . Seizures ([place])   . Tinea pedis of both feet   . Type 2 diabetes mellitus ([place]) 2014?  [name] Varicose veins     Past Surgical History:  Procedure Laterality Date  . none      Family History  Problem Relation Age of Onset  . Diabetes Mother   . Heart disease Mother   . Cancer Father   . Breast cancer Neg Hx     Social History Social History   Tobacco Use  . Smoking status: Never Smoker  . Smokeless tobacco: Never Used  Substance Use Topics  . Alcohol use: No    Alcohol/week: 0.0 oz  . Drug use: No    No Known Allergies  Current Outpatient Medications  Medication Sig Dispense Refill  . amoxicillin (AMOXIL) 500 MG tablet Take 500 mg by mouth 2 (two) times daily.    [name] aspirin EC 81 MG EC tablet Take 1 tablet (81 mg total) by mouth daily. 30 tablet 0  . atorvastatin (LIPITOR) 20 MG tablet Take 1 tablet (20 mg total) by mouth daily. 90 tablet 3  . Cholecalciferol (VITAMIN D-3) 1000 UNITS CAPS Take 1 capsule by mouth  daily.     . divalproex (DEPAKOTE) 125 MG DR tablet Take 1 pill in am and 2 pill at night 90 tablet 1  . escitalopram (LEXAPRO) 20 MG tablet Take 1 tablet (20 mg total) by mouth daily. 30 tablet 11  . gabapentin (NEURONTIN) 400 MG capsule Take 1 capsule (400 mg total) by mouth 2 (two) times daily. 180 capsule 3  . lamoTRIgine (LAMICTAL) 150 MG tablet Take 150 mg by mouth 2 (two) times daily.     . metFORMIN (GLUCOPHAGE) 500 MG tablet Take 1 tablet (500 mg total) by mouth 2 (two) times daily. 180 tablet 3  . Multiple Vitamin (MULTIVITAMIN) tablet Take 1 tablet by mouth daily.    [name] omeprazole (PRILOSEC) 20 MG capsule Take 1 capsule (20 mg total) by mouth daily. Before 30 min before first meal of day 30 capsule 5  . phenytoin (DILANTIN) 200 MG ER capsule Take 200 mg by mouth 2 (two) times daily.     . blood glucose meter kit and supplies KIT Dispense based on patient and insurance preference. Check blood glucose once daily. (Patient not taking: Reported on 04/18/2018) 1 each 0  . ondansetron (ZOFRAN-ODT) 4 MG disintegrating tablet Take 1  tablet (4 mg total) by mouth 2 (two) times daily. (Patient not taking: Reported on 04/18/2018) 20 tablet 1   No current facility-administered medications for this visit.     Review of Systems Review of Systems  Constitutional: Negative.   Respiratory: Positive for cough.   Cardiovascular: Negative.     Blood pressure 140/70, pulse 93, resp. rate 16, height _0  (1.651 m), weight 199 lb (90.3 kg), last menstrual period 05/10/2012, SpO2 92 %.  Physical Exam Physical Exam  Constitutional: She is oriented to person, place, and time. She appears well-developed and well-nourished.  HENT:  Mouth/Throat: Oropharynx is clear and moist.  Eyes: Conjunctivae are normal. No scleral icterus.  Neck: Neck supple.  Cardiovascular: Normal rate, regular rhythm and intact distal pulses.  Murmur heard.  Systolic murmur is present with a grade of 1/6. Pulses:      Femoral  pulses are 2+ on the right side, and 2+ on the left side.      Popliteal pulses are 2+ on the right side, and 2+ on the left side.       Dorsalis pedis pulses are 2+ on the right side, and 2+ on the left side.  No lower leg edema   Pulmonary/Chest: Effort normal.  Abdominal: Soft. Normal appearance and bowel sounds are normal. There is no hepatosplenomegaly. There is no tenderness. A hernia is present.    Umbilical hernia   Lymphadenopathy:    She has no cervical adenopathy.  Neurological: She is alert and oriented to person, place, and time.  Skin: Skin is warm and dry.  Psychiatric: Her behavior is normal.    Data Reviewed March 23, 2018 PCP notes.  February 27, 2018 abdominal ultrasound completed in her PCP office.  Cholelithiasis.  Normal common bile duct.    Assessment    Asymptomatic cholelithiasis.    Plan    Follow up as needed. The patient is aware to call back for any questions or new concerns.      HPI, Physical Exam, Assessment and Plan have been scribed under the direction and in the presence of Robert Bellow, MD. Karie Fetch, RN  I have completed the exam and reviewed the above documentation for accuracy and completeness.  I agree with the above.  Haematologist has been used and any errors in dictation or transcription are unintentional.  Hervey Ard, M.D., F.A.C.S.  Forest Gleason Keylani Perlstein 04/18/2018, 9:09 PM

## 2018-04-18 NOTE — Patient Instructions (Addendum)
The patient is aware to call back for any questions or new  Cholelithiasis Cholelithiasis is also called "gallstones." It is a kind of gallbladder disease. The gallbladder is an organ that stores a liquid (bile) that helps you digest fat. Gallstones may not cause symptoms (may be silent gallstones) until they cause a blockage, and then they can cause pain (gallbladder attack). Follow these instructions at home:  Take over-the-counter and prescription medicines only as told by your doctor.  Stay at a healthy weight.  Eat healthy foods. This includes: ? Eating fewer fatty foods, like fried foods. ? Eating fewer refined carbs (refined carbohydrates). Refined carbs are breads and grains that are highly processed, like white bread and white rice. Instead, choose whole grains like whole-wheat bread and brown rice. ? Eating more fiber. Almonds, fresh fruit, and beans are healthy sources of fiber.  Keep all follow-up visits as told by your doctor. This is important. Contact a doctor if:  You have sudden pain in the upper right side of your belly (abdomen). Pain might spread to your right shoulder or your chest. This may be a sign of a gallbladder attack.  You feel sick to your stomach (are nauseous).  You throw up (vomit).  You have been diagnosed with gallstones that have no symptoms and you get: ? Belly pain. ? Discomfort, burning, or fullness in the upper part of your belly (indigestion). Get help right away if:  You have sudden pain in the upper right side of your belly, and it lasts for more than 2 hours.  You have belly pain that lasts for more than 5 hours.  You have a fever or chills.  You keep feeling sick to your stomach or you keep throwing up.  Your skin or the whites of your eyes turn yellow (jaundice).  You have dark-colored pee (urine).  You have light-colored poop (stool). Summary  Cholelithiasis is also called "gallstones."  The gallbladder is an organ that  stores a liquid (bile) that helps you digest fat.  Silent gallstones are gallstones that do not cause symptoms.  A gallbladder attack may cause sudden pain in the upper right side of your belly. Pain might spread to your right shoulder or your chest. If this happens, contact your doctor.  If you have sudden pain in the upper right side of your belly that lasts for more than 2 hours, get help right away. This information is not intended to replace advice given to you by your health care provider. Make sure you discuss any questions you have with your health care provider. Document Released: 05/31/2008 Document Revised: 08/29/2016 Document Reviewed: 08/29/2016 Elsevier Interactive Patient Education  2017 [employer]. concerns.

## 2018-06-13 ENCOUNTER — Other Ambulatory Visit: Payer: Self-pay | Admitting: Family Medicine

## 2018-06-13 DIAGNOSIS — R110 Nausea: Secondary | ICD-10-CM

## 2018-07-10 ENCOUNTER — Encounter: Payer: Self-pay | Admitting: Family Medicine

## 2018-09-09 ENCOUNTER — Other Ambulatory Visit: Payer: Self-pay

## 2018-09-09 ENCOUNTER — Emergency Department
Admission: EM | Admit: 2018-09-09 | Discharge: 2018-09-10 | Disposition: A | Discharge status: Home / Self Care | Payer: Medicare HMO | Attending: Emergency Medicine | Admitting: Emergency Medicine

## 2018-09-09 DIAGNOSIS — Z7982 Long term (current) use of aspirin: Secondary | ICD-10-CM | POA: Insufficient documentation

## 2018-09-09 DIAGNOSIS — Z7984 Long term (current) use of oral hypoglycemic drugs: Secondary | ICD-10-CM | POA: Insufficient documentation

## 2018-09-09 DIAGNOSIS — L539 Erythematous condition, unspecified: Secondary | ICD-10-CM | POA: Diagnosis present

## 2018-09-09 DIAGNOSIS — E119 Type 2 diabetes mellitus without complications: Secondary | ICD-10-CM | POA: Diagnosis not present

## 2018-09-09 DIAGNOSIS — Z79899 Other long term (current) drug therapy: Secondary | ICD-10-CM | POA: Diagnosis not present

## 2018-09-09 DIAGNOSIS — L03032 Cellulitis of left toe: Secondary | ICD-10-CM | POA: Diagnosis not present

## 2018-09-09 DIAGNOSIS — E1142 Type 2 diabetes mellitus with diabetic polyneuropathy: Secondary | ICD-10-CM | POA: Insufficient documentation

## 2018-09-09 LAB — CBC WITH DIFFERENTIAL/PLATELET
BASOS ABS: 0 10*3/uL (ref 0–0.1)
Basophils Relative: 0 %
EOS ABS: 0.1 10*3/uL (ref 0–0.7)
Eosinophils Relative: 3 %
HCT: 38.7 % (ref 35.0–47.0)
HEMOGLOBIN: 13.1 g/dL (ref 12.0–16.0)
LYMPHS ABS: 1.2 10*3/uL (ref 1.0–3.6)
LYMPHS PCT: 26 %
MCH: 30.3 pg (ref 26.0–34.0)
MCHC: 33.8 g/dL (ref 32.0–36.0)
MCV: 89.6 fL (ref 80.0–100.0)
Monocytes Absolute: 0.5 10*3/uL (ref 0.2–0.9)
Monocytes Relative: 11 %
NEUTROS PCT: 60 %
Neutro Abs: 2.9 10*3/uL (ref 1.4–6.5)
Platelets: 188 10*3/uL (ref 150–440)
RBC: 4.32 MIL/uL (ref 3.80–5.20)
RDW: 15.9 % — ABNORMAL HIGH (ref 11.5–14.5)
WBC: 4.7 10*3/uL (ref 3.6–11.0)

## 2018-09-09 LAB — COMPREHENSIVE METABOLIC PANEL
ALBUMIN: 4.2 g/dL (ref 3.5–5.0)
ALK PHOS: 105 U/L (ref 38–126)
ALT: 13 U/L (ref 0–44)
ANION GAP: 8 (ref 5–15)
AST: 22 U/L (ref 15–41)
BILIRUBIN TOTAL: 0.3 mg/dL (ref 0.3–1.2)
BUN: 15 mg/dL (ref 8–23)
CALCIUM: 8.9 mg/dL (ref 8.9–10.3)
CO2: 30 mmol/L (ref 22–32)
Chloride: 103 mmol/L (ref 98–111)
Creatinine, Ser: 0.93 mg/dL (ref 0.44–1.00)
GFR calc Af Amer: >60 mL/min (ref >60)
GFR calc non Af Amer: >60 mL/min (ref >60)
GLUCOSE: 107 mg/dL — AB (ref 70–99)
Potassium: 3.8 mmol/L (ref 3.5–5.1)
Sodium: 141 mmol/L (ref 135–145)
TOTAL PROTEIN: 7 g/dL (ref 6.5–8.1)

## 2018-09-09 LAB — LACTIC ACID, PLASMA: Lactic Acid, Venous: 1.1 mmol/L (ref 0.5–1.9)

## 2018-09-09 MED ORDER — AMOXICILLIN-POT CLAVULANATE 875-125 MG PO TABS: 1.0000 | ORAL_TABLET | Freq: Two times a day (BID) | ORAL | 0 refills | Status: AC

## 2018-09-09 NOTE — ED Provider Notes (Signed)
[facility] Emergency Department Provider Note  ____________________________________________   First MD Initiated Contact with Patient 09/09/18 2311     (approximate)  I have reviewed the triage vital signs and the nursing notes.   HISTORY  Chief Complaint Joint Swelling   HPI Kaylee Wolf is a 67 y.o. female who comes to the emergency department with 1 day of erythema to left great toe.  She says she is had "cellulitis in both of my legs" for the past several weeks and has completed multiple rounds of antibiotics.  She was recently begun on Levaquin as the cellulitis was not responding to previous antibiotics.  She comes today because she has deep redness at the base of her left great toe.   No fevers or chills.  She does have a history of "leg swelling" although no known history of CHF.  She can lie completely flat.  Sleeps on one pillow or no pillows.  She has gained a little bit of weight unintentionally.  No shortness of breath.  She does take "a water pill".  Her symptoms have been insidious onset now are constant seem to be somewhat worse with walking and somewhat improved when not.  She did begin using compression stockings which did seem to help somewhat.   Past Medical History:  Diagnosis Date  . Anxiety   . Insomnia   . Mixed hyperlipidemia   . Recurrent major depressive disorder ([place])   . RLS (restless legs syndrome)   . Seizures ([place])   . Tinea pedis of both feet   . Type 2 diabetes mellitus ([place]) 2014?  [name] Varicose veins     Patient Active Problem List   Diagnosis Date Noted  . Gall stones 04/18/2018  . Syncope 02/03/2018  . Epilepsy ([place]) 02/02/2018  . Pain in both upper extremities 09/06/2017  . Anxiety 07/03/2017  . Onychomycosis of left great toe 02/07/2017  . Allergic rhinitis due to allergen 02/07/2017  . Arthralgia of right temporomandibular joint 02/07/2017  . 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 Recurrent major depressive disorder ([place]) 10/24/2015  . Varicose veins 10/24/2015  . Seizures ([place]) 10/24/2015  . Tinea pedis of both feet 10/24/2015  . Insomnia 10/24/2015  . RLS (restless legs syndrome) 10/24/2015  . Avitaminosis D 11/13/2012  . HLD (hyperlipidemia) 08/14/2012    Past Surgical History:  Procedure Laterality Date  . none      Prior to Admission medications   Medication Sig Start Date End Date Taking? Authorizing Provider  amoxicillin-clavulanate (AUGMENTIN) 875-125 MG tablet Take 1 tablet by mouth 2 (two) times daily for 7 days. 09/09/18 09/16/18  Darel Hong, MD  aspirin EC 81 MG EC tablet Take 1 tablet (81 mg total) by mouth daily. 02/03/18   Loletha Grayer, MD  atorvastatin (LIPITOR) 20 MG tablet Take 1 tablet (20 mg total) by mouth daily. 09/06/17   Karamalegos, Devonne Doughty, DO  blood glucose meter kit and supplies KIT Dispense based on patient and insurance preference. Check blood glucose once daily. Patient not taking: Reported on 04/18/2018 11/25/15   Luciana Axe, NP  Cholecalciferol (VITAMIN D-3) 1000 UNITS CAPS Take 1 capsule by mouth daily.     [provider]  divalproex (DEPAKOTE) 125 MG DR tablet Take 1 pill in am and 2 pill at night 02/21/17   Rainey Pines, MD  escitalopram (LEXAPRO) 20 MG tablet Take 1  tablet (20 mg total) by mouth daily. 09/06/17   Karamalegos, Devonne Doughty, DO  gabapentin (NEURONTIN) 400 MG capsule Take 1 capsule (400 mg total) by mouth 2 (two) times daily. 09/06/17   Karamalegos, Devonne Doughty, DO  lamoTRIgine (LAMICTAL) 150 MG tablet Take 150 mg by mouth 2 (two) times daily.  07/20/16   [provider]  metFORMIN (GLUCOPHAGE) 500 MG tablet Take 1 tablet (500 mg total) by mouth 2 (two) times daily. 09/06/17   Karamalegos, Devonne Doughty, DO  Multiple Vitamin (MULTIVITAMIN) tablet Take 1 tablet by mouth daily.    [provider]  omeprazole  (PRILOSEC) 20 MG capsule Take 1 capsule (20 mg total) by mouth daily. Before 30 min before first meal of day 09/06/17   Olin Hauser, DO  ondansetron (ZOFRAN-ODT) 4 MG disintegrating tablet Take 1 tablet (4 mg total) by mouth 2 (two) times daily. Patient not taking: Reported on 04/18/2018 09/06/17   Olin Hauser, DO  phenytoin (DILANTIN) 200 MG ER capsule Take 200 mg by mouth 2 (two) times daily.  11/17/15   [provider]    Allergies Patient has no known allergies.  Family History  Problem Relation Age of Onset  . Diabetes Mother   . Heart disease Mother   . Cancer Father   . Breast cancer Neg Hx     Social History Social History   Tobacco Use  . Smoking status: Never Smoker  . Smokeless tobacco: Never Used  Substance Use Topics  . Alcohol use: No    Alcohol/week: 0.0 standard drinks  . Drug use: No    Review of Systems Constitutional: No fever/chills Eyes: No visual changes. ENT: No sore throat. Cardiovascular: Denies chest pain. Respiratory: Denies shortness of breath. Gastrointestinal: No abdominal pain.  No nausea, no vomiting.  No diarrhea.  No constipation. Genitourinary: Negative for dysuria. Musculoskeletal: Negative for back pain. Skin: Positive for rash Neurological: Negative for headaches, focal weakness or numbness.   ____________________________________________   PHYSICAL EXAM:  VITAL SIGNS: ED Triage Vitals  Enc Vitals Group     BP 09/09/18 2028 (!) 143/76     Pulse Rate 09/09/18 2027 (!) 102     Resp 09/09/18 2027 16     Temp 09/09/18 2027 98.8 F (37.1 C)     Temp Source 09/09/18 2027 Oral     SpO2 09/09/18 2027 96 %     Weight 09/09/18 2028 190 lb (86.2 kg)     Height 09/09/18 2028 '5\' 5"'  (1.651 m)     Head Circumference --      Peak Flow --      Pain Score 09/09/18 2027 0     Pain Loc --      Pain Edu? --      Excl. in [place]? --     Constitutional: Alert and oriented x4 joking laughing pleasant  cooperative speaks in full clear sentences Eyes: PERRL EOMI. Head: Atraumatic. Nose: No congestion/rhinnorhea. Mouth/Throat: No trismus Neck: No stridor.  Able to lie completely flat with no JVD Cardiovascular: Normal rate, regular rhythm. Grossly normal heart sounds.  Good peripheral circulation. Respiratory: Normal respiratory effort.  No retractions. Lungs CTAB and moving good air Gastrointestinal: Obese soft nontender Musculoskeletal: 2+ pitting edema to knees bilaterally legs are equal in size Neurologic:  Normal speech and language. No gross focal neurologic deficits are appreciated. Skin: Faint erythematous mid shin bilaterally but not warm no bulla blister sloughing or other signs of necrotizing soft tissue infection.  She  does have roughly 2 cm area of deep redness at the base of her left great toe Psychiatric: Mood and affect are normal. Speech and behavior are normal.    ____________________________________________   DIFFERENTIAL includes but not limited to  Paronychia, cellulitis, necrotizing soft tissue infection, lymphedema, congestive heart failure ____________________________________________   LABS (all labs ordered are listed, but only abnormal results are displayed)  Labs Reviewed  COMPREHENSIVE METABOLIC PANEL - Abnormal; Notable for the following components:      Result Value   Glucose, Bld 107 (*)    All other components within normal limits  CBC WITH DIFFERENTIAL/PLATELET - Abnormal; Notable for the following components:   RDW 15.9 (*)    All other components within normal limits  LACTIC ACID, PLASMA    Lab work reviewed by me with no acute disease __________________________________________  EKG   ____________________________________________  RADIOLOGY   ____________________________________________   PROCEDURES  Procedure(s) performed: no  Procedures  Critical Care performed: no  ____________________________________________   INITIAL  IMPRESSION / ASSESSMENT AND PLAN / ED COURSE  Pertinent labs & imaging results that were available during my care of the patient were reviewed by me and considered in my medical decision making (see chart for details).   As part of my medical decision making, I reviewed the following data within the [place] History obtained from family if available, nursing notes, old chart and ekg, as well as notes from prior ED visits.  The patient's bilateral lower extremity swelling and erythema do not appear to be cellulitis.  She does appear to have an acute paronychia which I was able to express using iris scissors expressing a small purulent material.  I advised the patient to stop taking Levaquin as it could interact negatively with her multiple antiepileptic medications and will prescribe her instead amoxicillin.  I think she would benefit possibly from a echocardiogram however right now she is euvolemic and does not require inpatient admission.  I will refer her back to primary care.      ____________________________________________   FINAL CLINICAL IMPRESSION(S) / ED DIAGNOSES  Final diagnoses:  Paronychia of fifth toe of left foot      NEW MEDICATIONS STARTED DURING THIS VISIT:  Discharge Medication List as of 09/09/2018 11:48 PM    START taking these medications   Details  amoxicillin-clavulanate (AUGMENTIN) 875-125 MG tablet Take 1 tablet by mouth 2 (two) times daily for 7 days., Starting Sat 09/09/2018, Until Sat 09/16/2018, Print         Note:  This document was prepared using Dragon voice recognition software and may include unintentional dictation errors.     Darel Hong, MD 09/10/18 2308

## 2018-09-09 NOTE — ED Triage Notes (Signed)
Pt complains of bilateral leg swelling and a red toe. Pt states began this week. Pt saw pmd this week and was prescribed levaquin for leg cellulitis. Pt denies pain. Pt denies fever, chills.

## 2018-09-09 NOTE — ED Notes (Signed)
ED Provider at bedside. 

## 2018-09-09 NOTE — ED Notes (Signed)
Patient started on 750 mg Levaquin PO on 9/9 for cellulitis of bilateral legs by PCP. Patient reports worsening symptoms since onset of antibiotics.

## 2018-09-09 NOTE — ED Notes (Signed)
One set of blood cultures obtained in triage.

## 2018-09-09 NOTE — Discharge Instructions (Signed)
STOP TAKING LEVAQUIN.  THIS IS A VERY GOOD ANTIBIOTIC, BUT IT CAN INTERFERE WITH YOUR SEIZURE MEDICATIONS AND MAKE YOU QUITE SICK.  Please instead take augmentin twice a day for one week and keep your foot clean and dry.  I recommend compression stockings to help with your edema and follow up with your PMD within 1 week for a recheck.  It was a pleasure to take care of you today, and thank you for coming to our emergency department.  If you have any questions or concerns before leaving please ask the nurse to grab me and I'm more than happy to go through your aftercare instructions again.  If you were prescribed any opioid pain medication today such as Norco, Vicodin, Percocet, morphine, hydrocodone, or oxycodone please make sure you do not drive when you are taking this medication as it can alter your ability to drive safely.  If you have any concerns once you are home that you are not improving or are in fact getting worse before you can make it to your follow-up appointment, please do not hesitate to call [phone] and come back for further evaluation.  Darel Hong, MD  Results for orders placed or performed during the hospital encounter of 09/09/18  Lactic acid, plasma  Result Value Ref Range   Lactic Acid, Venous 1.1 0.5 - 1.9 mmol/L  Comprehensive metabolic panel  Result Value Ref Range   Sodium 141 135 - 145 mmol/L   Potassium 3.8 3.5 - 5.1 mmol/L   Chloride 103 98 - 111 mmol/L   CO2 30 22 - 32 mmol/L   Glucose, Bld 107 (H) 70 - 99 mg/dL   BUN 15 8 - 23 mg/dL   Creatinine, Ser 0.93 0.44 - 1.00 mg/dL   Calcium 8.9 8.9 - 10.3 mg/dL   Total Protein 7.0 6.5 - 8.1 g/dL   Albumin 4.2 3.5 - 5.0 g/dL   AST 22 15 - 41 U/L   ALT 13 0 - 44 U/L   Alkaline Phosphatase 105 38 - 126 U/L   Total Bilirubin 0.3 0.3 - 1.2 mg/dL   GFR calc non Af Amer >60 >60 mL/min   GFR calc Af Amer >60 >60 mL/min   Anion gap 8 5 - 15  CBC with Differential  Result Value Ref Range   WBC 4.7 3.6 - 11.0 K/uL   RBC 4.32 3.80 - 5.20 MIL/uL   Hemoglobin 13.1 12.0 - 16.0 g/dL   HCT 38.7 35.0 - 47.0 %   MCV 89.6 80.0 - 100.0 fL   MCH 30.3 26.0 - 34.0 pg   MCHC 33.8 32.0 - 36.0 g/dL   RDW 15.9 (H) 11.5 - 14.5 %   Platelets 188 150 - 440 K/uL   Neutrophils Relative % 60 %   Neutro Abs 2.9 1.4 - 6.5 K/uL   Lymphocytes Relative 26 %   Lymphs Abs 1.2 1.0 - 3.6 K/uL   Monocytes Relative 11 %   Monocytes Absolute 0.5 0.2 - 0.9 K/uL   Eosinophils Relative 3 %   Eosinophils Absolute 0.1 0 - 0.7 K/uL   Basophils Relative 0 %   Basophils Absolute 0.0 0 - 0.1 K/uL   No results found.

## 2018-09-10 NOTE — ED Notes (Signed)
Reviewed discharge instructions, follow-up care, and prescriptions with patient. Patient verbalized understanding of all information reviewed. Patient stable, with no distress noted at this time.    

## 2018-09-24 ENCOUNTER — Emergency Department: Payer: Medicare HMO

## 2018-09-24 ENCOUNTER — Emergency Department
Admission: EM | Admit: 2018-09-24 | Discharge: 2018-09-24 | Disposition: A | Discharge status: Home / Self Care | Payer: Medicare HMO | Attending: Emergency Medicine | Admitting: Emergency Medicine

## 2018-09-24 ENCOUNTER — Encounter: Payer: Self-pay | Admitting: Emergency Medicine

## 2018-09-24 ENCOUNTER — Other Ambulatory Visit: Payer: Self-pay

## 2018-09-24 DIAGNOSIS — W010XXA Fall on same level from slipping, tripping and stumbling without subsequent striking against object, initial encounter: Secondary | ICD-10-CM | POA: Insufficient documentation

## 2018-09-24 DIAGNOSIS — W19XXXA Unspecified fall, initial encounter: Secondary | ICD-10-CM

## 2018-09-24 DIAGNOSIS — Z7984 Long term (current) use of oral hypoglycemic drugs: Secondary | ICD-10-CM | POA: Insufficient documentation

## 2018-09-24 DIAGNOSIS — Y92007 Garden or yard of unspecified non-institutional (private) residence as the place of occurrence of the external cause: Secondary | ICD-10-CM | POA: Insufficient documentation

## 2018-09-24 DIAGNOSIS — S5002XA Contusion of left elbow, initial encounter: Secondary | ICD-10-CM

## 2018-09-24 DIAGNOSIS — Z79899 Other long term (current) drug therapy: Secondary | ICD-10-CM | POA: Diagnosis not present

## 2018-09-24 DIAGNOSIS — Z7982 Long term (current) use of aspirin: Secondary | ICD-10-CM | POA: Diagnosis not present

## 2018-09-24 DIAGNOSIS — S59902A Unspecified injury of left elbow, initial encounter: Secondary | ICD-10-CM | POA: Diagnosis present

## 2018-09-24 DIAGNOSIS — Y9301 Activity, walking, marching and hiking: Secondary | ICD-10-CM | POA: Diagnosis not present

## 2018-09-24 DIAGNOSIS — Y998 Other external cause status: Secondary | ICD-10-CM | POA: Insufficient documentation

## 2018-09-24 DIAGNOSIS — E782 Mixed hyperlipidemia: Secondary | ICD-10-CM | POA: Insufficient documentation

## 2018-09-24 DIAGNOSIS — E119 Type 2 diabetes mellitus without complications: Secondary | ICD-10-CM | POA: Insufficient documentation

## 2018-09-24 MED ORDER — HYDROCODONE-ACETAMINOPHEN 5-325 MG PO TABS: 1.0000 | ORAL_TABLET | Freq: Four times a day (QID) | ORAL | 0 refills | Status: DC | PRN

## 2018-09-24 MED ORDER — HYDROCODONE-ACETAMINOPHEN 5-325 MG PO TABS
1.0000 | ORAL_TABLET | Freq: Once | ORAL | Status: AC
Start: 2018-09-24 — End: 2018-09-24
  Administered 2018-09-24: 1 via ORAL
  Filled 2018-09-24: qty 1

## 2018-09-24 NOTE — ED Notes (Signed)
Patient transported to X-ray 

## 2018-09-24 NOTE — Discharge Instructions (Signed)
Follow-up with your primary care provider if any continued problems.  Ice and elevate as needed to reduce swelling.  Take pain medication only as directed.  This medication could cause drowsiness and increase your risk for falling.  Wear sling for support.  Discontinue wearing it after 2 days to prevent stiffness.  If any continued problems she should follow-up with your

## 2018-09-24 NOTE — ED Triage Notes (Signed)
Tripped and fell walking in yard landing on left arm.  Pain mostly in elbow and proximal forearm.  No deformity.  Ambulatory. VSS

## 2018-09-24 NOTE — ED Provider Notes (Signed)
[facility] Emergency Department Provider Note   ____________________________________________   First MD Initiated Contact with Patient 09/24/18 1743     (approximate)  I have reviewed the triage vital signs and the nursing notes.   HISTORY  Chief Complaint Arm Pain    HPI Kaylee Wolf is a 67 y.o. female since to the ED with complaint of left elbow pain after she tripped and fell while walking in the yard.  Patient denies any previous injury to her elbow.  She denies any head injury or loss of consciousness during her fall.  She has been ambulatory since her accident.  Rates her pain as an 8 out of 10.  Past Medical History:  Diagnosis Date  . Anxiety   . Insomnia   . Mixed hyperlipidemia   . Recurrent major depressive disorder ([place])   . RLS (restless legs syndrome)   . Seizures ([place])   . Tinea pedis of both feet   . Type 2 diabetes mellitus ([place]) 2014?  [name] Varicose veins     Patient Active Problem List   Diagnosis Date Noted  . Gall stones 04/18/2018  . Syncope 02/03/2018  . Epilepsy ([place]) 02/02/2018  . Pain in both upper extremities 09/06/2017  . Anxiety 07/03/2017  . Onychomycosis of left great toe 02/07/2017  . Allergic rhinitis due to allergen 02/07/2017  . Arthralgia of right temporomandibular joint 02/07/2017  . 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 Type 2 DM with diabetic neuropathy affecting both sides of body ([place]) 10/24/2015  . Recurrent major depressive disorder ([place]) 10/24/2015  . Varicose veins 10/24/2015  . Seizures ([place]) 10/24/2015  . Tinea pedis of both feet 10/24/2015  . Insomnia 10/24/2015  . RLS (restless legs syndrome) 10/24/2015  . Avitaminosis D 11/13/2012  . HLD (hyperlipidemia) 08/14/2012    Past Surgical History:  Procedure Laterality Date  . none      Prior to Admission medications   Medication Sig Start Date End Date Taking? Authorizing Provider  aspirin EC 81 MG EC tablet Take 1  tablet (81 mg total) by mouth daily. 02/03/18   Loletha Grayer, MD  atorvastatin (LIPITOR) 20 MG tablet Take 1 tablet (20 mg total) by mouth daily. 09/06/17   Karamalegos, Devonne Doughty, DO  blood glucose meter kit and supplies KIT Dispense based on patient and insurance preference. Check blood glucose once daily. Patient not taking: Reported on 04/18/2018 11/25/15   Luciana Axe, NP  Cholecalciferol (VITAMIN D-3) 1000 UNITS CAPS Take 1 capsule by mouth daily.     [provider]  divalproex (DEPAKOTE) 125 MG DR tablet Take 1 pill in am and 2 pill at night 02/21/17   Rainey Pines, MD  escitalopram (LEXAPRO) 20 MG tablet Take 1 tablet (20 mg total) by mouth daily. 09/06/17   Karamalegos, Devonne Doughty, DO  gabapentin (NEURONTIN) 400 MG capsule Take 1 capsule (400 mg total) by mouth 2 (two) times daily. 09/06/17   Karamalegos, Devonne Doughty, DO  HYDROcodone-acetaminophen (NORCO/VICODIN) 5-325 MG tablet Take 1 tablet by mouth every 6 (six) hours as needed for moderate pain. 09/24/18   Johnn Hai, PA-C  lamoTRIgine (LAMICTAL) 150 MG tablet Take 150 mg by mouth 2 (two) times daily.  07/20/16   [provider]  metFORMIN (GLUCOPHAGE) 500 MG tablet Take 1 tablet (500 mg total) by mouth 2 (two) times daily. 09/06/17   Karamalegos, Devonne Doughty, DO  Multiple Vitamin (MULTIVITAMIN) tablet Take 1 tablet by mouth daily.  [provider]  omeprazole (PRILOSEC) 20 MG capsule Take 1 capsule (20 mg total) by mouth daily. Before 30 min before first meal of day 09/06/17   Olin Hauser, DO  ondansetron (ZOFRAN-ODT) 4 MG disintegrating tablet Take 1 tablet (4 mg total) by mouth 2 (two) times daily. Patient not taking: Reported on 04/18/2018 09/06/17   Olin Hauser, DO  phenytoin (DILANTIN) 200 MG ER capsule Take 200 mg by mouth 2 (two) times daily.  11/17/15   [provider]    Allergies Patient has no known allergies.  Family History  Problem Relation Age  of Onset  . Diabetes Mother   . Heart disease Mother   . Cancer Father   . Breast cancer Neg Hx     Social History Social History   Tobacco Use  . Smoking status: Never Smoker  . Smokeless tobacco: Never Used  Substance Use Topics  . Alcohol use: No    Alcohol/week: 0.0 standard drinks  . Drug use: No    Review of Systems Constitutional: No fever/chills Eyes: No visual changes. ENT: No trauma. Cardiovascular: Denies chest pain. Respiratory: Denies shortness of breath. Musculoskeletal: Negative for cervical or back tenderness.  Positive for left elbow pain. Skin: Negative for rash. Neurological: Negative for headaches, focal weakness or numbness. ____________________________________________   PHYSICAL EXAM:  VITAL SIGNS: ED Triage Vitals  Enc Vitals Group     BP 09/24/18 1707 139/72     Pulse Rate 09/24/18 1707 85     Resp 09/24/18 1707 12     Temp 09/24/18 1707 98 F (36.7 C)     Temp Source 09/24/18 1707 Oral     SpO2 09/24/18 1707 94 %     Weight 09/24/18 1710 189 lb 13.1 oz (86.1 kg)     Height 09/24/18 1710 '5\' 5"'  (1.651 m)     Head Circumference --      Peak Flow --      Pain Score 09/24/18 1709 8     Pain Loc --      Pain Edu? --      Excl. in [place]? --    Constitutional: Alert and oriented. Well appearing and in no acute distress. Eyes: Conjunctivae are normal. PERRL. EOMI. Head: Atraumatic. Nose: No trauma. Neck: No stridor.  No tenderness to palpation cervical spine posteriorly. Cardiovascular: Normal rate, regular rhythm. Grossly normal heart sounds.  Good peripheral circulation. Respiratory: Normal respiratory effort.  No retractions. Lungs CTAB. Gastrointestinal: Soft and nontender. No distention.  Musculoskeletal: On examination of the left upper extremity there is moderate tenderness on palpation of the posterior elbow.  Range of motion is restricted and patient guards against movement.  Soft tissue swelling is present.  Skin is intact.  Pulse  present. Neurologic:  Normal speech and language. No gross focal neurologic deficits are appreciated.  Skin:  Skin is warm, dry and intact.  No ecchymosis or abrasions were seen. Psychiatric: Mood and affect are normal. Speech and behavior are normal.  ____________________________________________   LABS (all labs ordered are listed, but only abnormal results are displayed)  Labs Reviewed - No data to display RADIOLOGY  ED MD interpretation:   X-ray left elbow is negative for fracture.  Official radiology report(s): Dg Elbow Complete Left  Result Date: 09/24/2018 CLINICAL DATA:  Pain after fall. EXAM: LEFT ELBOW - COMPLETE 3+ VIEW COMPARISON:  None. FINDINGS: There is a small osteophyte off the radial head. This likely accounts for the contour abnormality on all views.  No convincing evidence of radial head fracture. Degenerative changes are seen in the elbow. No joint effusion. IMPRESSION: Mild irregularity of the radial head is thought to be due to degenerative change/a small osteophyte. No fracture or joint effusion noted. Electronically Signed   By: Dorise Bullion III M.D   On: 09/24/2018 18:59    ____________________________________________   PROCEDURES  Procedure(s) performed: None  Procedures  Critical Care performed: No  ____________________________________________   INITIAL IMPRESSION / ASSESSMENT AND PLAN / ED COURSE  As part of my medical decision making, I reviewed the following data within the [place] from prior ED visits and Home Controlled Substance Database  Presents to the ED after falling while walking across her yard.  She suffered an injury to her left elbow.  She denies any other injury.  There is soft tissue swelling present.  X-ray confirmed that there is no fracture but patient does have some degenerative changes present.  Patient was placed in a sling and given a Norco while in the department.  She is also given a prescription to  fill and take every 6 hours if needed for pain.  She will follow-up with her PCP if any continued problems.  She is encouraged to ice and elevate to decrease swelling.  ____________________________________________   FINAL CLINICAL IMPRESSION(S) / ED DIAGNOSES  Final diagnoses:  Contusion of left elbow, initial encounter  Fall, initial encounter     ED Discharge Orders         Ordered    HYDROcodone-acetaminophen (NORCO/VICODIN) 5-325 MG tablet  Every 6 hours PRN     09/24/18 1915           Note:  This document was prepared using Dragon voice recognition software and may include unintentional dictation errors.    Johnn Hai, PA-C 09/24/18 Raylene Everts, MD 09/24/18 2109

## 2018-11-09 ENCOUNTER — Ambulatory Visit: Payer: Medicare HMO | Admitting: General Surgery

## 2018-11-30 ENCOUNTER — Ambulatory Visit: Payer: Medicare HMO | Admitting: General Surgery

## 2018-12-05 ENCOUNTER — Ambulatory Visit (INDEPENDENT_AMBULATORY_CARE_PROVIDER_SITE_OTHER): Payer: Medicare HMO | Admitting: General Surgery

## 2018-12-05 ENCOUNTER — Encounter: Payer: Self-pay | Admitting: General Surgery

## 2018-12-05 ENCOUNTER — Other Ambulatory Visit: Payer: Self-pay

## 2018-12-05 VITALS — BP 121/77 | HR 92 | Temp 97.7°F | Resp 16 | Ht 65.0 in | Wt 199.0 lb

## 2018-12-05 DIAGNOSIS — R151 Fecal smearing: Secondary | ICD-10-CM

## 2018-12-05 DIAGNOSIS — Z86010 Personal history of colonic polyps: Secondary | ICD-10-CM | POA: Diagnosis not present

## 2018-12-05 DIAGNOSIS — K644 Residual hemorrhoidal skin tags: Secondary | ICD-10-CM

## 2018-12-05 MED ORDER — POLYETHYLENE GLYCOL 3350 17 GM/SCOOP PO POWD: ORAL | 0 refills | Status: DC

## 2018-12-05 MED ORDER — BISACODYL 5 MG PO TBEC: DELAYED_RELEASE_TABLET | ORAL | 0 refills | Status: DC

## 2018-12-05 NOTE — Patient Instructions (Addendum)
The patient is aware to call back for any questions or new concerns. Recommend colonoscopy based on 2013 pathology. Recommend considering PT for strengthening exercises.   Colonoscopy, Adult A colonoscopy is an exam to look at the entire large intestine. During the exam, a lubricated, bendable tube is inserted into the anus and then passed into the rectum, colon, and other parts of the large intestine. A colonoscopy is often done as a part of normal colorectal screening or in response to certain symptoms, such as anemia, persistent diarrhea, abdominal pain, and blood in the stool. The exam can help screen for and diagnose medical problems, including:  Tumors.  Polyps.  Inflammation.  Areas of bleeding.  Tell a health care provider about:  Any allergies you have.  All medicines you are taking, including vitamins, herbs, eye drops, creams, and over-the-counter medicines.  Any problems you or family members have had with anesthetic medicines.  Any blood disorders you have.  Any surgeries you have had.  Any medical conditions you have.  Any problems you have had passing stool. What are the risks? Generally, this is a safe procedure. However, problems may occur, including:  Bleeding.  A tear in the intestine.  A reaction to medicines given during the exam.  Infection (rare).  What happens before the procedure? Eating and drinking restrictions Follow instructions from your health care provider about eating and drinking, which may include:  A few days before the procedure - follow a low-fiber diet. Avoid nuts, seeds, dried fruit, raw fruits, and vegetables.  1-3 days before the procedure - follow a clear liquid diet. Drink only clear liquids, such as clear broth or bouillon, black coffee or tea, clear juice, clear soft drinks or sports drinks, gelatin dessert, and popsicles. Avoid any liquids that contain red or purple dye.  On the day of the procedure - do not eat or  drink anything during the 2 hours before the procedure, or within the time period that your health care provider recommends.  Bowel prep If you were prescribed an oral bowel prep to clean out your colon:  Take it as told by your health care provider. Starting the day before your procedure, you will need to drink a large amount of medicated liquid. The liquid will cause you to have multiple loose stools until your stool is almost clear or light green.  If your skin or anus gets irritated from diarrhea, you may use these to relieve the irritation: ? Medicated wipes, such as adult wet wipes with aloe and vitamin E. ? A skin soothing-product like petroleum jelly.  If you vomit while drinking the bowel prep, take a break for up to 60 minutes and then begin the bowel prep again. If vomiting continues and you cannot take the bowel prep without vomiting, call your health care provider.  General instructions  Ask your health care provider about changing or stopping your regular medicines. This is especially important if you are taking diabetes medicines or blood thinners.  Plan to have someone take you home from the hospital or clinic. What happens during the procedure?  An IV tube may be inserted into one of your veins.  You will be given medicine to help you relax (sedative).  To reduce your risk of infection: ? Your health care team will wash or sanitize their hands. ? Your anal area will be washed with soap.  You will be asked to lie on your side with your knees bent.  Your health care provider  will lubricate a long, thin, flexible tube. The tube will have a camera and a light on the end.  The tube will be inserted into your anus.  The tube will be gently eased through your rectum and colon.  Air will be delivered into your colon to keep it open. You may feel some pressure or cramping.  The camera will be used to take images during the procedure.  A small tissue sample may be  removed from your body to be examined under a microscope (biopsy). If any potential problems are found, the tissue will be sent to a lab for testing.  If small polyps are found, your health care provider may remove them and have them checked for cancer cells.  The tube that was inserted into your anus will be slowly removed. The procedure may vary among health care providers and hospitals. What happens after the procedure?  Your blood pressure, heart rate, breathing rate, and blood oxygen level will be monitored until the medicines you were given have worn off.  Do not drive for 24 hours after the exam.  You may have a small amount of blood in your stool.  You may pass gas and have mild abdominal cramping or bloating due to the air that was used to inflate your colon during the exam.  It is up to you to get the results of your procedure. Ask your health care provider, or the department performing the procedure, when your results will be ready. This information is not intended to replace advice given to you by your health care provider. Make sure you discuss any questions you have with your health care provider. Document Released: 12/10/2000 Document Revised: 10/13/2016 Document Reviewed: 02/24/2016 Elsevier Interactive Patient Education  [employer].   The patient is scheduled for a Colonoscopy at [facility] on 12/13/18. They are aware to call the day before to get their arrival time. She will take her Linzess on 12/10/18 and on 12/12/18. She will hold her Metformin the day of prep and procedure. She will take Dulcolax, two tablets in the morning and evening two days prior. She will only take her Depakote, Lamictal, and Dilantin the morning of with water. Miralax & Dulcolax prescriptions have been sent into the patient's pharmacy. The patient is aware of date and instructions.

## 2018-12-05 NOTE — Progress Notes (Signed)
Patient ID: Kaylee Wolf, female   DOB: 01/04/1951, 67 y.o.   MRN: [id number]  Chief Complaint  Patient presents with  . Hemorrhoids    HPI Kaylee Wolf is a 67 y.o. female here today for a evalaution of hemorrhoids referred by [name]. Patient noticed this area about a month or two ago. She states in the beginning she had pain with constipation, but not painful now since she is using linzess, and no bleeding. She also admits to having stool leakage without her knowing it.  This has been an issue on and off for couple of years. She states her bowels move every other day and sometimes 3 times day.  She is made use of Linzess in the past, but uses infrequently as it causes diarrhea.  She is here with her daughter, Kaylee Wolf.  HPI  Past Medical History:  Diagnosis Date  . Anxiety   . Colon polyp 11/29/2012   at [place].  . Insomnia   . Mixed hyperlipidemia   . Recurrent major depressive disorder ([place])   . RLS (restless legs syndrome)   . Seizures ([place])   . Tinea pedis of both feet   . Type 2 diabetes mellitus ([place]) 2014?  [name] Varicose veins     Past Surgical History:  Procedure Laterality Date  . COLONOSCOPY  11/29/2012   Dr [name]  . none      Family History  Problem Relation Age of Onset  . Diabetes Mother   . Heart disease Mother   . Cancer Father   . Breast cancer Neg Hx     Social History Social History   Tobacco Use  . Smoking status: Never Smoker  . Smokeless tobacco: Never Used  Substance Use Topics  . Alcohol use: No    Alcohol/week: 0.0 standard drinks  . Drug use: No    No Known Allergies  Current Outpatient Medications  Medication Sig Dispense Refill  . aspirin EC 81 MG EC tablet Take 1 tablet (81 mg total) by mouth daily. 30 tablet 0  . atorvastatin (LIPITOR) 20 MG tablet Take 1 tablet (20 mg total) by mouth daily. 90 tablet 3  . blood glucose meter kit and supplies KIT Dispense  based on patient and insurance preference. Check blood glucose once daily. 1 each 0  . Cholecalciferol (VITAMIN D-3) 1000 UNITS CAPS Take 1 capsule by mouth daily.     . divalproex (DEPAKOTE) 125 MG DR tablet Take 1 pill in am and 2 pill at night 90 tablet 1  . escitalopram (LEXAPRO) 20 MG tablet Take 1 tablet (20 mg total) by mouth daily. 30 tablet 11  . gabapentin (NEURONTIN) 400 MG capsule Take 1 capsule (400 mg total) by mouth 2 (two) times daily. 180 capsule 3  . lamoTRIgine (LAMICTAL) 150 MG tablet Take 150 mg by mouth 2 (two) times daily.     [name] linaclotide (LINZESS) 72 MCG capsule Take 72 mcg by mouth as needed.    . metFORMIN (GLUCOPHAGE) 500 MG tablet Take 1 tablet (500 mg total) by mouth 2 (two) times daily. 180 tablet 3  . Multiple Vitamin (MULTIVITAMIN) tablet Take 1 tablet by mouth daily.    [name] omeprazole (PRILOSEC) 20 MG capsule Take 1 capsule (20 mg total) by mouth daily. Before 30 min before first meal of day 30 capsule 5  . ondansetron (ZOFRAN-ODT) 4 MG disintegrating tablet Take 1 tablet (4 mg total) by mouth 2 (two)  times daily. 20 tablet 1  . phenytoin (DILANTIN) 200 MG ER capsule Take 200 mg by mouth 2 (two) times daily.     . bisacodyl (DULCOLAX) 5 MG EC tablet Take 2 tablets in the morning and 2 tablets in the evening 2 days prior to your colonoscopy 4 tablet 0  . polyethylene glycol powder (GLYCOLAX/MIRALAX) powder Mix whole container with 64 ounces of clear liquids, No Red liquids. 255 g 0   No current facility-administered medications for this visit.     Review of Systems Review of Systems  Constitutional: Negative.   Cardiovascular: Negative.   Gastrointestinal: Positive for constipation.    Blood pressure 121/77, pulse 92, temperature 97.7 F (36.5 C), resp. rate 16, height '5\' 5"'  (1.651 m), weight 199 lb (90.3 kg), last menstrual period 05/10/2012, SpO2 94 %.  Physical Exam Physical Exam Constitutional:      Appearance: Normal appearance. She is  well-developed and well-nourished.  HENT:     Mouth/Throat:     Mouth: Oropharynx is clear and moist.  Eyes:     Conjunctiva/sclera: Conjunctivae normal.  Neck:     Musculoskeletal: Neck supple.  Cardiovascular:     Rate and Rhythm: Normal rate and regular rhythm.     Pulses: Intact distal pulses.          Femoral pulses are 2+ on the right side and 2+ on the left side.    Heart sounds: Normal heart sounds.     Comments: Mild lower leg edema 1 + piting Pulmonary:     Effort: Pulmonary effort is normal.     Breath sounds: Normal breath sounds.  Abdominal:     General: Bowel sounds are normal.     Palpations: Abdomen is soft.  Genitourinary:    Rectum: No internal hemorrhoid.       Comments: Anal skin tag Lymphadenopathy:     Cervical: No cervical adenopathy.  Skin:    General: Skin is warm and dry.  Neurological:     Mental Status: She is alert and oriented to person, place, and time.  Psychiatric:        Behavior: Behavior normal.     Data Reviewed Anoscopy: No internal hemorrhoids.  No visible rectal lesion.  No fissure.  Pathology from the December 2013 colonoscopy completed through [facility]): A.SPLENIC FLEXURE POLYP (ENDOSCOPIC POLYPECTOMY):  COLONIC MUCOSA WITH NO PATHOLOGIC DIAGNOSIS. NO ADENOMATOUS MUCOSA IS SEEN.  COMMENT:Evaluation is limited by extensive artifact.   B.COLON POLYPS, 60 (ENDOSCOPIC POLYPECTOMY):  MULTIPLE FRAGMENTS OF SESSILE SERRATED ADENOMA. SEPARATE FRAGMENT OF HYPERPLASTIC POLYP. NO ADENOMATOUS MUCOSA IS SEEN.   Assessment    No evidence of internal/external hemorrhoids.  Variable bowel habit most likely related to IBS.  Past history of sessile serrated adenoma, overdue for follow-up surveillance colonoscopy.    Plan   Recommend colonoscopy based on 2013 pathology.  This can be either scheduled through this office or the patient can discuss this with her PCP.  Recommend considering PT for  strengthening exercises.  This would require referral to [facility], and the patient will defer decision on this at the present time.   Colonoscopy with possible biopsy/polypectomy prn: Information regarding the procedure, including its potential risks and complications (including but not limited to perforation of the bowel, which may require emergency surgery to repair, and bleeding) was verbally given to the patient. Educational information regarding lower intestinal endoscopy was given to the patient. Written instructions for how to complete the bowel prep using Miralax were provided. The  importance of drinking ample fluids to avoid dehydration as a result of the prep emphasized.    HPI, Physical Exam, Assessment and Plan have been scribed under the direction and in the presence of Robert Bellow, MD. Karie Fetch, RN   HPI, Physical Exam, Assessment and Plan have been scribed under the direction and in the presence of Hervey Ard, MD.  Gaspar Cola, CMA  I have completed the exam and reviewed the above documentation for accuracy and completeness.  I agree with the above.  Haematologist has been used and any errors in dictation or transcription are unintentional.  Hervey Ard, M.D., F.A.C.S.  The patient is scheduled for a Colonoscopy at [facility] on 12/13/18. They are aware to call the day before to get their arrival time. She will take her Linzess on 12/10/18 and on 12/12/18. She will hold her Metformin the day of prep and procedure. She will take Dulcolax, two tablets in the morning and evening two days prior. She will only take her Depakote, Lamictal, and Dilantin the morning of with water. Miralax & Dulcolax prescriptions have been sent into the patient's pharmacy. The patient is aware of date and instructions.  Documented by Caryl-Lyn Otis Brace LPN  Forest Gleason  12/07/2018, 7:31 AM

## 2018-12-12 ENCOUNTER — Telehealth: Payer: Self-pay

## 2018-12-12 ENCOUNTER — Other Ambulatory Visit: Payer: Self-pay

## 2018-12-12 DIAGNOSIS — Z86010 Personal history of colonic polyps: Secondary | ICD-10-CM

## 2018-12-12 NOTE — Telephone Encounter (Signed)
Patient called to cancel her colonoscopy scheduled for 12/13/18. She would like a referral to [facility] so she may get this done in the next few weeks.  A referral has been placed to [facility] for this.

## 2018-12-13 ENCOUNTER — Encounter: Admission: RE | Payer: Self-pay | Source: Home / Self Care

## 2018-12-13 ENCOUNTER — Ambulatory Visit: Admission: RE | Admit: 2018-12-13 | Payer: Medicare Other | Source: Home / Self Care | Admitting: General Surgery

## 2018-12-13 SURGERY — COLONOSCOPY WITH PROPOFOL
Anesthesia: General

## 2019-01-04 ENCOUNTER — Encounter: Admission: RE | Payer: Self-pay | Source: Home / Self Care

## 2019-01-04 ENCOUNTER — Encounter: Payer: Self-pay | Admitting: Anesthesiology

## 2019-01-04 ENCOUNTER — Ambulatory Visit: Admission: RE | Admit: 2019-01-04 | Payer: Medicare HMO | Source: Home / Self Care | Admitting: Gastroenterology

## 2019-01-04 DIAGNOSIS — Z86010 Personal history of colonic polyps: Secondary | ICD-10-CM

## 2019-01-04 SURGERY — COLONOSCOPY WITH PROPOFOL
Anesthesia: General

## 2019-01-31 ENCOUNTER — Encounter: Payer: Self-pay | Admitting: Emergency Medicine

## 2019-01-31 ENCOUNTER — Other Ambulatory Visit: Payer: Self-pay

## 2019-01-31 DIAGNOSIS — K831 Obstruction of bile duct: Secondary | ICD-10-CM | POA: Diagnosis not present

## 2019-01-31 DIAGNOSIS — Z7984 Long term (current) use of oral hypoglycemic drugs: Secondary | ICD-10-CM

## 2019-01-31 DIAGNOSIS — Z833 Family history of diabetes mellitus: Secondary | ICD-10-CM

## 2019-01-31 DIAGNOSIS — Y92009 Unspecified place in unspecified non-institutional (private) residence as the place of occurrence of the external cause: Secondary | ICD-10-CM

## 2019-01-31 DIAGNOSIS — Z91128 Patient's intentional underdosing of medication regimen for other reason: Secondary | ICD-10-CM

## 2019-01-31 DIAGNOSIS — E782 Mixed hyperlipidemia: Secondary | ICD-10-CM | POA: Diagnosis present

## 2019-01-31 DIAGNOSIS — C221 Intrahepatic bile duct carcinoma: Secondary | ICD-10-CM | POA: Diagnosis not present

## 2019-01-31 DIAGNOSIS — Z7982 Long term (current) use of aspirin: Secondary | ICD-10-CM

## 2019-01-31 DIAGNOSIS — T426X6A Underdosing of other antiepileptic and sedative-hypnotic drugs, initial encounter: Secondary | ICD-10-CM | POA: Diagnosis present

## 2019-01-31 DIAGNOSIS — F329 Major depressive disorder, single episode, unspecified: Secondary | ICD-10-CM | POA: Diagnosis present

## 2019-01-31 DIAGNOSIS — Z809 Family history of malignant neoplasm, unspecified: Secondary | ICD-10-CM

## 2019-01-31 DIAGNOSIS — L8990 Pressure ulcer of unspecified site, unspecified stage: Secondary | ICD-10-CM | POA: Diagnosis present

## 2019-01-31 DIAGNOSIS — E119 Type 2 diabetes mellitus without complications: Secondary | ICD-10-CM | POA: Diagnosis present

## 2019-01-31 DIAGNOSIS — Z8249 Family history of ischemic heart disease and other diseases of the circulatory system: Secondary | ICD-10-CM

## 2019-01-31 DIAGNOSIS — G40909 Epilepsy, unspecified, not intractable, without status epilepticus: Secondary | ICD-10-CM | POA: Diagnosis present

## 2019-01-31 DIAGNOSIS — C786 Secondary malignant neoplasm of retroperitoneum and peritoneum: Secondary | ICD-10-CM | POA: Diagnosis present

## 2019-01-31 LAB — GLUCOSE, CAPILLARY: Glucose-Capillary: 144 mg/dL — ABNORMAL HIGH (ref 70–99)

## 2019-01-31 NOTE — ED Triage Notes (Signed)
Pt presents to ED with focal type seizure like activity intermittently throughout the day. Hx of the same. Does have daily medications for seizure disorder and due to recent URI she has not been taking her seizure medications off schedule. Pt alert and calm at this time. No distress noted. Answering questions without difficulty.

## 2019-02-01 ENCOUNTER — Inpatient Hospital Stay: Payer: Medicare HMO | Admitting: Certified Registered"

## 2019-02-01 ENCOUNTER — Emergency Department: Payer: Medicare HMO

## 2019-02-01 ENCOUNTER — Other Ambulatory Visit: Payer: Self-pay

## 2019-02-01 ENCOUNTER — Encounter
Admission: EM | Disposition: A | Discharge status: Home Health | Payer: Self-pay | Source: Home / Self Care | Attending: Internal Medicine

## 2019-02-01 ENCOUNTER — Inpatient Hospital Stay: Payer: Medicare HMO

## 2019-02-01 ENCOUNTER — Ambulatory Visit: Admit: 2019-02-01 | Payer: Medicare HMO | Admitting: Gastroenterology

## 2019-02-01 ENCOUNTER — Inpatient Hospital Stay
Admission: EM | Admit: 2019-02-01 | Discharge: 2019-02-06 | DRG: 435 | Disposition: A | Discharge status: Home Health | Payer: Medicare HMO | Attending: Internal Medicine | Admitting: Internal Medicine

## 2019-02-01 DIAGNOSIS — R112 Nausea with vomiting, unspecified: Secondary | ICD-10-CM

## 2019-02-01 DIAGNOSIS — R1011 Right upper quadrant pain: Secondary | ICD-10-CM | POA: Diagnosis not present

## 2019-02-01 DIAGNOSIS — T426X6A Underdosing of other antiepileptic and sedative-hypnotic drugs, initial encounter: Secondary | ICD-10-CM | POA: Diagnosis present

## 2019-02-01 DIAGNOSIS — Z7984 Long term (current) use of oral hypoglycemic drugs: Secondary | ICD-10-CM | POA: Diagnosis not present

## 2019-02-01 DIAGNOSIS — N390 Urinary tract infection, site not specified: Secondary | ICD-10-CM

## 2019-02-01 DIAGNOSIS — Z91128 Patient's intentional underdosing of medication regimen for other reason: Secondary | ICD-10-CM | POA: Diagnosis not present

## 2019-02-01 DIAGNOSIS — Z809 Family history of malignant neoplasm, unspecified: Secondary | ICD-10-CM | POA: Diagnosis not present

## 2019-02-01 DIAGNOSIS — L8990 Pressure ulcer of unspecified site, unspecified stage: Secondary | ICD-10-CM

## 2019-02-01 DIAGNOSIS — J9601 Acute respiratory failure with hypoxia: Secondary | ICD-10-CM

## 2019-02-01 DIAGNOSIS — E782 Mixed hyperlipidemia: Secondary | ICD-10-CM | POA: Diagnosis present

## 2019-02-01 DIAGNOSIS — R140 Abdominal distension (gaseous): Secondary | ICD-10-CM | POA: Diagnosis not present

## 2019-02-01 DIAGNOSIS — R188 Other ascites: Secondary | ICD-10-CM | POA: Diagnosis not present

## 2019-02-01 DIAGNOSIS — C221 Intrahepatic bile duct carcinoma: Secondary | ICD-10-CM | POA: Diagnosis present

## 2019-02-01 DIAGNOSIS — K831 Obstruction of bile duct: Secondary | ICD-10-CM

## 2019-02-01 DIAGNOSIS — E119 Type 2 diabetes mellitus without complications: Secondary | ICD-10-CM | POA: Diagnosis present

## 2019-02-01 DIAGNOSIS — C786 Secondary malignant neoplasm of retroperitoneum and peritoneum: Secondary | ICD-10-CM

## 2019-02-01 DIAGNOSIS — K769 Liver disease, unspecified: Secondary | ICD-10-CM | POA: Diagnosis not present

## 2019-02-01 DIAGNOSIS — Z8249 Family history of ischemic heart disease and other diseases of the circulatory system: Secondary | ICD-10-CM | POA: Diagnosis not present

## 2019-02-01 DIAGNOSIS — Z833 Family history of diabetes mellitus: Secondary | ICD-10-CM | POA: Diagnosis not present

## 2019-02-01 DIAGNOSIS — R0902 Hypoxemia: Secondary | ICD-10-CM | POA: Diagnosis not present

## 2019-02-01 DIAGNOSIS — R17 Unspecified jaundice: Secondary | ICD-10-CM | POA: Diagnosis not present

## 2019-02-01 DIAGNOSIS — K838 Other specified diseases of biliary tract: Secondary | ICD-10-CM

## 2019-02-01 DIAGNOSIS — C801 Malignant (primary) neoplasm, unspecified: Secondary | ICD-10-CM

## 2019-02-01 DIAGNOSIS — A419 Sepsis, unspecified organism: Secondary | ICD-10-CM | POA: Diagnosis not present

## 2019-02-01 DIAGNOSIS — G40909 Epilepsy, unspecified, not intractable, without status epilepticus: Secondary | ICD-10-CM | POA: Diagnosis present

## 2019-02-01 DIAGNOSIS — J189 Pneumonia, unspecified organism: Secondary | ICD-10-CM | POA: Diagnosis not present

## 2019-02-01 DIAGNOSIS — C787 Secondary malignant neoplasm of liver and intrahepatic bile duct: Secondary | ICD-10-CM | POA: Diagnosis not present

## 2019-02-01 DIAGNOSIS — Z7982 Long term (current) use of aspirin: Secondary | ICD-10-CM | POA: Diagnosis not present

## 2019-02-01 DIAGNOSIS — Y92009 Unspecified place in unspecified non-institutional (private) residence as the place of occurrence of the external cause: Secondary | ICD-10-CM | POA: Diagnosis not present

## 2019-02-01 DIAGNOSIS — F329 Major depressive disorder, single episode, unspecified: Secondary | ICD-10-CM | POA: Diagnosis present

## 2019-02-01 HISTORY — PX: ERCP: SHX5425

## 2019-02-01 LAB — CBC WITH DIFFERENTIAL/PLATELET
Abs Immature Granulocytes: 0.03 10*3/uL (ref 0.00–0.07)
Basophils Absolute: 0.1 10*3/uL (ref 0.0–0.1)
Basophils Relative: 1 %
EOS PCT: 1 %
Eosinophils Absolute: 0.1 10*3/uL (ref 0.0–0.5)
HCT: 39.4 % (ref 36.0–46.0)
Hemoglobin: 12.2 g/dL (ref 12.0–15.0)
Immature Granulocytes: 0 %
Lymphocytes Relative: 13 %
Lymphs Abs: 0.8 10*3/uL (ref 0.7–4.0)
MCH: 27.7 pg (ref 26.0–34.0)
MCHC: 31 g/dL (ref 30.0–36.0)
MCV: 89.5 fL (ref 80.0–100.0)
Monocytes Absolute: 1 10*3/uL (ref 0.1–1.0)
Monocytes Relative: 15 %
Neutro Abs: 4.7 10*3/uL (ref 1.7–7.7)
Neutrophils Relative %: 70 %
Platelets: 409 10*3/uL — ABNORMAL HIGH (ref 150–400)
RBC: 4.4 MIL/uL (ref 3.87–5.11)
RDW: 16.5 % — ABNORMAL HIGH (ref 11.5–15.5)
WBC: 6.7 10*3/uL (ref 4.0–10.5)
nRBC: 0 % (ref 0.0–0.2)

## 2019-02-01 LAB — URINALYSIS, COMPLETE (UACMP) WITH MICROSCOPIC
GLUCOSE, UA: NEGATIVE mg/dL
Ketones, ur: NEGATIVE mg/dL
Nitrite: NEGATIVE
Protein, ur: 30 mg/dL — AB
Specific Gravity, Urine: 1.028 (ref 1.005–1.030)
pH: 5 (ref 5.0–8.0)

## 2019-02-01 LAB — COMPREHENSIVE METABOLIC PANEL
ALT: 84 U/L — ABNORMAL HIGH (ref 0–44)
AST: 173 U/L — ABNORMAL HIGH (ref 15–41)
Albumin: 2.8 g/dL — ABNORMAL LOW (ref 3.5–5.0)
Alkaline Phosphatase: 1171 U/L — ABNORMAL HIGH (ref 38–126)
Anion gap: 9 (ref 5–15)
BUN: 13 mg/dL (ref 8–23)
CALCIUM: 8.4 mg/dL — AB (ref 8.9–10.3)
CO2: 29 mmol/L (ref 22–32)
Chloride: 98 mmol/L (ref 98–111)
Creatinine, Ser: 0.76 mg/dL (ref 0.44–1.00)
GFR calc Af Amer: >60 mL/min (ref >60)
Glucose, Bld: 198 mg/dL — ABNORMAL HIGH (ref 70–99)
Potassium: 4.1 mmol/L (ref 3.5–5.1)
Sodium: 136 mmol/L (ref 135–145)
Total Bilirubin: 3.3 mg/dL — ABNORMAL HIGH (ref 0.3–1.2)
Total Protein: 7.2 g/dL (ref 6.5–8.1)

## 2019-02-01 LAB — VALPROIC ACID LEVEL: Valproic Acid Lvl: 28 ug/mL — ABNORMAL LOW (ref 50.0–100.0)

## 2019-02-01 LAB — GLUCOSE, CAPILLARY
GLUCOSE-CAPILLARY: 164 mg/dL — AB (ref 70–99)
Glucose-Capillary: 173 mg/dL — ABNORMAL HIGH (ref 70–99)
Glucose-Capillary: 114 mg/dL — ABNORMAL HIGH (ref 70–99)

## 2019-02-01 LAB — PHENYTOIN LEVEL, TOTAL: Phenytoin Lvl: <2.5 ug/mL — ABNORMAL LOW (ref 10.0–20.0)

## 2019-02-01 SURGERY — ERCP, WITH INTERVENTION IF INDICATED
Anesthesia: General

## 2019-02-01 MED ORDER — POLYETHYLENE GLYCOL 3350 17 G PO PACK: 17.0000 g | PACK | Freq: Every day | ORAL | Status: DC | PRN

## 2019-02-01 MED ORDER — DIVALPROEX SODIUM 250 MG PO DR TAB
250.0000 mg | DELAYED_RELEASE_TABLET | Freq: Once | ORAL | Status: AC
  Administered 2019-02-01: 250 mg via ORAL
  Filled 2019-02-01: qty 1

## 2019-02-01 MED ORDER — MIDAZOLAM HCL 2 MG/2ML IJ SOLN
INTRAMUSCULAR | Status: AC
  Filled 2019-02-01: qty 2

## 2019-02-01 MED ORDER — INSULIN ASPART 100 UNIT/ML ~~LOC~~ SOLN: 0.0000 [IU] | Freq: Every day | SUBCUTANEOUS | Status: DC

## 2019-02-01 MED ORDER — FENTANYL CITRATE (PF) 100 MCG/2ML IJ SOLN
INTRAMUSCULAR | Status: AC
  Filled 2019-02-01: qty 2

## 2019-02-01 MED ORDER — ACETAMINOPHEN 650 MG RE SUPP: 650.0000 mg | Freq: Four times a day (QID) | RECTAL | Status: DC | PRN

## 2019-02-01 MED ORDER — SUGAMMADEX SODIUM 200 MG/2ML IV SOLN
INTRAVENOUS | Status: DC | PRN
  Administered 2019-02-01: 170 mg via INTRAVENOUS

## 2019-02-01 MED ORDER — SUCCINYLCHOLINE CHLORIDE 20 MG/ML IJ SOLN
INTRAMUSCULAR | Status: DC | PRN
  Administered 2019-02-01: 100 mg via INTRAVENOUS

## 2019-02-01 MED ORDER — ONDANSETRON HCL 4 MG PO TABS: 4.0000 mg | ORAL_TABLET | Freq: Four times a day (QID) | ORAL | Status: DC | PRN

## 2019-02-01 MED ORDER — ADULT MULTIVITAMIN W/MINERALS CH
1.0000 | ORAL_TABLET | Freq: Every day | ORAL | Status: DC
  Administered 2019-02-01 – 2019-02-06 (×6): 1 via ORAL
  Filled 2019-02-01 (×6): qty 1

## 2019-02-01 MED ORDER — PHENYTOIN SODIUM EXTENDED 100 MG PO CAPS
200.0000 mg | ORAL_CAPSULE | Freq: Two times a day (BID) | ORAL | Status: DC
  Administered 2019-02-01 – 2019-02-06 (×10): 200 mg via ORAL
  Filled 2019-02-01 (×12): qty 2

## 2019-02-01 MED ORDER — IOPAMIDOL (ISOVUE-300) INJECTION 61%
100.0000 mL | Freq: Once | INTRAVENOUS | Status: AC | PRN
  Administered 2019-02-01: 100 mL via INTRAVENOUS

## 2019-02-01 MED ORDER — LAMOTRIGINE 25 MG PO TABS
150.0000 mg | ORAL_TABLET | Freq: Two times a day (BID) | ORAL | Status: DC
  Administered 2019-02-01 – 2019-02-06 (×10): 150 mg via ORAL
  Filled 2019-02-01 (×2): qty 1
  Filled 2019-02-01: qty 2
  Filled 2019-02-01 (×4): qty 1
  Filled 2019-02-01: qty 2
  Filled 2019-02-01 (×3): qty 1

## 2019-02-01 MED ORDER — INSULIN ASPART 100 UNIT/ML ~~LOC~~ SOLN
0.0000 [IU] | Freq: Three times a day (TID) | SUBCUTANEOUS | Status: DC
  Administered 2019-02-01 – 2019-02-03 (×2): 2 [IU] via SUBCUTANEOUS
  Filled 2019-02-01 (×2): qty 1

## 2019-02-01 MED ORDER — SODIUM CHLORIDE 0.9 % IV SOLN: 1500.0000 mg | Freq: Once | INTRAVENOUS | Status: DC

## 2019-02-01 MED ORDER — ASPIRIN EC 81 MG PO TBEC
81.0000 mg | DELAYED_RELEASE_TABLET | Freq: Every day | ORAL | Status: DC
  Administered 2019-02-01 – 2019-02-06 (×6): 81 mg via ORAL
  Filled 2019-02-01 (×6): qty 1

## 2019-02-01 MED ORDER — GABAPENTIN 300 MG PO CAPS
400.0000 mg | ORAL_CAPSULE | Freq: Two times a day (BID) | ORAL | Status: DC
  Administered 2019-02-01 – 2019-02-06 (×10): 400 mg via ORAL
  Filled 2019-02-01 (×10): qty 1

## 2019-02-01 MED ORDER — SODIUM CHLORIDE 0.9 % IV SOLN
INTRAVENOUS | Status: DC
  Administered 2019-02-01: 1000 mL via INTRAVENOUS

## 2019-02-01 MED ORDER — SODIUM CHLORIDE 0.9 % IV SOLN
1500.0000 mg | Freq: Once | INTRAVENOUS | Status: AC
  Administered 2019-02-01: 1500 mg via INTRAVENOUS
  Filled 2019-02-01: qty 30

## 2019-02-01 MED ORDER — ESCITALOPRAM OXALATE 10 MG PO TABS
20.0000 mg | ORAL_TABLET | Freq: Every day | ORAL | Status: DC
  Administered 2019-02-01 – 2019-02-06 (×6): 20 mg via ORAL
  Filled 2019-02-01 (×6): qty 2

## 2019-02-01 MED ORDER — DIVALPROEX SODIUM 125 MG PO CSDR
250.0000 mg | DELAYED_RELEASE_CAPSULE | Freq: Every day | ORAL | Status: DC
  Administered 2019-02-01 – 2019-02-06 (×5): 250 mg via ORAL
  Filled 2019-02-01 (×7): qty 2

## 2019-02-01 MED ORDER — ONDANSETRON HCL 4 MG/2ML IJ SOLN
4.0000 mg | Freq: Four times a day (QID) | INTRAMUSCULAR | Status: DC | PRN
  Administered 2019-02-02 (×2): 4 mg via INTRAVENOUS
  Filled 2019-02-01 (×2): qty 2

## 2019-02-01 MED ORDER — DEXAMETHASONE SODIUM PHOSPHATE 10 MG/ML IJ SOLN
INTRAMUSCULAR | Status: DC | PRN
  Administered 2019-02-01: 4 mg via INTRAVENOUS

## 2019-02-01 MED ORDER — ROCURONIUM BROMIDE 100 MG/10ML IV SOLN
INTRAVENOUS | Status: DC | PRN
  Administered 2019-02-01: 10 mg via INTRAVENOUS

## 2019-02-01 MED ORDER — MIDAZOLAM HCL 2 MG/2ML IJ SOLN
INTRAMUSCULAR | Status: DC | PRN
  Administered 2019-02-01: 1 mg via INTRAVENOUS

## 2019-02-01 MED ORDER — ONDANSETRON HCL 4 MG/2ML IJ SOLN: 4.0000 mg | Freq: Once | INTRAMUSCULAR | Status: DC | PRN

## 2019-02-01 MED ORDER — OXYCODONE HCL 5 MG PO TABS: 5.0000 mg | ORAL_TABLET | ORAL | Status: DC | PRN

## 2019-02-01 MED ORDER — PROPOFOL 10 MG/ML IV BOLUS
INTRAVENOUS | Status: DC | PRN
  Administered 2019-02-01: 150 mg via INTRAVENOUS

## 2019-02-01 MED ORDER — PANTOPRAZOLE SODIUM 40 MG PO TBEC
40.0000 mg | DELAYED_RELEASE_TABLET | Freq: Every day | ORAL | Status: DC
  Administered 2019-02-01 – 2019-02-06 (×6): 40 mg via ORAL
  Filled 2019-02-01 (×6): qty 1

## 2019-02-01 MED ORDER — ONDANSETRON HCL 4 MG/2ML IJ SOLN
INTRAMUSCULAR | Status: DC | PRN
  Administered 2019-02-01: 4 mg via INTRAVENOUS

## 2019-02-01 MED ORDER — LAMOTRIGINE 25 MG PO TABS
150.0000 mg | ORAL_TABLET | ORAL | Status: AC
  Administered 2019-02-01: 150 mg via ORAL
  Filled 2019-02-01: qty 1

## 2019-02-01 MED ORDER — INDOMETHACIN 50 MG RE SUPP
100.0000 mg | Freq: Once | RECTAL | Status: AC
  Administered 2019-02-01: 100 mg via RECTAL
  Filled 2019-02-01: qty 2

## 2019-02-01 MED ORDER — FENTANYL CITRATE (PF) 100 MCG/2ML IJ SOLN
INTRAMUSCULAR | Status: DC | PRN
  Administered 2019-02-01: 50 ug via INTRAVENOUS

## 2019-02-01 MED ORDER — LIDOCAINE HCL (CARDIAC) PF 100 MG/5ML IV SOSY
PREFILLED_SYRINGE | INTRAVENOUS | Status: DC | PRN
  Administered 2019-02-01: 70 mg via INTRAVENOUS

## 2019-02-01 MED ORDER — FENTANYL CITRATE (PF) 100 MCG/2ML IJ SOLN: 25.0000 ug | INTRAMUSCULAR | Status: DC | PRN

## 2019-02-01 MED ORDER — INDOMETHACIN 50 MG RE SUPP
RECTAL | Status: AC
  Administered 2019-02-01: 100 mg via RECTAL
  Filled 2019-02-01: qty 2

## 2019-02-01 MED ORDER — ONDANSETRON HCL 4 MG/2ML IJ SOLN
4.0000 mg | INTRAMUSCULAR | Status: AC
  Administered 2019-02-01: 4 mg via INTRAVENOUS
  Filled 2019-02-01: qty 2

## 2019-02-01 MED ORDER — ACETAMINOPHEN 325 MG PO TABS: 650.0000 mg | ORAL_TABLET | Freq: Four times a day (QID) | ORAL | Status: DC | PRN

## 2019-02-01 MED ORDER — ENOXAPARIN SODIUM 40 MG/0.4ML ~~LOC~~ SOLN
40.0000 mg | SUBCUTANEOUS | Status: DC
  Administered 2019-02-01 – 2019-02-02 (×2): 40 mg via SUBCUTANEOUS
  Filled 2019-02-01 (×2): qty 0.4

## 2019-02-01 MED ORDER — MORPHINE SULFATE (PF) 4 MG/ML IV SOLN: 4.0000 mg | INTRAVENOUS | Status: DC | PRN

## 2019-02-01 MED ORDER — PROPOFOL 500 MG/50ML IV EMUL
INTRAVENOUS | Status: AC
  Filled 2019-02-01: qty 50

## 2019-02-01 MED ORDER — SODIUM CHLORIDE 0.9 % IV SOLN
INTRAVENOUS | Status: DC | PRN
  Administered 2019-02-01: 12:00:00 via INTRAVENOUS

## 2019-02-01 MED ORDER — SODIUM CHLORIDE 0.9 % IV SOLN
INTRAVENOUS | Status: DC
  Administered 2019-02-01 – 2019-02-06 (×10): via INTRAVENOUS

## 2019-02-01 MED ORDER — ATORVASTATIN CALCIUM 20 MG PO TABS
20.0000 mg | ORAL_TABLET | Freq: Every day | ORAL | Status: DC
  Administered 2019-02-01 – 2019-02-05 (×5): 20 mg via ORAL
  Filled 2019-02-01 (×6): qty 1

## 2019-02-01 MED ORDER — SODIUM CHLORIDE 0.9 % IV BOLUS
250.0000 mL | Freq: Once | INTRAVENOUS | Status: AC
  Administered 2019-02-01: 250 mL via INTRAVENOUS

## 2019-02-01 MED ORDER — CEPHALEXIN 500 MG PO CAPS
500.0000 mg | ORAL_CAPSULE | Freq: Once | ORAL | Status: AC
  Administered 2019-02-01: 500 mg via ORAL
  Filled 2019-02-01: qty 1

## 2019-02-01 MED ORDER — DIVALPROEX SODIUM 125 MG PO DR TAB
125.0000 mg | DELAYED_RELEASE_TABLET | Freq: Every morning | ORAL | Status: DC
  Administered 2019-02-01 – 2019-02-06 (×6): 125 mg via ORAL
  Filled 2019-02-01 (×6): qty 1

## 2019-02-01 NOTE — Anesthesia Preprocedure Evaluation (Signed)
Anesthesia Evaluation  Patient identified by MRN, date of birth, ID band Patient awake    Reviewed: Allergy & Precautions, H&P , NPO status , Patient's Chart, lab work & pertinent test results, reviewed documented beta blocker date and time   Airway Mallampati: II  TM Distance: >3 FB Neck ROM: full    Dental  (+) Teeth Intact   Pulmonary neg pulmonary ROS,    Pulmonary exam normal        Cardiovascular Exercise Tolerance: Poor negative cardio ROS Normal cardiovascular exam Rhythm:regular Rate:Normal     Neuro/Psych  Headaches, Seizures -, Poorly Controlled,  PSYCHIATRIC DISORDERS Anxiety Depression Pt with seizure last night with a past history of generalized sz. Disorder but reportedly noncompliant on medication.  No repeat sz. Activity overnight.  Medication has been reinstated and pt. Requires an ercp for further eval. Of liver pathology.  I feel pt is safe to undergo GA for the above procedure.  JA negative neurological ROS  negative psych ROS   GI/Hepatic negative GI ROS, Neg liver ROS,   Endo/Other  negative endocrine ROSdiabetes  Renal/GU negative Renal ROS  negative genitourinary   Musculoskeletal   Abdominal   Peds  Hematology negative hematology ROS (+)   Anesthesia Other Findings Past Medical History: No date: Anxiety 11/29/2012: Colon polyp     Comment:  at [facility]  MULTIPLE FRAGMENTS OF SESSILE SERRATED ADENOMA. No date: Insomnia No date: Mixed hyperlipidemia No date: Recurrent major depressive disorder ([facility]) No date: RLS (restless legs syndrome) No date: Seizures ([place]) No date: Tinea pedis of both feet 2014?: Type 2 diabetes mellitus ([place]) No date: Varicose veins Past Surgical History: 11/29/2012: COLONOSCOPY     Comment:  Dr [name] No date: none BMI    Body Mass Index:  32.58 kg/m     Reproductive/Obstetrics negative OB ROS                             Anesthesia  Physical Anesthesia Plan  ASA: III  Anesthesia Plan: General ETT   Post-op Pain Management:    Induction:   PONV Risk Score and Plan:   Airway Management Planned:   Additional Equipment:   Intra-op Plan:   Post-operative Plan:   Informed Consent: I have reviewed the patients History and Physical, chart, labs and discussed the procedure including the risks, benefits and alternatives for the proposed anesthesia with the patient or authorized representative who has indicated his/her understanding and acceptance.     Dental Advisory Given  Plan Discussed with: CRNA  Anesthesia Plan Comments:         Anesthesia Quick Evaluation

## 2019-02-01 NOTE — ED Notes (Signed)
Patient placed on 1L [place] because O2 was in high 80's

## 2019-02-01 NOTE — ED Notes (Addendum)
Patient arrived back from ultrasound.

## 2019-02-01 NOTE — Anesthesia Post-op Follow-up Note (Signed)
Anesthesia QCDR form completed.        

## 2019-02-01 NOTE — ED Notes (Signed)
Patient is in ultrasound.

## 2019-02-01 NOTE — ED Notes (Signed)
Received call from Dr. Allen Norris stating that pt will likely be able to go for ERCP today at noon and that she should remain NPO instead of changing to clear liquid. Orders transcribed.

## 2019-02-01 NOTE — H&P (Signed)
[facility] NAME: Minaal Struckman    MR#:  [id number]  DATE OF BIRTH:  1951/03/31  DATE OF ADMISSION:  02/01/2019  PRIMARY CARE PHYSICIAN: Remi Haggard, FNP   REQUESTING/REFERRING PHYSICIAN: Hinda Kehr, MD  CHIEF COMPLAINT:   Chief Complaint  Patient presents with  . Nasal Congestion  . Seizures    HISTORY OF PRESENT ILLNESS:  Floyd Lusignan  is a 68 y.o. female with a known history of epilepsy, type 2 diabetes, hyperlipidemia, depression presented to the ED with generalized abdominal pain, cough, rhinorrhea, and increased seizure-like activity.  Her cough and runny nose started about 5 days ago.  She was taking Mucinex for this, which did not help.  Her family noticed increased seizure-like activity over the last couple of days.  During her seizures she typically "zones out" and then becomes very confused.  Family has also noticed that she has been sleeping a lot recently and has had some mild confusion.  She endorses right upper quadrant and epigastric abdominal pain over the couple of weeks.  She endorses nausea and vomiting.  She denies any fevers or chills.  She denies any chest pain or shortness of breath.  In the ED, she was requiring 2 L O2 by nasal cannula.  Vitals were otherwise unremarkable.  Labs were significant for alk phos 1171, AST 173, ALT 84.  UA with small hemoglobin, moderate leukocytes, rare bacteria.  Abdominal ultrasound showed 3 liver masses concerning for metastatic disease, cholelithiasis and sludge in the gallbladder.  CT abdomen pelvis showed intrahepatic biliary dilatation above a thickened enhancing common bile duct concerning for cholangiocarcinoma, in addition to right liver masses due to metastatic disease versus early biliary abscesses.  She also had a nodular omentum consistent with peritoneal carcinomatosis.  Hospitalists were called for admission.  PAST MEDICAL HISTORY:   Past Medical History:    Diagnosis Date  . Anxiety   . Colon polyp 11/29/2012   at [place].  . Insomnia   . Mixed hyperlipidemia   . Recurrent major depressive disorder ([place])   . RLS (restless legs syndrome)   . Seizures ([place])   . Tinea pedis of both feet   . Type 2 diabetes mellitus ([place]) 2014?  [name] Varicose veins     PAST SURGICAL HISTORY:   Past Surgical History:  Procedure Laterality Date  . COLONOSCOPY  11/29/2012   Dr [name]  . none      SOCIAL HISTORY:   Social History   Tobacco Use  . Smoking status: Never Smoker  . Smokeless tobacco: Never Used  Substance Use Topics  . Alcohol use: No    Alcohol/week: 0.0 standard drinks    FAMILY HISTORY:   Family History  Problem Relation Age of Onset  . Diabetes Mother   . Heart disease Mother   . Cancer Father   . Breast cancer Neg Hx     DRUG ALLERGIES:  No Known Allergies  REVIEW OF SYSTEMS:   Review of Systems  Constitutional: Positive for malaise/fatigue. Negative for chills and fever.  HENT: Positive for congestion and sore throat.   Eyes: Negative for blurred vision and double vision.  Respiratory: Positive for cough. Negative for shortness of breath.   Cardiovascular: Negative for chest pain, palpitations and leg swelling.  Gastrointestinal: Positive for abdominal pain, nausea and vomiting.  Genitourinary: Negative for dysuria and urgency.  Musculoskeletal: Negative for back pain and neck  pain.  Neurological: Positive for seizures and weakness. Negative for dizziness, focal weakness and headaches.  Psychiatric/Behavioral: Negative for depression. The patient is not nervous/anxious.    MEDICATIONS AT HOME:   Prior to Admission medications   Medication Sig Start Date End Date Taking? Authorizing Provider  aspirin EC 81 MG EC tablet Take 1 tablet (81 mg total) by mouth daily. 02/03/18   Loletha Grayer, MD  atorvastatin (LIPITOR) 20 MG tablet Take 1 tablet (20 mg total) by mouth  daily. 09/06/17   Karamalegos, Devonne Doughty, DO  bisacodyl (DULCOLAX) 5 MG EC tablet Take 2 tablets in the morning and 2 tablets in the evening 2 days prior to your colonoscopy 12/05/18   Byrnett, Forest Gleason, MD  blood glucose meter kit and supplies KIT Dispense based on patient and insurance preference. Check blood glucose once daily. 11/25/15   Luciana Axe, NP  Cholecalciferol (VITAMIN D-3) 1000 UNITS CAPS Take 1 capsule by mouth daily.     [provider]  divalproex (DEPAKOTE) 125 MG DR tablet Take 1 pill in am and 2 pill at night 02/21/17   Rainey Pines, MD  escitalopram (LEXAPRO) 20 MG tablet Take 1 tablet (20 mg total) by mouth daily. 09/06/17   Karamalegos, Devonne Doughty, DO  gabapentin (NEURONTIN) 400 MG capsule Take 1 capsule (400 mg total) by mouth 2 (two) times daily. 09/06/17   Karamalegos, Devonne Doughty, DO  lamoTRIgine (LAMICTAL) 150 MG tablet Take 150 mg by mouth 2 (two) times daily.  07/20/16   [provider]  linaclotide (LINZESS) 72 MCG capsule Take 72 mcg by mouth as needed.    [provider]  metFORMIN (GLUCOPHAGE) 500 MG tablet Take 1 tablet (500 mg total) by mouth 2 (two) times daily. 09/06/17   Karamalegos, Devonne Doughty, DO  Multiple Vitamin (MULTIVITAMIN) tablet Take 1 tablet by mouth daily.    [provider]  omeprazole (PRILOSEC) 20 MG capsule Take 1 capsule (20 mg total) by mouth daily. Before 30 min before first meal of day 09/06/17   Olin Hauser, DO  ondansetron (ZOFRAN-ODT) 4 MG disintegrating tablet Take 1 tablet (4 mg total) by mouth 2 (two) times daily. 09/06/17   Karamalegos, Devonne Doughty, DO  phenytoin (DILANTIN) 200 MG ER capsule Take 200 mg by mouth 2 (two) times daily.  11/17/15   [provider]  polyethylene glycol powder (GLYCOLAX/MIRALAX) powder Mix whole container with 64 ounces of clear liquids, No Red liquids. 12/05/18   Robert Bellow, MD      VITAL SIGNS:  Blood pressure 115/68, pulse 89,  temperature 97.7 F (36.5 C), temperature source Oral, resp. rate 18, height '5\' 4"'$  (1.626 m), weight 86.2 kg, last menstrual period 05/10/2012, SpO2 91 %.  PHYSICAL EXAMINATION:  Physical Exam  GENERAL:  68 y.o.-year-old patient lying in the bed with no acute distress.  Resting comfortably. EYES: Pupils equal, round, reactive to light and accommodation. No scleral icterus. Extraocular muscles intact.  HEENT: Head atraumatic, normocephalic. Oropharynx and nasopharynx clear.  NECK:  Supple, no jugular venous distention. No thyroid enlargement, no tenderness.  LUNGS: Normal breath sounds bilaterally, no wheezing, rales,rhonchi or crepitation. No use of accessory muscles of respiration. [place] in place. CARDIOVASCULAR: RRR, S1, S2 normal. No murmurs, rubs, or gallops.  ABDOMEN: Soft, nontender, nondistended. Bowel sounds present. No organomegaly or mass.  EXTREMITIES: No pedal edema, cyanosis, or clubbing.  NEUROLOGIC: Cranial nerves II through XII are intact. +global weakness. Sensation intact. Gait not checked.  PSYCHIATRIC: The patient  is alert and oriented x 3.  SKIN: No obvious rash, lesion, or ulcer.   LABORATORY PANEL:   CBC Recent Labs  Lab 01/31/19 2222  WBC 6.7  HGB 12.2  HCT 39.4  PLT 409*   ------------------------------------------------------------------------------------------------------------------  Chemistries  Recent Labs  Lab 01/31/19 2222  NA 136  K 4.1  CL 98  CO2 29  GLUCOSE 198*  BUN 13  CREATININE 0.76  CALCIUM 8.4*  AST 173*  ALT 84*  ALKPHOS 1,171*  BILITOT 3.3*   ------------------------------------------------------------------------------------------------------------------  Cardiac Enzymes No results for input(s): TROPONINI in the last 168 hours. ------------------------------------------------------------------------------------------------------------------  RADIOLOGY:  Ct Chest W Contrast  Result Date: 02/01/2019 CLINICAL DATA:  Cough  with persistent shortness of breath. EXAM: CT CHEST, ABDOMEN, AND PELVIS WITH CONTRAST TECHNIQUE: Multidetector CT imaging of the chest, abdomen and pelvis was performed following the standard protocol during bolus administration of intravenous contrast. CONTRAST:  171m ISOVUE-300 IOPAMIDOL (ISOVUE-300) INJECTION 61% COMPARISON:  Right upper quadrant ultrasound earlier today FINDINGS: CT CHEST FINDINGS Cardiovascular: Normal heart size. No pericardial effusion. Coronary atherosclerosis. Mild aortic atherosclerosis. Mediastinum/Nodes: Negative for adenopathy. Lungs/Pleura: Mild dependent atelectasis. No consolidation or nodularity Musculoskeletal: No acute or aggressive finding. Generalized spondylosis. CT ABDOMEN PELVIS FINDINGS Hepatobiliary: 4 or 5 indistinct liver masses centered on the right, measuring up to 2.3 cm. There is cholelithiasis and gallbladder wall thickening with mild fat indistinctness. Murphy sign was negative on prior ultrasound. There is intrahepatic biliary duct dilatation above a circumferentially thickened common bile duct. Small subcapsular fluid collection along the right liver without serosal enhancement, favor loculated peritoneal fluid. Pancreas: No evident mass or inflammation Spleen: Eggshell calcification at the hilum measuring 2.3 cm, compatible with remote insult. Adrenals/Urinary Tract: Negative adrenals. No hydronephrosis or stone. Negative bladder. Stomach/Bowel: No evident obstruction, inflammation, or mass. Vascular/Lymphatic: Prominent but not strictly enlarged lymph nodes in the deep liver drainage. Reproductive: Negative Other: Small volume ascites. Nodular appearance of the omentum including omentum that is herniated through a fatty umbilical hernia. Musculoskeletal: No acute or aggressive finding. Advanced lumbar facet degeneration. IMPRESSION: 1. Intrahepatic biliary dilatation of above a thickened enhancing common bile duct primarily concerning for cholangiocarcinoma  given constellation of findings. Please correlate for cholangitis symptoms. 2. Indistinct right liver masses, favor metastatic disease over early biliary abscesses. 3. Nodular omentum most consistent with peritoneal carcinomatosis. 4. Cholelithiasis. 5. No acute or malignant finding in the chest. Electronically Signed   By: JMonte FantasiaM.D.   On: 02/01/2019 06:34   Ct Abdomen Pelvis W Contrast  Result Date: 02/01/2019 CLINICAL DATA:  Cough with persistent shortness of breath. EXAM: CT CHEST, ABDOMEN, AND PELVIS WITH CONTRAST TECHNIQUE: Multidetector CT imaging of the chest, abdomen and pelvis was performed following the standard protocol during bolus administration of intravenous contrast. CONTRAST:  1061mISOVUE-300 IOPAMIDOL (ISOVUE-300) INJECTION 61% COMPARISON:  Right upper quadrant ultrasound earlier today FINDINGS: CT CHEST FINDINGS Cardiovascular: Normal heart size. No pericardial effusion. Coronary atherosclerosis. Mild aortic atherosclerosis. Mediastinum/Nodes: Negative for adenopathy. Lungs/Pleura: Mild dependent atelectasis. No consolidation or nodularity Musculoskeletal: No acute or aggressive finding. Generalized spondylosis. CT ABDOMEN PELVIS FINDINGS Hepatobiliary: 4 or 5 indistinct liver masses centered on the right, measuring up to 2.3 cm. There is cholelithiasis and gallbladder wall thickening with mild fat indistinctness. Murphy sign was negative on prior ultrasound. There is intrahepatic biliary duct dilatation above a circumferentially thickened common bile duct. Small subcapsular fluid collection along the right liver without serosal enhancement, favor loculated peritoneal fluid. Pancreas: No evident mass or inflammation Spleen:  Eggshell calcification at the hilum measuring 2.3 cm, compatible with remote insult. Adrenals/Urinary Tract: Negative adrenals. No hydronephrosis or stone. Negative bladder. Stomach/Bowel: No evident obstruction, inflammation, or mass. Vascular/Lymphatic:  Prominent but not strictly enlarged lymph nodes in the deep liver drainage. Reproductive: Negative Other: Small volume ascites. Nodular appearance of the omentum including omentum that is herniated through a fatty umbilical hernia. Musculoskeletal: No acute or aggressive finding. Advanced lumbar facet degeneration. IMPRESSION: 1. 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 Please correlate for cholangitis symptoms. 2. Indistinct right liver masses, favor metastatic disease over early biliary abscesses. 3. Nodular omentum most consistent with peritoneal carcinomatosis. 4. Cholelithiasis. 5. No acute or malignant finding in the chest. Electronically Signed   By: Monte Fantasia M.D.   On: 02/01/2019 06:34   US Abdomen Limited Ruq  Result Date: 02/01/2019 CLINICAL DATA:  Episodic right upper quadrant pain EXAM: ULTRASOUND ABDOMEN LIMITED RIGHT UPPER QUADRANT COMPARISON:  None. FINDINGS: Gallbladder: Gallbladder sludge and multiple shadowing stones, including a stone at the neck. Gallbladder wall is mildly thickened to 5 mm. No pericholecystic edema. Negative sonographic Murphy sign. Common bile duct: Diameter: 8 mm. Where visualized, no filling defect. There is intrahepatic bile duct dilatation Liver: There are 3 solid appearing liver masses seen in the right liver measuring up to 5.8 cm. Portal vein is patent on color Doppler imaging with normal direction of blood flow towards the liver. IMPRESSION: 1. At least 3 liver masses primarily concerning for metastatic disease. Recommend enhanced abdomen and pelvis CT. 2. Cholelithiasis and sludge including stone at the gallbladder neck or cystic duct. The incompletely visualized common bile duct is also dilated and there is intrahepatic biliary dilatation. There could be obstructing stone or mass, attention on follow-up CT. 3. No suspected cholecystitis.  Electronically Signed   By: Monte Fantasia M.D.   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 AST, ALT, alk phos, and bili are elevated. -Oncology consult- plan for US-guided liver biopsy. Unable to do this until Monday, so can be performed as an outpatient if patient is discharged before then -GI consult- plan for ERCP today -IV antiemetics -Pain control -IVFs -Clear liquid diet after ERCP and will advance diet as tolerated  Abnormal UA- UA with moderate leukocytes, but rare bacteria. Patient is asymptomatic, afebrile, and does not have a leukocytosis. -Hold on antibiotics for now -Urine culture pending  Epilepsy- has not been taking meds for the last 3 weeks due to not feeling well. Concern for some seizure-like activity at home. Patient has not had any seizure-like activity here. -Continue home depakote, gabapentin, lamictal, and phenytoin  Type 2 diabetes- on metformin at home -SSI  Hyperlipidemia- stable -Continue home lipitor  Depression- stable -Continue home lexapro  All the records are reviewed and case discussed with ED provider. Management plans discussed with the patient, family and they are in agreement.  CODE STATUS: Full  TOTAL TIME TAKING CARE OF THIS PATIENT: 45 minutes.    Berna Spare  M.D on 02/01/2019 at 7:34 AM  Between 7am to 6pm - Pager - [phone]  After 6pm go to [email or web address] - Proofreader  [facility]  Office  [phone]  CC: Primary care physician; Remi Haggard, FNP   Note: This dictation was prepared with Dragon dictation along with  smaller phrase technology. Any transcriptional errors that result from this process are unintentional. 

## 2019-02-01 NOTE — ED Notes (Signed)
Pt not found in lobby 

## 2019-02-01 NOTE — Consult Note (Signed)
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 Brett Albino  Date of visit: 02/01/2019    History of presenting illness-patient is a 68 year old female with a past medical history significant for seizure disorder for which she is on 3 different antiseizure medications.  She presented to the ER with symptoms of generalized fatigue nasal congestion as well as cough.  She was also seen by primary care doctor a few days ago for symptoms of intermittent abdominal pain which was attributed to gallstones and she was supposed to see surgery for this. Ultrasound of the right upper quadrant in the ER showed 3 liver masses concerning for metastatic disease.  CT was recommended.  Patient had CT chest abdomen and pelvis done in the ER which showed intrahepatic biliary dilatation and thickened enhancing CBD concerning for cholangiocarcinoma.  Indistinct right liver masses favor metastatic disease over early biliary abscesses.  Nodular omentum most consistent with peritoneal carcinomatosis.  Patient was seen by Dr. Allen Norris after she was found to have an elevated AST of 173, ALT 84 alkaline phosphatase 1171 and a total bilirubin of 3.3.  She will be undergoing ERCP today  Currently reports fatigue and abdominal pain. Also reports sob  ECOG PS- 2  Pain scale- 0   Review of systems- Review of Systems  Constitutional: Positive for malaise/fatigue. Negative for chills, fever and weight loss.  HENT: Negative for congestion, ear discharge and nosebleeds.   Eyes: Negative for blurred vision.  Respiratory: Positive for shortness of breath. Negative for cough, hemoptysis, sputum production and wheezing.   Cardiovascular: Negative for chest  pain, palpitations, orthopnea and claudication.  Gastrointestinal: Positive for abdominal pain. Negative for blood in stool, constipation, diarrhea, heartburn, melena, nausea and vomiting.  Genitourinary: Negative for dysuria, flank pain, frequency, hematuria and urgency.  Musculoskeletal: Negative for back pain, joint pain and myalgias.  Skin: Negative for rash.  Neurological: Negative for dizziness, tingling, focal weakness, seizures, weakness and headaches.  Endo/Heme/Allergies: Does not bruise/bleed easily.  Psychiatric/Behavioral: Negative for depression and suicidal ideas. The patient does not have insomnia.     No Known Allergies  Patient Active Problem List   Diagnosis Date Noted  . 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 Gall stones 04/18/2018  . Syncope 02/03/2018  . Epilepsy ([place]) 02/02/2018  . Pain in both upper extremities 09/06/2017  . Anxiety 07/03/2017  . Onychomycosis of left great toe 02/07/2017  . Allergic rhinitis due to allergen 02/07/2017  . Arthralgia of right temporomandibular joint 02/07/2017  . 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 Recurrent major depressive disorder ([place]) 10/24/2015  . Varicose veins 10/24/2015  . Seizures ([place]) 10/24/2015  . Tinea pedis of both feet 10/24/2015  . Insomnia 10/24/2015  . RLS (restless legs syndrome) 10/24/2015  . Avitaminosis D 11/13/2012  . HLD (hyperlipidemia) 08/14/2012     Past Medical History:  Diagnosis Date  . Anxiety   . Colon polyp 11/29/2012   at [place].  . Insomnia   . Mixed hyperlipidemia   . Recurrent major depressive disorder ([place])   . RLS (restless legs syndrome)   . Seizures ([place])   . Tinea pedis of both  feet   . Type 2 diabetes mellitus ([place]) 2014?  [name] Varicose veins      Past Surgical History:  Procedure Laterality Date  . COLONOSCOPY  11/29/2012     Dr [name]  . none      Social History   Socioeconomic History  . Marital status: Widowed    Spouse name: Not on file  . Number of children: Not on file  . Years of education: Not on file  . Highest education level: Not on file  Occupational History  . Not on file  Social Needs  . Financial resource strain: Patient refused  . Food insecurity:    Worry: Patient refused    Inability: Patient refused  . Transportation needs:    Medical: Patient refused    Non-medical: Patient refused  Tobacco Use  . Smoking status: Never Smoker  . Smokeless tobacco: Never Used  Substance and Sexual Activity  . Alcohol use: No    Alcohol/week: 0.0 standard drinks  . Drug use: No  . Sexual activity: Not Currently  Lifestyle  . Physical activity:    Days per week: Patient refused    Minutes per session: Patient refused  . Stress: Patient refused  Relationships  . Social connections:    Talks on phone: Patient refused    Gets together: Patient refused    Attends religious service: Patient refused    Active member of club or organization: Patient refused    Attends meetings of clubs or organizations: Patient refused    Relationship status: Patient refused  . Intimate partner violence:    Fear of current or ex partner: Patient refused    Emotionally abused: Patient refused    Physically abused: Patient refused    Forced sexual activity: Patient refused  Other Topics Concern  . Not on file  Social History Narrative  . Not on file     Family History  Problem Relation Age of Onset  . Diabetes Mother   . Heart disease Mother   . Cancer Father   . Breast cancer Neg Hx      Current Facility-Administered Medications:  .  0.9 %  sodium chloride infusion, , Intravenous, PRN, Hinda Kehr, MD .  0.9 %  sodium chloride infusion, , Intravenous, Continuous, Mayo, Pete Pelt, MD .  acetaminophen (TYLENOL) tablet 650 mg, 650 mg, Oral, Q6H PRN **OR** acetaminophen (TYLENOL) suppository 650  mg, 650 mg, Rectal, Q6H PRN, Mayo, Pete Pelt, MD .  aspirin EC tablet 81 mg, 81 mg, Oral, Daily, Mayo, Pete Pelt, MD .  atorvastatin (LIPITOR) tablet 20 mg, 20 mg, Oral, Daily, Mayo, Pete Pelt, MD .  divalproex (DEPAKOTE SPRINKLE) capsule 250 mg, 250 mg, Oral, QHS, Mayo, Pete Pelt, MD .  divalproex (DEPAKOTE) DR tablet 125 mg, 125 mg, Oral, q morning - 10a, Mayo, Pete Pelt, MD .  enoxaparin (LOVENOX) injection 40 mg, 40 mg, Subcutaneous, Q24H, Mayo, Pete Pelt, MD .  escitalopram (LEXAPRO) tablet 20 mg, 20 mg, Oral, Daily, Mayo, Pete Pelt, MD .  gabapentin (NEURONTIN) capsule 400 mg, 400 mg, Oral, BID, Mayo, Pete Pelt, MD .  indomethacin (INDOCIN) 50 MG suppository 100 mg, 100 mg, Rectal, Once, Allen Norris, Darren, MD .  indomethacin (INDOCIN) 50 MG suppository, , , ,  .  insulin aspart (novoLOG) injection 0-5 Units, 0-5 Units, Subcutaneous, QHS, Mayo, Pete Pelt, MD .  insulin aspart (novoLOG) injection 0-9 Units, 0-9 Units, Subcutaneous, TID WC, Mayo, Pete Pelt, MD .  lamoTRIgine (LAMICTAL) tablet 150 mg, 150  mg, Oral, BID, Mayo, Pete Pelt, MD .  morphine 4 MG/ML injection 4 mg, 4 mg, Intravenous, Q3H PRN, Mayo, Pete Pelt, MD .  multivitamin with minerals tablet 1 tablet, 1 tablet, Oral, Daily, Mayo, Pete Pelt, MD .  ondansetron [facility]) tablet 4 mg, 4 mg, Oral, Q6H PRN **OR** ondansetron (ZOFRAN) injection 4 mg, 4 mg, Intravenous, Q6H PRN, Mayo, Pete Pelt, MD .  oxyCODONE (Oxy IR/ROXICODONE) immediate release tablet 5 mg, 5 mg, Oral, Q4H PRN, Mayo, Pete Pelt, MD .  pantoprazole (PROTONIX) EC tablet 40 mg, 40 mg, Oral, Daily, Mayo, Pete Pelt, MD .  phenytoin (DILANTIN) ER capsule 200 mg, 200 mg, Oral, BID, Mayo, Pete Pelt, MD .  polyethylene glycol (MIRALAX / GLYCOLAX) packet 17 g, 17 g, Oral, Daily PRN, Sela Hua, MD   Physical exam:  Vitals:   02/01/19 0630 02/01/19 0730 02/01/19 0938 02/01/19 1008  BP: 130/63 (!) 155/69 119/67 121/61  Pulse: 86 64 88 85  Resp: (!) 21 (!) 22 20 20   Temp:     98.2 F (36.8 C)  TempSrc:    Oral  SpO2: 90% 93% 93% 92%  Weight:    189 lb 12.8 oz (86.1 kg)  Height:    5\' 4"  (1.626 m)   Physical Exam Constitutional:      Comments: Appears fatigued. On O2  HENT:     Head: Normocephalic and atraumatic.  Eyes:     Pupils: Pupils are equal, round, and reactive to light.  Neck:     Musculoskeletal: Normal range of motion.  Cardiovascular:     Rate and Rhythm: Normal rate and regular rhythm.     Heart sounds: Normal heart sounds.  Pulmonary:     Effort: Pulmonary effort is normal.     Breath sounds: Normal breath sounds.  Abdominal:     General: Bowel sounds are normal. There is no distension.     Palpations: Abdomen is soft.     Comments: TTP RUQ  Skin:    General: Skin is warm and dry.  Neurological:     Mental Status: She is alert and oriented to person, place, and time.        CMP Latest Ref Rng & Units 01/31/2019  Glucose 70 - 99 mg/dL 198(H)  BUN 8 - 23 mg/dL 13  Creatinine 0.44 - 1.00 mg/dL 0.76  Sodium 135 - 145 mmol/L 136  Potassium 3.5 - 5.1 mmol/L 4.1  Chloride 98 - 111 mmol/L 98  CO2 22 - 32 mmol/L 29  Calcium 8.9 - 10.3 mg/dL 8.4(L)  Total Protein 6.5 - 8.1 g/dL 7.2  Total Bilirubin 0.3 - 1.2 mg/dL 3.3(H)  Alkaline Phos 38 - 126 U/L 1,171(H)  AST 15 - 41 U/L 173(H)  ALT 0 - 44 U/L 84(H)   CBC Latest Ref Rng & Units 01/31/2019  WBC 4.0 - 10.5 K/uL 6.7  Hemoglobin 12.0 - 15.0 g/dL 12.2  Hematocrit 36.0 - 46.0 % 39.4  Platelets 150 - 400 K/uL 409(H)    @IMAGES @  Ct Chest W Contrast  Result Date: 02/01/2019 CLINICAL DATA:  Cough with persistent shortness of breath. EXAM: CT CHEST, ABDOMEN, AND PELVIS WITH CONTRAST TECHNIQUE: Multidetector CT imaging of the chest, abdomen and pelvis was performed following the standard protocol during bolus administration of intravenous contrast. CONTRAST:  148mL ISOVUE-300 IOPAMIDOL (ISOVUE-300) INJECTION 61% COMPARISON:  Right upper quadrant ultrasound earlier today FINDINGS: CT  CHEST FINDINGS Cardiovascular: Normal heart size. No pericardial effusion. Coronary atherosclerosis. Mild aortic atherosclerosis. Mediastinum/Nodes:  Negative for adenopathy. Lungs/Pleura: Mild dependent atelectasis. No consolidation or nodularity Musculoskeletal: No acute or aggressive finding. Generalized spondylosis. CT ABDOMEN PELVIS FINDINGS Hepatobiliary: 4 or 5 indistinct liver masses centered on the right, measuring up to 2.3 cm. There is cholelithiasis and gallbladder wall thickening with mild fat indistinctness. Murphy sign was negative on prior ultrasound. There is intrahepatic biliary duct dilatation above a circumferentially thickened common bile duct. Small subcapsular fluid collection along the right liver without serosal enhancement, favor loculated peritoneal fluid. Pancreas: No evident mass or inflammation Spleen: Eggshell calcification at the hilum measuring 2.3 cm, compatible with remote insult. Adrenals/Urinary Tract: Negative adrenals. No hydronephrosis or stone. Negative bladder. Stomach/Bowel: No evident obstruction, inflammation, or mass. Vascular/Lymphatic: Prominent but not strictly enlarged lymph nodes in the deep liver drainage. Reproductive: Negative Other: Small volume ascites. Nodular appearance of the omentum including omentum that is herniated through a fatty umbilical hernia. Musculoskeletal: No acute or aggressive finding. Advanced lumbar facet degeneration. IMPRESSION: 1. 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 Please correlate for cholangitis symptoms. 2. Indistinct right liver masses, favor metastatic disease over early biliary abscesses. 3. Nodular omentum most consistent with peritoneal carcinomatosis. 4. Cholelithiasis. 5. No acute or malignant finding in the chest. Electronically Signed   By: Monte Fantasia M.D.   On: 02/01/2019 06:34   Ct Abdomen Pelvis W  Contrast  Result Date: 02/01/2019 CLINICAL DATA:  Cough with persistent shortness of breath. EXAM: CT CHEST, ABDOMEN, AND PELVIS WITH CONTRAST TECHNIQUE: Multidetector CT imaging of the chest, abdomen and pelvis was performed following the standard protocol during bolus administration of intravenous contrast. CONTRAST:  177mL ISOVUE-300 IOPAMIDOL (ISOVUE-300) INJECTION 61% COMPARISON:  Right upper quadrant ultrasound earlier today FINDINGS: CT CHEST FINDINGS Cardiovascular: Normal heart size. No pericardial effusion. Coronary atherosclerosis. Mild aortic atherosclerosis. Mediastinum/Nodes: Negative for adenopathy. Lungs/Pleura: Mild dependent atelectasis. No consolidation or nodularity Musculoskeletal: No acute or aggressive finding. Generalized spondylosis. CT ABDOMEN PELVIS FINDINGS Hepatobiliary: 4 or 5 indistinct liver masses centered on the right, measuring up to 2.3 cm. There is cholelithiasis and gallbladder wall thickening with mild fat indistinctness. Murphy sign was negative on prior ultrasound. There is intrahepatic biliary duct dilatation above a circumferentially thickened common bile duct. Small subcapsular fluid collection along the right liver without serosal enhancement, favor loculated peritoneal fluid. Pancreas: No evident mass or inflammation Spleen: Eggshell calcification at the hilum measuring 2.3 cm, compatible with remote insult. Adrenals/Urinary Tract: Negative adrenals. No hydronephrosis or stone. Negative bladder. Stomach/Bowel: No evident obstruction, inflammation, or mass. Vascular/Lymphatic: Prominent but not strictly enlarged lymph nodes in the deep liver drainage. Reproductive: Negative Other: Small volume ascites. Nodular appearance of the omentum including omentum that is herniated through a fatty umbilical hernia. Musculoskeletal: No acute or aggressive finding. Advanced lumbar facet degeneration. IMPRESSION: 1. Intrahepatic biliary dilatation of above a thickened enhancing common  bile duct primarily concerning for cholangiocarcinoma given constellation of findings. Please correlate for cholangitis symptoms. 2. Indistinct right liver masses, favor metastatic disease over early biliary abscesses. 3. Nodular omentum most consistent with peritoneal carcinomatosis. 4. Cholelithiasis. 5. No acute or malignant finding in the chest. Electronically Signed   By: Monte Fantasia M.D.   On: 02/01/2019 06:34   US Abdomen Limited Ruq  Result Date: 02/01/2019 CLINICAL DATA:  Episodic right upper quadrant pain EXAM: ULTRASOUND ABDOMEN LIMITED RIGHT UPPER QUADRANT COMPARISON:  None. FINDINGS: Gallbladder: Gallbladder sludge and multiple shadowing stones, including a stone at the neck. Gallbladder  wall is mildly thickened to 5 mm. No pericholecystic edema. Negative sonographic Murphy sign. Common bile duct: Diameter: 8 mm. Where visualized, no filling defect. There is intrahepatic bile duct dilatation Liver: There are 3 solid appearing liver masses seen in the right liver measuring up to 5.8 cm. Portal vein is patent on color Doppler imaging with normal direction of blood flow towards the liver. IMPRESSION: 1. At least 3 liver masses primarily concerning for metastatic disease. Recommend enhanced abdomen and pelvis CT. 2. Cholelithiasis and sludge including stone at the gallbladder neck or cystic duct. The incompletely visualized common bile duct is also dilated and there is intrahepatic biliary dilatation. There could be obstructing stone or mass, attention on follow-up CT. 3. No suspected cholecystitis. Electronically Signed   By: Monte Fantasia M.D.   On: 02/01/2019 05:48    Assessment and plan- Patient is a 68 y.o. 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  I will  check hepatitis B and C serologies as well as CA-19-9.  I have reviewed CT chest abdomen and pelvis images independently and discussed findings with the patient.  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  I will discuss chemotherapy in more detail after the results of her pathology are back.  Given the findings of liver masses and peritoneal carcinomatosis this is stage IV disease and therefore treatment will be given with palliative intent. Spoke to Dr. Golden Circle from IR given uncertainty between abscess versus metastases in the liver. She is clinically asymptomatic. No leucocytosis, tachycardia or fever. Metastases is favored. But will need a liver biopsy. They are unable to do biopsy before Monday. If she is discharged before that I will arrange biopsy as an outpatient. I will f/u with her as an outpatient after pathology is back   Visit Diagnosis 1. Biliary obstruction   2. Seizure disorder ([place])   3. Cholangiocarcinoma ([place])   4. Peritoneal carcinomatosis ([place])   5. Acute respiratory failure with hypoxemia ([facility])   6. Urinary tract infection without hematuria, site unspecified   7. Nausea and vomiting, intractability of vomiting not specified, unspecified vomiting type     Dr. Randa Evens, MD, MPH [facility] [phone] 02/01/2019 3:35 PM

## 2019-02-01 NOTE — Transfer of Care (Signed)
Immediate Anesthesia Transfer of Care Note  Patient: Kaylee Wolf  Procedure(s) Performed: ENDOSCOPIC RETROGRADE CHOLANGIOPANCREATOGRAPHY (ERCP) (N/A )  Patient Location: PACU  Anesthesia Type:General  Level of Consciousness: drowsy  Airway & Oxygen Therapy: Patient Spontanous Breathing  Post-op Assessment: Report given to RN  Post vital signs: Reviewed and stable  Last Vitals:  Vitals Value Taken Time  BP 130/72 02/01/2019  1:34 PM  Temp    Pulse 87 02/01/2019  1:36 PM  Resp 16 02/01/2019  1:36 PM  SpO2 97 % 02/01/2019  1:36 PM  Vitals shown include unvalidated device data.  Last Pain:  Vitals:   02/01/19 1319  TempSrc:   PainSc: 0-No pain         Complications: No apparent anesthesia complications

## 2019-02-01 NOTE — H&P (Signed)
[name], MD [address] [place], [place] [address] Phone:[phone] Fax : [phone]  Primary Care Physician:  Remi Haggard, FNP Primary Gastroenterologist:  Dr. Allen Norris  Pre-Procedure History & Physical: HPI:  Kaylee Wolf is a 68 y.o. female is here for an ERCP.   Past Medical History:  Diagnosis Date  . Anxiety   . Colon polyp 11/29/2012   at [place].  . Insomnia   . Mixed hyperlipidemia   . Recurrent major depressive disorder ([place])   . RLS (restless legs syndrome)   . Seizures ([place])   . Tinea pedis of both feet   . Type 2 diabetes mellitus ([place]) 2014?  [name] Varicose veins     Past Surgical History:  Procedure Laterality Date  . COLONOSCOPY  11/29/2012   Dr [name]  . none      Prior to Admission medications   Medication Sig Start Date End Date Taking? Authorizing Provider  aspirin EC 81 MG EC tablet Take 1 tablet (81 mg total) by mouth daily. 02/03/18  Yes Wieting, Richard, MD  atorvastatin (LIPITOR) 20 MG tablet Take 1 tablet (20 mg total) by mouth daily. 09/06/17  Yes Karamalegos, Devonne Doughty, DO  blood glucose meter kit and supplies KIT Dispense based on patient and insurance preference. Check blood glucose once daily. 11/25/15  Yes Krebs, Amy Lauren, NP  Cholecalciferol (VITAMIN D-3) 1000 UNITS CAPS Take 1 capsule by mouth daily.    Yes [provider]  divalproex (DEPAKOTE) 125 MG DR tablet Take 1 pill in am and 2 pill at night 02/21/17  Yes Rainey Pines, MD  escitalopram (LEXAPRO) 20 MG tablet Take 1 tablet (20 mg total) by mouth daily. 09/06/17  Yes Karamalegos, Devonne Doughty, DO  gabapentin (NEURONTIN) 400 MG capsule Take 1 capsule (400 mg total) by mouth 2 (two) times daily. 09/06/17  Yes Karamalegos, Devonne Doughty, DO  lamoTRIgine (LAMICTAL) 150 MG tablet Take 150 mg by mouth 2 (two) times daily.  07/20/16  Yes [provider]  linaclotide (LINZESS) 72 MCG capsule Take 72 mcg by  mouth as needed.   Yes [provider]  metFORMIN (GLUCOPHAGE) 500 MG tablet Take 1 tablet (500 mg total) by mouth 2 (two) times daily. 09/06/17  Yes Karamalegos, Devonne Doughty, DO  Multiple Vitamin (MULTIVITAMIN) tablet Take 1 tablet by mouth daily.   Yes [provider]  omeprazole (PRILOSEC) 20 MG capsule Take 1 capsule (20 mg total) by mouth daily. Before 30 min before first meal of day 09/06/17  Yes Karamalegos, Alexander J, DO  ondansetron (ZOFRAN-ODT) 4 MG disintegrating tablet Take 1 tablet (4 mg total) by mouth 2 (two) times daily. 09/06/17  Yes Karamalegos, Devonne Doughty, DO  phenytoin (DILANTIN) 200 MG ER capsule Take 200 mg by mouth 2 (two) times daily.  11/17/15  Yes [provider]    Allergies as of 01/31/2019  . (No Known Allergies)    Family History  Problem Relation Age of Onset  . Diabetes Mother   . Heart disease Mother   . Cancer Father   . Breast cancer Neg Hx     Social History   Socioeconomic History  . Marital status: Widowed    Spouse name: Not on file  . Number of children: Not on file  . Years of education: Not on file  . Highest education level: Not on file  Occupational History  . Not on file  Social Needs  .  Financial resource strain: Patient refused  . Food insecurity:    Worry: Patient refused    Inability: Patient refused  . Transportation needs:    Medical: Patient refused    Non-medical: Patient refused  Tobacco Use  . Smoking status: Never Smoker  . Smokeless tobacco: Never Used  Substance and Sexual Activity  . Alcohol use: No    Alcohol/week: 0.0 standard drinks  . Drug use: No  . Sexual activity: Not Currently  Lifestyle  . Physical activity:    Days per week: Patient refused    Minutes per session: Patient refused  . Stress: Patient refused  Relationships  . Social connections:    Talks on phone: Patient refused    Gets together: Patient refused    Attends religious service: Patient refused    Active  member of club or organization: Patient refused    Attends meetings of clubs or organizations: Patient refused    Relationship status: Patient refused  . Intimate partner violence:    Fear of current or ex partner: Patient refused    Emotionally abused: Patient refused    Physically abused: Patient refused    Forced sexual activity: Patient refused  Other Topics Concern  . Not on file  Social History Narrative  . Not on file    Review of Systems: See HPI, otherwise negative ROS  Physical Exam: BP 121/61 (BP Location: Right Arm)   Pulse 85   Temp 98.2 F (36.8 C) (Oral)   Resp 20   Ht '5\' 4"'  (1.626 m)   Wt 86.1 kg   LMP 05/10/2012   SpO2 92%   BMI 32.58 kg/m  General:   Alert,  pleasant and cooperative in NAD Head:  Normocephalic and atraumatic. Neck:  Supple; no masses or thyromegaly. Lungs:  Clear throughout to auscultation.    Heart:  Regular rate and rhythm. Abdomen:  Soft, nontender and nondistended. Normal bowel sounds, without guarding, and without rebound.   Neurologic:  Alert and  oriented x4;  grossly normal neurologically.  Impression/Plan: Kaylee Wolf is here for an ERCP to be performed for CBD obstruction  Risks, benefits, limitations, and alternatives regarding  ERCP have been reviewed with the patient.  Questions have been answered.  All parties agreeable.   [name], MD  02/01/2019, 12:48 PM

## 2019-02-01 NOTE — Progress Notes (Signed)
   02/01/19 1600  Clinical Encounter Type  Visited With Patient and family together  Visit Type Initial  Stress Factors  Patient Stress Factors Exhausted;Health changes  Family Stress Factors Exhausted;Health changes  Ch visited the pt. Pt was with her two daughters all of whom were exhausted from being in the hospital since last night. Family shared that they found something in the pt's body that could be cancer. Family brought pt to the hospital for different reasons and now are dealing with this uncertainty. Ch stayed present with them but because pt was kept busy with nurse visits and ordering dinner, left the room to give more space. Follow up recommended.

## 2019-02-01 NOTE — Anesthesia Procedure Notes (Signed)
Procedure Name: Intubation Date/Time: 02/01/2019 12:17 PM Performed by: Fredderick Phenix, CRNA Pre-anesthesia Checklist: Patient identified, Emergency Drugs available, Suction available, Patient being monitored and Timeout performed Patient Re-evaluated:Patient Re-evaluated prior to induction Oxygen Delivery Method: Circle system utilized Preoxygenation: Pre-oxygenation with 100% oxygen Induction Type: IV induction Ventilation: Mask ventilation without difficulty Laryngoscope Size: Mac and 4 Grade View: Grade I Tube type: Oral Tube size: 7.0 mm Number of attempts: 1 Placement Confirmation: ETT inserted through vocal cords under direct vision,  positive ETCO2 and breath sounds checked- equal and bilateral Secured at: 21 cm Tube secured with: Tape

## 2019-02-01 NOTE — Op Note (Signed)
[facility] Gastroenterology Patient Name: Kaylee Wolf Procedure Date: 02/01/2019 11:47 AM MRN: [id number] Account #: [id number] Date of Birth: 03-28-1951 Admit Type: Inpatient Age: 68 Room: [facility] ENDO ROOM 4 Gender: Female Note Status: Finalized Procedure:            ERCP Indications:          Common bile duct stricture, Jaundice Providers:            [name] MD, MD Medicines:            General Anesthesia Complications:        No immediate complications. Procedure:            Pre-Anesthesia Assessment:                       - Prior to the procedure, a History and Physical was                        performed, and patient medications and allergies were                        reviewed. The patient's tolerance of previous                        anesthesia was also reviewed. The risks and benefits of                        the procedure and the sedation options and risks were                        discussed with the patient. All questions were                        answered, and informed consent was obtained. Prior                        Anticoagulants: The patient has taken no previous                        anticoagulant or antiplatelet agents. ASA Grade                        Assessment: II - A patient with mild systemic disease.                        After reviewing the risks and benefits, the patient was                        deemed in satisfactory condition to undergo the                        procedure.                       After obtaining informed consent, the scope was passed                        under direct vision. Throughout the procedure, the                        patient's blood pressure, pulse,  and oxygen saturations                        were monitored continuously. The Duodenoscope was                        introduced through the mouth, and used to inject                        contrast into and used to inject contrast into the bile                         duct. The ERCP was accomplished without difficulty. The                        patient tolerated the procedure well. Findings:      The scout film was normal. The esophagus was successfully intubated       under direct vision. The scope was advanced to a normal major papilla in       the descending duodenum without detailed examination of the pharynx,       larynx and associated structures, and upper GI tract. The upper GI tract       was grossly normal. The bile duct was deeply cannulated with the       short-nosed traction sphincterotome. Contrast was injected. I personally       interpreted the bile duct images. There was brisk flow of contrast       through the ducts. Image quality was excellent. Contrast extended to the       entire biliary tree. The upper third of the main bile duct contained a       single segmental stenosis 12 mm in length. The left and right hepatic       ducts and all intrahepatic branches were diffusely dilated, acquired. A       wire was passed into the biliary tree. A 5 mm biliary sphincterotomy was       made with a traction (standard) sphincterotome using ERBE       electrocautery. There was no post-sphincterotomy bleeding. Cells for       cytology were obtained by brushing in the upper third of the main bile       duct. One 10 Fr by 9 cm plastic stent with a single external flap and a       single internal flap was placed 7 cm into the common bile duct. Bile       flowed through the stent. The stent was in good position. Impression:           - A single segmental biliary stricture was found in the                        upper third of the main bile duct. The stricture was                        malignant appearing.                       - The left and right hepatic ducts and all intrahepatic                        branches  were dilated, acquired.                       - A biliary sphincterotomy was performed.                       -  Cells for cytology obtained in the upper third of the                        main bile duct.                       - One plastic stent was placed into the common bile                        duct. Recommendation:       - Return patient to hospital ward for ongoing care.                       - Clear liquid diet today. Procedure Code(s):    --- Professional ---                       [phone], Endoscopic retrograde cholangiopancreatography                        (ERCP); with placement of endoscopic stent into biliary                        or pancreatic duct, including pre- and post-dilation                        and guide wire passage, when performed, including                        sphincterotomy, when performed, each stent                       19622, Endoscopic catheterization of the biliary ductal                        system, radiological supervision and interpretation Diagnosis Code(s):    --- Professional ---                       R17, Unspecified jaundice                       K83.8, Other specified diseases of biliary tract                       K83.1, Obstruction of bile duct CPT copyright 2018 [employer]. All rights reserved. The codes documented in this report are preliminary and upon coder review may  be revised to meet current compliance requirements. [name] MD, MD 02/01/2019 12:57:14 PM This report has been signed electronically. Number of Addenda: 0 Note Initiated On: 02/01/2019 11:47 AM      [facility]

## 2019-02-01 NOTE — ED Provider Notes (Signed)
[facility] Emergency Department Provider Note  ____________________________________________   First MD Initiated Contact with Patient 02/01/19 [phone]     (approximate)  I have reviewed the triage vital signs and the nursing notes.   HISTORY  Chief Complaint Nasal Congestion and Seizures    HPI Kaylee Wolf is a 68 y.o. female with medical history as listed below which notably includes seizures on 3 different antiepileptic medications (phenytoin, valproic acid, and lamotrigine).  She reports that she and her daughter, who is at bedside, have been trading off a viral illness for the last week or so.  She states that as result of not feeling well from the viral illness with nasal congestion, general malaise, runny nose, cough, etc., she has not been taking her antiepileptic medications for at least 2 days.  She has had a couple of episodes of some shaking behavior and some increased sleeping that she associates with her seizures.  She is in no distress at this time.  She denies abdominal pain, nausea, vomiting, fever/chills, and dysuria.  She has had episodic abdominal pain recently including some nausea and vomiting and she was told a couple of days ago by her primary care provider that she has gallstones and needs to see a surgeon, but she is not currently in pain.  Nothing in particular makes her symptoms better or worse.  They were mostly worried about her seizures in the setting of not taking her medication recently.  Past Medical History:  Diagnosis Date  . Anxiety   . Colon polyp 11/29/2012   at [facility].  . Insomnia   . Mixed hyperlipidemia   . Recurrent major depressive disorder ([place])   . RLS (restless legs syndrome)   . Seizures ([place])   . Tinea pedis of both feet   . Type 2 diabetes mellitus ([place]) 2014?  [name] Varicose veins     Patient Active Problem List   Diagnosis Date Noted  . Gall stones  04/18/2018  . Syncope 02/03/2018  . Epilepsy ([place]) 02/02/2018  . Pain in both upper extremities 09/06/2017  . Anxiety 07/03/2017  . Onychomycosis of left great toe 02/07/2017  . Allergic rhinitis due to allergen 02/07/2017  . Arthralgia of right temporomandibular joint 02/07/2017  . 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 Recurrent major depressive disorder ([place]) 10/24/2015  . Varicose veins 10/24/2015  . Seizures ([place]) 10/24/2015  . Tinea pedis of both feet 10/24/2015  . Insomnia 10/24/2015  . RLS (restless legs syndrome) 10/24/2015  . Avitaminosis D 11/13/2012  . HLD (hyperlipidemia) 08/14/2012    Past Surgical History:  Procedure Laterality Date  . COLONOSCOPY  11/29/2012   Dr [name]  . none      Prior to Admission medications   Medication Sig Start Date End Date Taking? Authorizing Provider  aspirin EC 81 MG EC tablet Take 1 tablet (81 mg total) by mouth daily. 02/03/18   Loletha Grayer, MD  atorvastatin (LIPITOR) 20 MG tablet Take 1 tablet (20 mg total) by mouth daily. 09/06/17   Karamalegos, Devonne Doughty, DO  bisacodyl (DULCOLAX) 5 MG EC tablet Take 2 tablets in the morning and 2 tablets in the evening 2 days prior to your colonoscopy 12/05/18   Byrnett, Forest Gleason, MD  blood glucose meter kit and supplies KIT Dispense based on patient and insurance preference. Check blood glucose once daily.  11/25/15   Luciana Axe, NP  Cholecalciferol (VITAMIN D-3) 1000 UNITS CAPS Take 1 capsule by mouth daily.     [provider]  divalproex (DEPAKOTE) 125 MG DR tablet Take 1 pill in am and 2 pill at night 02/21/17   Rainey Pines, MD  escitalopram (LEXAPRO) 20 MG tablet Take 1 tablet (20 mg total) by mouth daily. 09/06/17   Karamalegos, Devonne Doughty, DO  gabapentin (NEURONTIN) 400 MG capsule Take 1 capsule (400 mg total) by mouth 2 (two) times daily. 09/06/17   Karamalegos, Devonne Doughty, DO   lamoTRIgine (LAMICTAL) 150 MG tablet Take 150 mg by mouth 2 (two) times daily.  07/20/16   [provider]  linaclotide (LINZESS) 72 MCG capsule Take 72 mcg by mouth as needed.    [provider]  metFORMIN (GLUCOPHAGE) 500 MG tablet Take 1 tablet (500 mg total) by mouth 2 (two) times daily. 09/06/17   Karamalegos, Devonne Doughty, DO  Multiple Vitamin (MULTIVITAMIN) tablet Take 1 tablet by mouth daily.    [provider]  omeprazole (PRILOSEC) 20 MG capsule Take 1 capsule (20 mg total) by mouth daily. Before 30 min before first meal of day 09/06/17   Olin Hauser, DO  ondansetron (ZOFRAN-ODT) 4 MG disintegrating tablet Take 1 tablet (4 mg total) by mouth 2 (two) times daily. 09/06/17   Karamalegos, Devonne Doughty, DO  phenytoin (DILANTIN) 200 MG ER capsule Take 200 mg by mouth 2 (two) times daily.  11/17/15   [provider]  polyethylene glycol powder (GLYCOLAX/MIRALAX) powder Mix whole container with 64 ounces of clear liquids, No Red liquids. 12/05/18   Robert Bellow, MD    Allergies Patient has no known allergies.  Family History  Problem Relation Age of Onset  . Diabetes Mother   . Heart disease Mother   . Cancer Father   . Breast cancer Neg Hx     Social History Social History   Tobacco Use  . Smoking status: Never Smoker  . Smokeless tobacco: Never Used  Substance Use Topics  . Alcohol use: No    Alcohol/week: 0.0 standard drinks  . Drug use: No    Review of Systems Constitutional: No fever/chills Eyes: No visual changes. ENT: Nasal congestion and runny nose. Cardiovascular: Denies chest pain. Respiratory: Denies shortness of breath. Gastrointestinal: No current abdominal pain but recent episodic abdominal pain with nausea and vomiting. No diarrhea.  No constipation. Genitourinary: Negative for dysuria. Musculoskeletal: Negative for neck pain.  Negative for back pain. Integumentary: Negative for rash. Neurological:  Intermittent focal seizures consistent with her usual episodes.  No numbness, tingling, nor weakness in any of her extremities.   ____________________________________________   PHYSICAL EXAM:  VITAL SIGNS: ED Triage Vitals  Enc Vitals Group     BP 01/31/19 2154 124/68     Pulse Rate 01/31/19 2154 (!) 112     Resp 01/31/19 2154 19     Temp 01/31/19 2154 97.7 F (36.5 C)     Temp Source 01/31/19 2154 Oral     SpO2 01/31/19 2154 95 %     Weight 01/31/19 2154 86.2 kg (190 lb)     Height 01/31/19 2154 1.626 m ('5\' 4"' )     Head Circumference --      Peak Flow --      Pain Score 01/31/19 2200 0     Pain Loc --      Pain Edu? --      Excl. in [place]? --  Constitutional: Alert and oriented. Well appearing and in no acute distress. Eyes: Conjunctivae are suggestive of mild scleral icterus. Head: Atraumatic. Nose: No congestion/rhinnorhea. Mouth/Throat: Mucous membranes are moist. Neck: No stridor.  No meningeal signs.   Cardiovascular: Normal rate, regular rhythm. Good peripheral circulation. Grossly normal heart sounds. Respiratory: Normal respiratory effort.  No retractions. Lungs CTAB. Gastrointestinal: Abdomen is rotund but nontender to palpation.   Musculoskeletal: No lower extremity tenderness nor edema. No gross deformities of extremities. Neurologic:  Normal speech and language. No gross focal neurologic deficits are appreciated.  Skin:  Skin is warm, dry and intact. No rash noted.  Pallor, subtle jaundice is possible. Psychiatric: Mood and affect are normal. Speech and behavior are normal.  ____________________________________________   LABS (all labs ordered are listed, but only abnormal results are displayed)  Labs Reviewed  GLUCOSE, CAPILLARY - Abnormal; Notable for the following components:      Result Value   Glucose-Capillary 144 (*)    All other components within normal limits  URINALYSIS, COMPLETE (UACMP) WITH MICROSCOPIC - Abnormal; Notable for the following  components:   Color, Urine AMBER (*)    APPearance HAZY (*)    Hgb urine dipstick SMALL (*)    Bilirubin Urine MODERATE (*)    Protein, ur 30 (*)    Leukocytes, UA MODERATE (*)    Bacteria, UA RARE (*)    Non Squamous Epithelial PRESENT (*)    All other components within normal limits  CBC WITH DIFFERENTIAL/PLATELET - Abnormal; Notable for the following components:   RDW 16.5 (*)    Platelets 409 (*)    All other components within normal limits  COMPREHENSIVE METABOLIC PANEL - Abnormal; Notable for the following components:   Glucose, Bld 198 (*)    Calcium 8.4 (*)    Albumin 2.8 (*)    AST 173 (*)    ALT 84 (*)    Alkaline Phosphatase 1,171 (*)    Total Bilirubin 3.3 (*)    All other components within normal limits  VALPROIC ACID LEVEL - Abnormal; Notable for the following components:   Valproic Acid Lvl 28 (*)    All other components within normal limits  PHENYTOIN LEVEL, TOTAL - Abnormal; Notable for the following components:   Phenytoin Lvl <2.5 (*)    All other components within normal limits  URINE CULTURE  CBG MONITORING, ED   ____________________________________________  EKG  No indication for EKG ____________________________________________  RADIOLOGY   ED MD interpretation: Ultrasound does demonstrate cholelithiasis and a dilated common bile duct but is also concerning for neoplasm and there was a recommendation for CT scan of the abdomen and pelvis.  The CT scan of the chest with IV contrast did not demonstrate any acute abnormalities, but the CT scan of the abdomen and pelvis shows biliary dilatation and concern for cholangiocarcinoma as well as peritoneal carcinomatosis based on omental thickening.  Official radiology report(s): Ct Chest W Contrast  Result Date: 02/01/2019 CLINICAL DATA:  Cough with persistent shortness of breath. EXAM: CT CHEST, ABDOMEN, AND PELVIS WITH CONTRAST TECHNIQUE: Multidetector CT imaging of the chest, abdomen and pelvis was  performed following the standard protocol during bolus administration of intravenous contrast. CONTRAST:  151m ISOVUE-300 IOPAMIDOL (ISOVUE-300) INJECTION 61% COMPARISON:  Right upper quadrant ultrasound earlier today FINDINGS: CT CHEST FINDINGS Cardiovascular: Normal heart size. No pericardial effusion. Coronary atherosclerosis. Mild aortic atherosclerosis. Mediastinum/Nodes: Negative for adenopathy. Lungs/Pleura: Mild dependent atelectasis. No consolidation or nodularity Musculoskeletal: No acute or aggressive finding. Generalized spondylosis. CT  ABDOMEN PELVIS FINDINGS Hepatobiliary: 4 or 5 indistinct liver masses centered on the right, measuring up to 2.3 cm. There is cholelithiasis and gallbladder wall thickening with mild fat indistinctness. Murphy sign was negative on prior ultrasound. There is intrahepatic biliary duct dilatation above a circumferentially thickened common bile duct. Small subcapsular fluid collection along the right liver without serosal enhancement, favor loculated peritoneal fluid. Pancreas: No evident mass or inflammation Spleen: Eggshell calcification at the hilum measuring 2.3 cm, compatible with remote insult. Adrenals/Urinary Tract: Negative adrenals. No hydronephrosis or stone. Negative bladder. Stomach/Bowel: No evident obstruction, inflammation, or mass. Vascular/Lymphatic: Prominent but not strictly enlarged lymph nodes in the deep liver drainage. Reproductive: Negative Other: Small volume ascites. Nodular appearance of the omentum including omentum that is herniated through a fatty umbilical hernia. Musculoskeletal: No acute or aggressive finding. Advanced lumbar facet degeneration. IMPRESSION: 1. 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 Please correlate for cholangitis symptoms. 2. Indistinct right liver masses, favor metastatic disease over early biliary  abscesses. 3. Nodular omentum most consistent with peritoneal carcinomatosis. 4. Cholelithiasis. 5. No acute or malignant finding in the chest. Electronically Signed   By: Monte Fantasia M.D.   On: 02/01/2019 06:34   Ct Abdomen Pelvis W Contrast  Result Date: 02/01/2019 CLINICAL DATA:  Cough with persistent shortness of breath. EXAM: CT CHEST, ABDOMEN, AND PELVIS WITH CONTRAST TECHNIQUE: Multidetector CT imaging of the chest, abdomen and pelvis was performed following the standard protocol during bolus administration of intravenous contrast. CONTRAST:  123m ISOVUE-300 IOPAMIDOL (ISOVUE-300) INJECTION 61% COMPARISON:  Right upper quadrant ultrasound earlier today FINDINGS: CT CHEST FINDINGS Cardiovascular: Normal heart size. No pericardial effusion. Coronary atherosclerosis. Mild aortic atherosclerosis. Mediastinum/Nodes: Negative for adenopathy. Lungs/Pleura: Mild dependent atelectasis. No consolidation or nodularity Musculoskeletal: No acute or aggressive finding. Generalized spondylosis. CT ABDOMEN PELVIS FINDINGS Hepatobiliary: 4 or 5 indistinct liver masses centered on the right, measuring up to 2.3 cm. There is cholelithiasis and gallbladder wall thickening with mild fat indistinctness. Murphy sign was negative on prior ultrasound. There is intrahepatic biliary duct dilatation above a circumferentially thickened common bile duct. Small subcapsular fluid collection along the right liver without serosal enhancement, favor loculated peritoneal fluid. Pancreas: No evident mass or inflammation Spleen: Eggshell calcification at the hilum measuring 2.3 cm, compatible with remote insult. Adrenals/Urinary Tract: Negative adrenals. No hydronephrosis or stone. Negative bladder. Stomach/Bowel: No evident obstruction, inflammation, or mass. Vascular/Lymphatic: Prominent but not strictly enlarged lymph nodes in the deep liver drainage. Reproductive: Negative Other: Small volume ascites. Nodular appearance of the omentum  including omentum that is herniated through a fatty umbilical hernia. Musculoskeletal: No acute or aggressive finding. Advanced lumbar facet degeneration. IMPRESSION: 1. 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 Please correlate for cholangitis symptoms. 2. Indistinct right liver masses, favor metastatic disease over early biliary abscesses. 3. Nodular omentum most consistent with peritoneal carcinomatosis. 4. Cholelithiasis. 5. No acute or malignant finding in the chest. Electronically Signed   By: JMonte FantasiaM.D.   On: 02/01/2019 06:34   [place]dom[place] Limited Ruq  Result Date: 02/01/2019 CLINICAL DATA:  Episodic right upper quadrant pain EXAM: ULTRASOUND ABDOMEN LIMITED RIGHT UPPER QUADRANT COMPARISON:  None. FINDINGS: Gallbladder: Gallbladder sludge and multiple shadowing stones, including a stone at the neck. Gallbladder wall is mildly thickened to 5 mm. No pericholecystic edema. Negative sonographic Murphy sign. Common bile duct: Diameter: 8 mm.  Where visualized, no filling defect. There is intrahepatic bile duct dilatation Liver: There are 3 solid appearing liver masses seen in the right liver measuring up to 5.8 cm. Portal vein is patent on color Doppler imaging with normal direction of blood flow towards the liver. IMPRESSION: 1. At least 3 liver masses primarily concerning for metastatic disease. Recommend enhanced abdomen and pelvis CT. 2. Cholelithiasis and sludge including stone at the gallbladder neck or cystic duct. The incompletely visualized common bile duct is also dilated and there is intrahepatic biliary dilatation. There could be obstructing stone or mass, attention on follow-up CT. 3. No suspected cholecystitis. Electronically Signed   By: Monte Fantasia M.D.   On: 02/01/2019 05:48    ____________________________________________   PROCEDURES  Critical Care performed:  No   Procedure(s) performed:   Procedures   ____________________________________________   INITIAL IMPRESSION / ASSESSMENT AND PLAN / ED COURSE  As part of my medical decision making, I reviewed the following data within the [facility] History obtained from family, Nursing notes reviewed and incorporated, Labs reviewed , Old chart reviewed, Discussed with admitting physician , Discussed with gastroenterology (Dr. Alice Reichert) and Notes from prior ED visits    Differential diagnosis includes, but is not limited to, nonadherence to medication regimen leading to partial seizures, acute infection, electrolyte abnormality, biliary disease.  Valproic acid level is subtherapeutic.  Given that she takes 3 different medications I think it is reasonable to get her doses of each in order to try to get her back to therapeutic level.  She also has a urinary tract infection which could be lowering her seizure threshold.  Most concerning, however, is her comprehensive metabolic panel which demonstrates global elevation of her LFTs, particularly the alkaline phosphatase and a total bilirubin of 3.3.  Her last labs available in the computer system including care everywhere were from September of last year (about 5 months ago, and they were normal at that time.  Although she is not having any current abdominal pain, I am concerned she may be suffering from chronic cholecystitis and I will have ordered an ultrasound of the right upper quadrant of the abdomen for further evaluation.  I am ordering Depakote DR 250 mg PO and Lamictal 150 mg PO.  Phenytoin will be administered by IV and amount will be determined once the level is back.  Clinical Course as of Feb 02 716  Thu Feb 01, 2019  9528 Phenytoin level is undetectable.  I have ordered fosphenytoin 20 mg/kg equivalent.  Ultrasound is pending right now.  I have also ordered Keflex 500 mg by mouth for the UTI.  Phenytoin Lvl(!): <2.5 [CF]  4132  Very abnormal ultrasound concerning for metastatic disease and probable obstruction from either a stone or a mass in the common bile duct.  I am proceeding with CT scan of the abdomen and pelvis as per radiology recommendations, and given that she has had some respiratory symptoms recently I am scanning the chest as well with IV contrast to look for evidence of metastatic disease, tumor, etc.  US ABDOMEN LIMITED RUQ [CF]  4401 Although CT chest shows no acute abnormalities, the CT abdomen/pelvis is concerning for cholangiocarcinoma.  Given the evidence of obstruction I believe that she will need admission, but I have paged GI to discuss whether or not she can be adequately cared for at this facility given the symptoms verbal coverage in terms of ERCP  CT ABDOMEN PELVIS W CONTRAST [CF]  [phone] Of note,  the patient is also nauseated and she is getting her phenytoin infusion.  She was also having some burning at the IV site.  I encouraged the nurse to use a small normal saline bolus to piggyback the medication and ordered another Zofran 4 mg IV for the nausea.   [CF]  [phone] The patient is still vomiting and looks more ill now that she has over the last 9 hours.  She is diaphoretic but is still alert.  I have asked the nurse to turn off the phenytoin since her acute symptoms seem to begin after the initiation of the phenytoin infusion.I spoke with Dr. Alice Reichert about the patient's probable diagnosis.  He does feel that we can admit the patient to this facility and that Dr. Allen Norris should be available for ERCP and stenting of the common bile duct if it is in fact necessary to do so.  I then spoke with Dr. Brett Albino just now by phone with the hospitalist service and discussed the case in detail.  She will admit the patient.   [CF]    Clinical Course User Index [CF] Hinda Kehr, MD    ____________________________________________  FINAL CLINICAL IMPRESSION(S) / ED DIAGNOSES  Final diagnoses:  Biliary obstruction    Seizure disorder ([place])  Cholangiocarcinoma ([place])  Peritoneal carcinomatosis ([place])  Acute respiratory failure with hypoxemia ([facility])  Urinary tract infection without hematuria, site unspecified  Nausea and vomiting, intractability of vomiting not specified, unspecified vomiting type     MEDICATIONS GIVEN DURING THIS VISIT:  Medications  0.9 %  sodium chloride infusion (has no administration in time range)  sodium chloride 0.9 % bolus 250 mL (250 mLs Intravenous New Bag/Given 02/01/19 0652)  divalproex (DEPAKOTE) DR tablet 250 mg (250 mg Oral Given 02/01/19 0403)  lamoTRIgine (LAMICTAL) tablet 150 mg (150 mg Oral Given 02/01/19 0403)  cephALEXin (KEFLEX) capsule 500 mg (500 mg Oral Given 02/01/19 0628)  phenytoin (DILANTIN) 1,500 mg in sodium chloride 0.9 % 250 mL IVPB (1,500 mg Intravenous New Bag/Given 02/01/19 0626)  iopamidol (ISOVUE-300) 61 % injection 100 mL (100 mLs Intravenous Contrast Given 02/01/19 0551)  ondansetron (ZOFRAN) injection 4 mg (4 mg Intravenous Given 02/01/19 2449)     ED Discharge Orders    None       Note:  This document was prepared using Dragon voice recognition software and may include unintentional dictation errors.   Hinda Kehr, MD 02/01/19 [phone]

## 2019-02-01 NOTE — Anesthesia Procedure Notes (Signed)
Performed by: Abigail Marsiglia Ben, CRNA       

## 2019-02-01 NOTE — Consult Note (Signed)
Cephas Darby, MD [address]  [place]  [place], [place] [address]  Main: [phone]  Fax: [phone] Pager: [phone]   Consultation  Referring Provider:     No ref. provider found Primary Care Physician:  Remi Haggard, FNP Primary Gastroenterologist: None        Reason for Consultation:     Obstructive jaundice, cholangiocarcinoma, evaluate for ERCP  Date of Admission:  02/01/2019 Date of Consultation:  02/01/2019         HPI:   Kaylee Wolf is a 68 y.o. female with history of seizure disorder, was brought by her family to ER yesterday due to focal type seizure-like activity all day yesterday, and she was not taking her seizure medications due to recent URI.  She was seen by her PCP few weeks ago as she was complaining of upper abdominal pain, she had an ultrasound and she was told that she has gallstones and need to be seen by the surgeon for cholecystectomy.  Routine labs in the ER, revealed significantly elevated LFTs with obstructive pattern, alkaline phosphatase 1171, T bili 3.3, AST 173, ALT 84.  CBC was normal.  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  Therefore, GI is consulted to evaluate for ERCP  When I interviewed the patient in the endoscopy suite, she denied any abdominal pain, nausea.  Her daughters are bedside.  Her family reported that she has been having dark urine and pale stools.  Denied fever, chills.  Patient is made n.p.o. and I contacted Dr. Allen Norris for ERCP today  She is not on any antiplatelet agents as outpatient  Past Medical History:  Diagnosis Date  . Anxiety   . Colon polyp 11/29/2012   at [place].  . Insomnia   . Mixed hyperlipidemia   . Recurrent major depressive disorder ([place])    . RLS (restless legs syndrome)   . Seizures ([place])   . Tinea pedis of both feet   . Type 2 diabetes mellitus ([place]) 2014?  [name] Varicose veins     Past Surgical History:  Procedure Laterality Date  . COLONOSCOPY  11/29/2012   Dr [name]  . none      Prior to Admission medications   Medication Sig Start Date End Date Taking? Authorizing Provider  aspirin EC 81 MG EC tablet Take 1 tablet (81 mg total) by mouth daily. 02/03/18  Yes Wieting, Richard, MD  atorvastatin (LIPITOR) 20 MG tablet Take 1 tablet (20 mg total) by mouth daily. 09/06/17  Yes Karamalegos, Devonne Doughty, DO  blood glucose meter kit and supplies KIT Dispense based on patient and insurance preference. Check blood glucose once daily. 11/25/15  Yes Krebs, Amy Lauren, NP  Cholecalciferol (VITAMIN D-3) 1000 UNITS CAPS Take 1 capsule by mouth daily.    Yes [provider]  divalproex (DEPAKOTE) 125 MG DR tablet Take 1 pill in am and 2 pill at night 02/21/17  Yes Rainey Pines, MD  escitalopram (LEXAPRO) 20 MG tablet Take 1 tablet (20 mg total) by mouth daily. 09/06/17  Yes Karamalegos, Devonne Doughty, DO  gabapentin (NEURONTIN) 400 MG capsule Take 1 capsule (400 mg total) by mouth 2 (two) times daily. 09/06/17  Yes Karamalegos, Devonne Doughty, DO  lamoTRIgine (LAMICTAL) 150 MG tablet Take 150 mg by mouth 2 (two) times  daily.  07/20/16  Yes [provider]  linaclotide (LINZESS) 72 MCG capsule Take 72 mcg by mouth as needed.   Yes [provider]  metFORMIN (GLUCOPHAGE) 500 MG tablet Take 1 tablet (500 mg total) by mouth 2 (two) times daily. 09/06/17  Yes Karamalegos, Devonne Doughty, DO  Multiple Vitamin (MULTIVITAMIN) tablet Take 1 tablet by mouth daily.   Yes [provider]  omeprazole (PRILOSEC) 20 MG capsule Take 1 capsule (20 mg total) by mouth daily. Before 30 min before first meal of day 09/06/17  Yes Karamalegos, Alexander J, DO  ondansetron (ZOFRAN-ODT) 4 MG disintegrating tablet Take 1 tablet (4 mg total) by  mouth 2 (two) times daily. 09/06/17  Yes Karamalegos, Devonne Doughty, DO  phenytoin (DILANTIN) 200 MG ER capsule Take 200 mg by mouth 2 (two) times daily.  11/17/15  Yes [provider]    Family History  Problem Relation Age of Onset  . Diabetes Mother   . Heart disease Mother   . Cancer Father   . Breast cancer Neg Hx      Social History   Tobacco Use  . Smoking status: Never Smoker  . Smokeless tobacco: Never Used  Substance Use Topics  . Alcohol use: No    Alcohol/week: 0.0 standard drinks  . Drug use: No    Allergies as of 01/31/2019  . (No Known Allergies)    Review of Systems:    All systems reviewed and negative except where noted in HPI.   Physical Exam:  Vital signs in last 24 hours: Temp:  [97.5 F (36.4 C)-99.6 F (37.6 C)] 98.1 F (36.7 C) (02/06 1603) Pulse Rate:  [64-112] 84 (02/06 1603) Resp:  [15-22] 20 (02/06 1603) BP: (113-155)/(61-72) 113/70 (02/06 1603) SpO2:  [90 %-97 %] 92 % (02/06 1603) Weight:  [86.1 kg-86.2 kg] 86.1 kg (02/06 1008) Last BM Date: 01/31/19 General:   Pleasant, cooperative in NAD Head:  Normocephalic and atraumatic. Eyes:   No icterus.   Conjunctiva pink. PERRLA. Ears:  Normal auditory acuity. Neck:  Supple; no masses or thyroidomegaly Lungs: Respirations even and unlabored. Lungs clear to auscultation bilaterally.   No wheezes, crackles, or rhonchi.  Heart:  Regular rate and rhythm;  Without murmur, clicks, rubs or gallops Abdomen:  Soft, nondistended, nontender. Normal bowel sounds. No appreciable masses or hepatomegaly.  No rebound or guarding.  Rectal:  Not performed. Msk:  Symmetrical without gross deformities.  Strength normal Extremities:  Without edema, cyanosis or clubbing. Neurologic:  Alert and oriented x3;  grossly normal neurologically. Skin:  Intact without significant lesions or rashes. Cervical Nodes:  No significant cervical adenopathy. Psych:  Alert and cooperative. Normal affect.  LAB  RESULTS: CBC Latest Ref Rng & Units 01/31/2019 09/09/2018 02/04/2018  WBC 4.0 - 10.5 K/uL 6.7 4.7 4.0  Hemoglobin 12.0 - 15.0 g/dL 12.2 13.1 12.9  Hematocrit 36.0 - 46.0 % 39.4 38.7 38.7  Platelets 150 - 400 K/uL 409(H) 188 177    BMET BMP Latest Ref Rng & Units 01/31/2019 09/09/2018 02/03/2018  Glucose 70 - 99 mg/dL 198(H) 107(H) 109(H)  BUN 8 - 23 mg/dL _0 Creatinine 0.44 - 1.00 mg/dL 0.76 0.93 0.77  Sodium 135 - 145 mmol/L 136 141 143  Potassium 3.5 - 5.1 mmol/L 4.1 3.8 3.5  Chloride 98 - 111 mmol/L 98 103 106  CO2 22 - 32 mmol/L 29 30 32  Calcium 8.9 - 10.3 mg/dL 8.4(L) 8.9 8.7(L)    LFT Hepatic Function Latest  Ref Rng & Units 01/31/2019 09/09/2018 02/01/2018  Total Protein 6.5 - 8.1 g/dL 7.2 7.0 7.2  Albumin 3.5 - 5.0 g/dL 2.8(L) 4.2 4.2  AST 15 - 41 U/L 173(H) 22 19  ALT 0 - 44 U/L 84(H) 13 15  Alk Phosphatase 38 - 126 U/L 1,171(H) 105 115  Total Bilirubin 0.3 - 1.2 mg/dL 3.3(H) 0.3 0.3     STUDIES: Ct Chest W Contrast  Result Date: 02/01/2019 CLINICAL DATA:  Cough with persistent shortness of breath. EXAM: CT CHEST, ABDOMEN, AND PELVIS WITH CONTRAST TECHNIQUE: Multidetector CT imaging of the chest, abdomen and pelvis was performed following the standard protocol during bolus administration of intravenous contrast. CONTRAST:  142m ISOVUE-300 IOPAMIDOL (ISOVUE-300) INJECTION 61% COMPARISON:  Right upper quadrant ultrasound earlier today FINDINGS: CT CHEST FINDINGS Cardiovascular: Normal heart size. No pericardial effusion. Coronary atherosclerosis. Mild aortic atherosclerosis. Mediastinum/Nodes: Negative for adenopathy. Lungs/Pleura: Mild dependent atelectasis. No consolidation or nodularity Musculoskeletal: No acute or aggressive finding. Generalized spondylosis. CT ABDOMEN PELVIS FINDINGS Hepatobiliary: 4 or 5 indistinct liver masses centered on the right, measuring up to 2.3 cm. There is cholelithiasis and gallbladder wall thickening with mild fat indistinctness. Murphy sign was  negative on prior ultrasound. There is intrahepatic biliary duct dilatation above a circumferentially thickened common bile duct. Small subcapsular fluid collection along the right liver without serosal enhancement, favor loculated peritoneal fluid. Pancreas: No evident mass or inflammation Spleen: Eggshell calcification at the hilum measuring 2.3 cm, compatible with remote insult. Adrenals/Urinary Tract: Negative adrenals. No hydronephrosis or stone. Negative bladder. Stomach/Bowel: No evident obstruction, inflammation, or mass. Vascular/Lymphatic: Prominent but not strictly enlarged lymph nodes in the deep liver drainage. Reproductive: Negative Other: Small volume ascites. Nodular appearance of the omentum including omentum that is herniated through a fatty umbilical hernia. Musculoskeletal: No acute or aggressive finding. Advanced lumbar facet degeneration. IMPRESSION: 1. 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 Please correlate for cholangitis symptoms. 2. Indistinct right liver masses, favor metastatic disease over early biliary abscesses. 3. Nodular omentum most consistent with peritoneal carcinomatosis. 4. Cholelithiasis. 5. No acute or malignant finding in the chest. Electronically Signed   By: JMonte FantasiaM.D.   On: 02/01/2019 06:34   Ct Abdomen Pelvis W Contrast  Result Date: 02/01/2019 CLINICAL DATA:  Cough with persistent shortness of breath. EXAM: CT CHEST, ABDOMEN, AND PELVIS WITH CONTRAST TECHNIQUE: Multidetector CT imaging of the chest, abdomen and pelvis was performed following the standard protocol during bolus administration of intravenous contrast. CONTRAST:  1064mISOVUE-300 IOPAMIDOL (ISOVUE-300) INJECTION 61% COMPARISON:  Right upper quadrant ultrasound earlier today FINDINGS: CT CHEST FINDINGS Cardiovascular: Normal heart size. No pericardial effusion. Coronary  atherosclerosis. Mild aortic atherosclerosis. Mediastinum/Nodes: Negative for adenopathy. Lungs/Pleura: Mild dependent atelectasis. No consolidation or nodularity Musculoskeletal: No acute or aggressive finding. Generalized spondylosis. CT ABDOMEN PELVIS FINDINGS Hepatobiliary: 4 or 5 indistinct liver masses centered on the right, measuring up to 2.3 cm. There is cholelithiasis and gallbladder wall thickening with mild fat indistinctness. Murphy sign was negative on prior ultrasound. There is intrahepatic biliary duct dilatation above a circumferentially thickened common bile duct. Small subcapsular fluid collection along the right liver without serosal enhancement, favor loculated peritoneal fluid. Pancreas: No evident mass or inflammation Spleen: Eggshell calcification at the hilum measuring 2.3 cm, compatible with remote insult. Adrenals/Urinary Tract: Negative adrenals. No hydronephrosis or stone. Negative bladder. Stomach/Bowel: No evident obstruction, inflammation, or mass. Vascular/Lymphatic: Prominent but not strictly enlarged lymph nodes in  the deep liver drainage. Reproductive: Negative Other: Small volume ascites. Nodular appearance of the omentum including omentum that is herniated through a fatty umbilical hernia. Musculoskeletal: No acute or aggressive finding. Advanced lumbar facet degeneration. IMPRESSION: 1. 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 Please correlate for cholangitis symptoms. 2. Indistinct right liver masses, favor metastatic disease over early biliary abscesses. 3. Nodular omentum most consistent with peritoneal carcinomatosis. 4. Cholelithiasis. 5. No acute or malignant finding in the chest. Electronically Signed   By: Monte Fantasia M.D.   On: 02/01/2019 06:34   Dg C-arm 1-60 Min-no Report  Result Date: 02/01/2019 Fluoroscopy was utilized by the requesting  physician.  No radiographic interpretation.   US Abdomen Limited Ruq  Result Date: 02/01/2019 CLINICAL DATA:  Episodic right upper quadrant pain EXAM: ULTRASOUND ABDOMEN LIMITED RIGHT UPPER QUADRANT COMPARISON:  None. FINDINGS: Gallbladder: Gallbladder sludge and multiple shadowing stones, including a stone at the neck. Gallbladder wall is mildly thickened to 5 mm. No pericholecystic edema. Negative sonographic Murphy sign. Common bile duct: Diameter: 8 mm. Where visualized, no filling defect. There is intrahepatic bile duct dilatation Liver: There are 3 solid appearing liver masses seen in the right liver measuring up to 5.8 cm. Portal vein is patent on color Doppler imaging with normal direction of blood flow towards the liver. IMPRESSION: 1. At least 3 liver masses primarily concerning for metastatic disease. Recommend enhanced abdomen and pelvis CT. 2. Cholelithiasis and sludge including stone at the gallbladder neck or cystic duct. The incompletely visualized common bile duct is also dilated and there is intrahepatic biliary dilatation. There could be obstructing stone or mass, attention on follow-up CT. 3. No suspected cholecystitis. Electronically Signed   By: Monte Fantasia M.D.   On: 02/01/2019 05:48      Impression / Plan:   Kaylee Wolf is a 68 y.o. female with history of seizure disorder, presented with seizure-like activity secondary to nonadherence to antiseizure medication given recent URI and incidentally found to have obstructive jaundice and CT revealed metastatic liver lesions, intrahepatic biliary dilation and thickened CBD concerning for cholangiocarcinoma.  Patient does not have ascending cholangitis  N.p.o. ERCP by Dr. Allen Norris today Consult oncology  I have discussed alternative options, risks & benefits,  which include, but are not limited to, bleeding, infection, perforation,respiratory complication & drug reaction.  The patient agrees with this plan & written consent will  be obtained.    Thank you for involving me in the care of this patient.      LOS: 0 days   Sherri Sear, MD  02/01/2019, 4:24 PM   Note: This dictation was prepared with Dragon dictation along with smaller phrase technology. Any transcriptional errors that result from this process are unintentional.

## 2019-02-02 ENCOUNTER — Telehealth: Payer: Self-pay | Admitting: *Deleted

## 2019-02-02 ENCOUNTER — Encounter: Payer: Self-pay | Admitting: Gastroenterology

## 2019-02-02 DIAGNOSIS — L8990 Pressure ulcer of unspecified site, unspecified stage: Secondary | ICD-10-CM

## 2019-02-02 LAB — HEPATITIS B SURFACE ANTIBODY,QUALITATIVE: Hep B S Ab: NONREACTIVE

## 2019-02-02 LAB — COMPREHENSIVE METABOLIC PANEL
ALT: 56 U/L — AB (ref 0–44)
AST: 91 U/L — ABNORMAL HIGH (ref 15–41)
Albumin: 2.3 g/dL — ABNORMAL LOW (ref 3.5–5.0)
Alkaline Phosphatase: 902 U/L — ABNORMAL HIGH (ref 38–126)
Anion gap: 7 (ref 5–15)
BUN: 11 mg/dL (ref 8–23)
CO2: 29 mmol/L (ref 22–32)
CREATININE: 0.54 mg/dL (ref 0.44–1.00)
Calcium: 8.1 mg/dL — ABNORMAL LOW (ref 8.9–10.3)
Chloride: 106 mmol/L (ref 98–111)
GFR calc Af Amer: >60 mL/min (ref >60)
GFR calc non Af Amer: >60 mL/min (ref >60)
Glucose, Bld: 132 mg/dL — ABNORMAL HIGH (ref 70–99)
Potassium: 3.9 mmol/L (ref 3.5–5.1)
Sodium: 142 mmol/L (ref 135–145)
Total Bilirubin: 1.6 mg/dL — ABNORMAL HIGH (ref 0.3–1.2)
Total Protein: 5.8 g/dL — ABNORMAL LOW (ref 6.5–8.1)

## 2019-02-02 LAB — GLUCOSE, CAPILLARY
Glucose-Capillary: 100 mg/dL — ABNORMAL HIGH (ref 70–99)
Glucose-Capillary: 116 mg/dL — ABNORMAL HIGH (ref 70–99)
Glucose-Capillary: 113 mg/dL — ABNORMAL HIGH (ref 70–99)
Glucose-Capillary: 112 mg/dL — ABNORMAL HIGH (ref 70–99)
Glucose-Capillary: 131 mg/dL — ABNORMAL HIGH (ref 70–99)

## 2019-02-02 LAB — HEPATITIS C ANTIBODY: HCV Ab: <0.1 s/co ratio (ref 0.0–0.9)

## 2019-02-02 LAB — CBC
HCT: 35.3 % — ABNORMAL LOW (ref 36.0–46.0)
Hemoglobin: 10.6 g/dL — ABNORMAL LOW (ref 12.0–15.0)
MCH: 26.8 pg (ref 26.0–34.0)
MCHC: 30 g/dL (ref 30.0–36.0)
MCV: 89.4 fL (ref 80.0–100.0)
Platelets: 341 10*3/uL (ref 150–400)
RBC: 3.95 MIL/uL (ref 3.87–5.11)
RDW: 16.2 % — ABNORMAL HIGH (ref 11.5–15.5)
WBC: 6.8 10*3/uL (ref 4.0–10.5)
nRBC: 0 % (ref 0.0–0.2)

## 2019-02-02 LAB — HEPATITIS B SURFACE ANTIGEN: Hepatitis B Surface Ag: NEGATIVE

## 2019-02-02 LAB — URINE CULTURE: Special Requests: NORMAL

## 2019-02-02 LAB — HIV ANTIBODY (ROUTINE TESTING W REFLEX): HIV Screen 4th Generation wRfx: NONREACTIVE

## 2019-02-02 LAB — HEPATITIS B CORE ANTIBODY, TOTAL: Hep B Core Total Ab: NEGATIVE

## 2019-02-02 MED ORDER — HEPARIN SODIUM (PORCINE) 5000 UNIT/ML IJ SOLN
5000.0000 [IU] | Freq: Three times a day (TID) | INTRAMUSCULAR | Status: DC
  Administered 2019-02-03 – 2019-02-04 (×3): 5000 [IU] via SUBCUTANEOUS
  Filled 2019-02-02 (×2): qty 1

## 2019-02-02 NOTE — Progress Notes (Signed)
PT Cancellation Note  Patient Details Name: Kaylee Wolf MRN: [id number] DOB: August 25, 1951   Cancelled Treatment:    Reason Eval/Treat Not Completed: (Consult received and chart reviewed.  Patient declined participation with PT evaluation despite encouragement. Offered sitting EOB or OOB to chair; continued to decline. Unable to redirect.  Will continue efforts next date as patient agreeable.)  Of note, patient does report living with grandson in single-level home, no steps to enter.  Reports baseline mobility without assist device; denies recent fall history. Does not drive (seizure history); grandson assists with transportation and community needs.  Patient is questionable historian; will verify social information with family as available.  Gola Bribiesca H. Owens Shark, PT, DPT, NCS 02/02/19, 2:53 PM [phone]

## 2019-02-02 NOTE — Anesthesia Postprocedure Evaluation (Signed)
Anesthesia Post Note  Patient: Kaylee Wolf  Procedure(s) Performed: ENDOSCOPIC RETROGRADE CHOLANGIOPANCREATOGRAPHY (ERCP) (N/A )  Patient location during evaluation: PACU Anesthesia Type: General Level of consciousness: awake and alert Pain management: pain level controlled Vital Signs Assessment: post-procedure vital signs reviewed and stable Respiratory status: spontaneous breathing, nonlabored ventilation, respiratory function stable and patient connected to nasal cannula oxygen Cardiovascular status: blood pressure returned to baseline and stable Postop Assessment: no apparent nausea or vomiting Anesthetic complications: no     Last Vitals:  Vitals:   02/01/19 1939 02/02/19 0531  BP: 104/60 105/60  Pulse: 73 70  Resp: 17 18  Temp: (!) 36.3 C 36.6 C  SpO2: 93% 93%    Last Pain:  Vitals:   02/02/19 0531  TempSrc: Oral  PainSc:                  Molli Barrows

## 2019-02-02 NOTE — Progress Notes (Signed)
Spoke with Dr. Hedwig Morton regarding request for liver biopsy on this patient who is having ERCP today, to be admitted after. Plan is to do liver biopsy either as inpatient, or after patient is discharged as outpatient procedure.

## 2019-02-02 NOTE — Telephone Encounter (Signed)
Dr. Janese Banks saw the patient in the hospital needs a follow-up appointment as an outpatient on next Thursday.  Verdis Frederickson tried calling the telephone for the patient as well as her daughter [name] in either phone or working numbers.  We put the appointment in the computer and when patient is discharged she will have that on her discharge information with the appointment date and time

## 2019-02-02 NOTE — Progress Notes (Signed)
Cephas Darby, MD [address]  [place]  [place], [place] [address]  Main: [phone]  Fax: [phone] Pager: [phone]   Subjective: Underwent ERCP yesterday. No acute events overnight, denies abd pain/n/v. Her daughters bedside  Objective: Vital signs in last 24 hours: Vitals:   02/01/19 1430 02/01/19 1603 02/01/19 1939 02/02/19 0531  BP: 135/70 113/70 104/60 105/60  Pulse: 95 84 73 70  Resp: _0 Temp: 98.2 F (36.8 C) 98.1 F (36.7 C) (!) 97.4 F (36.3 C) 97.8 F (36.6 C)  TempSrc:  Oral  Oral  SpO2: 91% 92% 93% 93%  Weight:      Height:       Weight change: -0.091 kg  Intake/Output Summary (Last 24 hours) at 02/02/2019 1627 Last data filed at 02/02/2019 1422 Gross per 24 hour  Intake 1455.97 ml  Output -  Net 1455.97 ml     Exam: Heart:: Regular rate and rhythm, S1S2 present or without murmur or extra heart sounds Lungs: normal and clear to auscultation Abdomen: soft, nontender, normal bowel sounds   Lab Results: CBC Latest Ref Rng & Units 02/02/2019 01/31/2019 09/09/2018  WBC 4.0 - 10.5 K/uL 6.8 6.7 4.7  Hemoglobin 12.0 - 15.0 g/dL 10.6(L) 12.2 13.1  Hematocrit 36.0 - 46.0 % 35.3(L) 39.4 38.7  Platelets 150 - 400 K/uL 341 409(H) 188   CMP Latest Ref Rng & Units 02/02/2019 01/31/2019 09/09/2018  Glucose 70 - 99 mg/dL 132(H) 198(H) 107(H)  BUN 8 - 23 mg/dL _1 Creatinine 0.44 - 1.00 mg/dL 0.54 0.76 0.93  Sodium 135 - 145 mmol/L 142 136 141  Potassium 3.5 - 5.1 mmol/L 3.9 4.1 3.8  Chloride 98 - 111 mmol/L 106 98 103  CO2 22 - 32 mmol/L _2 Calcium 8.9 - 10.3 mg/dL 8.1(L) 8.4(L) 8.9  Total Protein 6.5 - 8.1 g/dL 5.8(L) 7.2 7.0  Total Bilirubin 0.3 - 1.2 mg/dL 1.6(H) 3.3(H) 0.3  Alkaline Phos 38 - 126 U/L 902(H) 1,171(H) 105  AST 15 - 41 U/L 91(H) 173(H) 22  ALT 0 - 44 U/L 56(H) 84(H) 13   Micro Results: Recent Results (from the past 240 hour(s))  Urine Culture     Status: Abnormal   Collection Time: 01/31/19 10:22 PM    Result Value Ref Range Status   Specimen Description   Final    URINE, CLEAN CATCH Performed at [facility], [address], [place], [place] [address]    Special Requests   Final    Normal Performed at [facility], [address] [place], [place] [address]    Culture MULTIPLE SPECIES PRESENT, SUGGEST RECOLLECTION (A)  Final   Report Status 02/02/2019 FINAL  Final   Studies/Results: Ct Chest W Contrast  Result Date: 02/01/2019 CLINICAL DATA:  Cough with persistent shortness of breath. EXAM: CT CHEST, ABDOMEN, AND PELVIS WITH CONTRAST TECHNIQUE: Multidetector CT imaging of the chest, abdomen and pelvis was performed following the standard protocol during bolus administration of intravenous contrast. CONTRAST:  167m ISOVUE-300 IOPAMIDOL (ISOVUE-300) INJECTION 61% COMPARISON:  Right upper quadrant ultrasound earlier today FINDINGS: CT CHEST FINDINGS Cardiovascular: Normal heart size. No pericardial effusion. Coronary atherosclerosis. Mild aortic atherosclerosis. Mediastinum/Nodes: Negative for adenopathy. Lungs/Pleura: Mild dependent atelectasis. No consolidation or nodularity Musculoskeletal: No acute or aggressive finding. Generalized spondylosis. CT ABDOMEN PELVIS FINDINGS Hepatobiliary: 4 or 5 indistinct liver masses centered on the right, measuring up to 2.3 cm. There is cholelithiasis and gallbladder wall  thickening with mild fat indistinctness. Murphy sign was negative on prior ultrasound. There is intrahepatic biliary duct dilatation above a circumferentially thickened common bile duct. Small subcapsular fluid collection along the right liver without serosal enhancement, favor loculated peritoneal fluid. Pancreas: No evident mass or inflammation Spleen: Eggshell calcification at the hilum measuring 2.3 cm, compatible with remote insult. Adrenals/Urinary Tract: Negative adrenals. No hydronephrosis or stone. Negative bladder. Stomach/Bowel: No evident obstruction,  inflammation, or mass. Vascular/Lymphatic: Prominent but not strictly enlarged lymph nodes in the deep liver drainage. Reproductive: Negative Other: Small volume ascites. Nodular appearance of the omentum including omentum that is herniated through a fatty umbilical hernia. Musculoskeletal: No acute or aggressive finding. Advanced lumbar facet degeneration. IMPRESSION: 1. 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 Please correlate for cholangitis symptoms. 2. Indistinct right liver masses, favor metastatic disease over early biliary abscesses. 3. Nodular omentum most consistent with peritoneal carcinomatosis. 4. Cholelithiasis. 5. No acute or malignant finding in the chest. Electronically Signed   By: Monte Fantasia M.D.   On: 02/01/2019 06:34   Ct Abdomen Pelvis W Contrast  Result Date: 02/01/2019 CLINICAL DATA:  Cough with persistent shortness of breath. EXAM: CT CHEST, ABDOMEN, AND PELVIS WITH CONTRAST TECHNIQUE: Multidetector CT imaging of the chest, abdomen and pelvis was performed following the standard protocol during bolus administration of intravenous contrast. CONTRAST:  17m ISOVUE-300 IOPAMIDOL (ISOVUE-300) INJECTION 61% COMPARISON:  Right upper quadrant ultrasound earlier today FINDINGS: CT CHEST FINDINGS Cardiovascular: Normal heart size. No pericardial effusion. Coronary atherosclerosis. Mild aortic atherosclerosis. Mediastinum/Nodes: Negative for adenopathy. Lungs/Pleura: Mild dependent atelectasis. No consolidation or nodularity Musculoskeletal: No acute or aggressive finding. Generalized spondylosis. CT ABDOMEN PELVIS FINDINGS Hepatobiliary: 4 or 5 indistinct liver masses centered on the right, measuring up to 2.3 cm. There is cholelithiasis and gallbladder wall thickening with mild fat indistinctness. Murphy sign was negative on prior ultrasound. There is intrahepatic biliary duct  dilatation above a circumferentially thickened common bile duct. Small subcapsular fluid collection along the right liver without serosal enhancement, favor loculated peritoneal fluid. Pancreas: No evident mass or inflammation Spleen: Eggshell calcification at the hilum measuring 2.3 cm, compatible with remote insult. Adrenals/Urinary Tract: Negative adrenals. No hydronephrosis or stone. Negative bladder. Stomach/Bowel: No evident obstruction, inflammation, or mass. Vascular/Lymphatic: Prominent but not strictly enlarged lymph nodes in the deep liver drainage. Reproductive: Negative Other: Small volume ascites. Nodular appearance of the omentum including omentum that is herniated through a fatty umbilical hernia. Musculoskeletal: No acute or aggressive finding. Advanced lumbar facet degeneration. IMPRESSION: 1. 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 Please correlate for cholangitis symptoms. 2. Indistinct right liver masses, favor metastatic disease over early biliary abscesses. 3. Nodular omentum most consistent with peritoneal carcinomatosis. 4. Cholelithiasis. 5. No acute or malignant finding in the chest. Electronically Signed   By: JMonte FantasiaM.D.   On: 02/01/2019 06:34   Dg C-arm 1-60 Min-no Report  Result Date: 02/01/2019 Fluoroscopy was utilized by the requesting physician.  No radiographic interpretation.   [place]Abdomen Limited Ruq  Result Date: 02/01/2019 CLINICAL DATA:  Episodic right upper quadrant pain EXAM: ULTRASOUND ABDOMEN LIMITED RIGHT UPPER QUADRANT COMPARISON:  None. FINDINGS: Gallbladder: Gallbladder sludge and multiple shadowing stones, including a stone at the neck. Gallbladder wall is mildly thickened to 5 mm. No pericholecystic edema. Negative sonographic Murphy sign. Common bile duct: Diameter: 8 mm. Where visualized, no  filling defect. There is intrahepatic bile  duct dilatation Liver: There are 3 solid appearing liver masses seen in the right liver measuring up to 5.8 cm. Portal vein is patent on color Doppler imaging with normal direction of blood flow towards the liver. IMPRESSION: 1. At least 3 liver masses primarily concerning for metastatic disease. Recommend enhanced abdomen and pelvis CT. 2. Cholelithiasis and sludge including stone at the gallbladder neck or cystic duct. The incompletely visualized common bile duct is also dilated and there is intrahepatic biliary dilatation. There could be obstructing stone or mass, attention on follow-up CT. 3. No suspected cholecystitis. Electronically Signed   By: Monte Fantasia M.D.   On: 02/01/2019 05:48   Medications:  I have reviewed the patient's current medications. Prior to Admission:  Medications Prior to Admission  Medication Sig Dispense Refill Last Dose  . aspirin EC 81 MG EC tablet Take 1 tablet (81 mg total) by mouth daily. 30 tablet 0 01/31/2019 at 0830  . atorvastatin (LIPITOR) 20 MG tablet Take 1 tablet (20 mg total) by mouth daily. 90 tablet 3 01/30/2019 at 2000  . blood glucose meter kit and supplies KIT Dispense based on patient and insurance preference. Check blood glucose once daily. 1 each 0 Taking  . Cholecalciferol (VITAMIN D-3) 1000 UNITS CAPS Take 1 capsule by mouth daily.    01/31/2019 at 0830  . divalproex (DEPAKOTE) 125 MG DR tablet Take 1 pill in am and 2 pill at night 90 tablet 1 01/31/2019 at 0830  . escitalopram (LEXAPRO) 20 MG tablet Take 1 tablet (20 mg total) by mouth daily. 30 tablet 11 01/31/2019 at 0830  . gabapentin (NEURONTIN) 400 MG capsule Take 1 capsule (400 mg total) by mouth 2 (two) times daily. 180 capsule 3 01/31/2019 at 0830  . lamoTRIgine (LAMICTAL) 150 MG tablet Take 150 mg by mouth 2 (two) times daily.    01/31/2019 at 0830  . linaclotide (LINZESS) 72 MCG capsule Take 72 mcg by mouth as needed.   prn at prn  . metFORMIN (GLUCOPHAGE) 500 MG tablet Take 1 tablet (500 mg total)  by mouth 2 (two) times daily. 180 tablet 3 01/31/2019 at 0830  . Multiple Vitamin (MULTIVITAMIN) tablet Take 1 tablet by mouth daily.   01/31/2019 at 0830  . omeprazole (PRILOSEC) 20 MG capsule Take 1 capsule (20 mg total) by mouth daily. Before 30 min before first meal of day 30 capsule 5 01/31/2019 at 0830  . ondansetron (ZOFRAN-ODT) 4 MG disintegrating tablet Take 1 tablet (4 mg total) by mouth 2 (two) times daily. 20 tablet 1 prn at prn  . phenytoin (DILANTIN) 200 MG ER capsule Take 200 mg by mouth 2 (two) times daily.    01/31/2019 at 0830   Scheduled: . aspirin EC  81 mg Oral Daily  . atorvastatin  20 mg Oral Daily  . divalproex  250 mg Oral QHS  . divalproex  125 mg Oral q morning - 10a  . escitalopram  20 mg Oral Daily  . gabapentin  400 mg Oral BID  . [START ON 02/03/2019] heparin injection (subcutaneous)  5,000 Units Subcutaneous Q8H  . insulin aspart  0-5 Units Subcutaneous QHS  . insulin aspart  0-9 Units Subcutaneous TID WC  . lamoTRIgine  150 mg Oral BID  . multivitamin with minerals  1 tablet Oral Daily  . pantoprazole  40 mg Oral Daily  . phenytoin  200 mg Oral BID   Continuous: . sodium chloride    . sodium  chloride 125 mL/hr at 02/02/19 0736   LID:CVUDTH chloride, acetaminophen **OR** acetaminophen, morphine injection, ondansetron **OR** ondansetron (ZOFRAN) IV, oxyCODONE, polyethylene glycol Anti-infectives (From admission, onward)   Start     Dose/Rate Route Frequency Ordered Stop   02/01/19 0530  cephALEXin (KEFLEX) capsule 500 mg     500 mg Oral  Once 02/01/19 0518 02/01/19 0628     Scheduled Meds: . aspirin EC  81 mg Oral Daily  . atorvastatin  20 mg Oral Daily  . divalproex  250 mg Oral QHS  . divalproex  125 mg Oral q morning - 10a  . escitalopram  20 mg Oral Daily  . gabapentin  400 mg Oral BID  . [START ON 02/03/2019] heparin injection (subcutaneous)  5,000 Units Subcutaneous Q8H  . insulin aspart  0-5 Units Subcutaneous QHS  . insulin aspart  0-9 Units  Subcutaneous TID WC  . lamoTRIgine  150 mg Oral BID  . multivitamin with minerals  1 tablet Oral Daily  . pantoprazole  40 mg Oral Daily  . phenytoin  200 mg Oral BID   Continuous Infusions: . sodium chloride    . sodium chloride 125 mL/hr at 02/02/19 0736   PRN Meds:.sodium chloride, acetaminophen **OR** acetaminophen, morphine injection, ondansetron **OR** ondansetron (ZOFRAN) IV, oxyCODONE, polyethylene glycol   Assessment: Active Problems:   Cholangiocarcinoma (HCC)   Pressure injury of skin  Kaylee Wolf is a 68 y.o. female with history of seizure disorder, presented with seizure-like activity secondary to nonadherence to antiseizure medication given recent URI and incidentally found to have obstructive jaundice and CT revealed metastatic liver lesions, intrahepatic biliary dilation and thickened CBD concerning for cholangiocarcinoma. S/p ERCP yesterday with CBD stent placement. LFTs are improving, no post ERCP pancreatitis  Plan: Liver biopsy Monday by radiology Oncology is following  GI will sign off at this time. Follow up with Dr Allen Norris for stent exchange/ removal in 2-63month   LOS: 1 day     02/02/2019, 4:27 PM

## 2019-02-02 NOTE — Progress Notes (Signed)
[place] [place] NAME: Kaylee Wolf    MR#:  [id number]  DATE OF BIRTH:  [birth year]  SUBJECTIVE:   Patient continues to have some right upper quadrant abdominal pain, nausea, and vomiting today.  She states she has not really eaten much.  She does not have much of an appetite.  REVIEW OF SYSTEMS:  Review of Systems  Constitutional: Negative for chills and fever.  HENT: Negative for congestion and sore throat.   Eyes: Negative for blurred vision and double vision.  Respiratory: Negative for cough and shortness of breath.   Cardiovascular: Negative for chest pain and palpitations.  Gastrointestinal: Positive for abdominal pain, nausea and vomiting.  Genitourinary: Negative for dysuria and urgency.  Musculoskeletal: Negative for back pain and neck pain.  Neurological: Negative for dizziness and headaches.  Psychiatric/Behavioral: Negative for depression. The patient is not nervous/anxious.     DRUG ALLERGIES:  No Known Allergies VITALS:  Blood pressure 105/60, pulse 70, temperature 97.8 F (36.6 C), temperature source Oral, resp. rate 18, height 5\' 4"  (1.626 m), weight 86.1 kg, last menstrual period 05/10/2012, SpO2 93 %. PHYSICAL EXAMINATION:  Physical Exam  GENERAL:  67 y.o.-year-old patient lying in the bed with no acute distress.   EYES: Pupils equal, round, reactive to light and accommodation. No scleral icterus. Extraocular muscles intact.  HEENT: Head atraumatic, normocephalic. Oropharynx and nasopharynx clear.  NECK:  Supple, no jugular venous distention. No thyroid enlargement, no tenderness.  LUNGS: Normal breath sounds bilaterally, no wheezing, rales,rhonchi or crepitation. No use of accessory muscles of respiration. [place] in place. CARDIOVASCULAR: RRR, S1, S2 normal. No murmurs, rubs, or gallops.  ABDOMEN: Soft, nondistended. Bowel sounds present. No organomegaly or mass. +mildly RUQ and epigastric tenderness to  palpation. EXTREMITIES: No pedal edema, cyanosis, or clubbing.  NEUROLOGIC: Cranial nerves II through XII are intact. +global weakness. Sensation intact. Gait not checked.  PSYCHIATRIC: The patient is alert and oriented x 3.  SKIN: No obvious rash, lesion, or ulcer.  LABORATORY PANEL:  Female CBC Recent Labs  Lab 02/02/19 0607  WBC 6.8  HGB 10.6*  HCT 35.3*  PLT 341   ------------------------------------------------------------------------------------------------------------------ Chemistries  Recent Labs  Lab 02/02/19 0607  NA 142  K 3.9  CL 106  CO2 29  GLUCOSE 132*  BUN 11  CREATININE 0.54  CALCIUM 8.1*  AST 91*  ALT 56*  ALKPHOS 902*  BILITOT 1.6*   RADIOLOGY:  No results found. 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 Unable to do this until Monday, so can be performed as an outpatient if patient is discharged before then -GI has signed off- needs to f/u with Dr. Allen Norris in clinic for stent removal/exchange -IV antiemetics -Pain control -IVFs -Clear liquid diet and will advance as tolerated  Abnormal UA- UA with moderate leukocytes, but rare bacteria. Patient is asymptomatic, afebrile, and does not have a leukocytosis. -Hold on antibiotics for now -Urine culture with multiple species  Epilepsy- has not been taking meds for the last 3 weeks due to not feeling well. Concern for some seizure-like activity at home. Patient has not had any seizure-like activity here. -Continue home depakote, gabapentin, lamictal, and phenytoin  Type 2 diabetes- on metformin at home -SSI  Hyperlipidemia- stable -Continue home lipitor  Depression- stable -Continue home lexapro  All the records are reviewed and  case discussed with Care Management/Social Worker. Management plans discussed with the  patient, family and they are in agreement.  CODE STATUS: Full Code  TOTAL TIME TAKING CARE OF THIS PATIENT: 35 minutes.   More than 50% of the time was spent in counseling/coordination of care: YES  POSSIBLE D/C IN 1-2 DAYS, DEPENDING ON CLINICAL CONDITION.   Berna Spare Curtez Brallier M.D on 02/02/2019 at 4:28 PM  Between 7am to 6pm - Pager [phone]  After 6pm go to [email or web address] - Proofreader  [facility]  Office  [phone]  CC: Primary care physician; Remi Haggard, FNP  Note: This dictation was prepared with Dragon dictation along with smaller phrase technology. Any transcriptional errors that result from this process are unintentional.

## 2019-02-03 LAB — COMPREHENSIVE METABOLIC PANEL
ALT: 43 U/L (ref 0–44)
ANION GAP: 8 (ref 5–15)
AST: 64 U/L — ABNORMAL HIGH (ref 15–41)
Albumin: 2.3 g/dL — ABNORMAL LOW (ref 3.5–5.0)
Alkaline Phosphatase: 781 U/L — ABNORMAL HIGH (ref 38–126)
BUN: 9 mg/dL (ref 8–23)
CO2: 27 mmol/L (ref 22–32)
Calcium: 8.1 mg/dL — ABNORMAL LOW (ref 8.9–10.3)
Chloride: 104 mmol/L (ref 98–111)
Creatinine, Ser: 0.59 mg/dL (ref 0.44–1.00)
GFR calc Af Amer: >60 mL/min (ref >60)
GFR calc non Af Amer: >60 mL/min (ref >60)
Glucose, Bld: 102 mg/dL — ABNORMAL HIGH (ref 70–99)
Potassium: 3.9 mmol/L (ref 3.5–5.1)
SODIUM: 139 mmol/L (ref 135–145)
Total Bilirubin: 1.6 mg/dL — ABNORMAL HIGH (ref 0.3–1.2)
Total Protein: 6 g/dL — ABNORMAL LOW (ref 6.5–8.1)

## 2019-02-03 LAB — CBC
HCT: 38.7 % (ref 36.0–46.0)
Hemoglobin: 11.6 g/dL — ABNORMAL LOW (ref 12.0–15.0)
MCH: 26.7 pg (ref 26.0–34.0)
MCHC: 30 g/dL (ref 30.0–36.0)
MCV: 89.2 fL (ref 80.0–100.0)
Platelets: 362 10*3/uL (ref 150–400)
RBC: 4.34 MIL/uL (ref 3.87–5.11)
RDW: 16.4 % — ABNORMAL HIGH (ref 11.5–15.5)
WBC: 7 10*3/uL (ref 4.0–10.5)
nRBC: 0 % (ref 0.0–0.2)

## 2019-02-03 LAB — CANCER ANTIGEN 19-9: CAN 19-9: 242 U/mL — AB (ref 0–35)

## 2019-02-03 LAB — GLUCOSE, POCT (MANUAL RESULT ENTRY): POC Glucose: 168 mg/dl — AB (ref 70–99)

## 2019-02-03 LAB — GLUCOSE, CAPILLARY
Glucose-Capillary: 85 mg/dL (ref 70–99)
Glucose-Capillary: 116 mg/dL — ABNORMAL HIGH (ref 70–99)
Glucose-Capillary: 117 mg/dL — ABNORMAL HIGH (ref 70–99)

## 2019-02-03 NOTE — Progress Notes (Signed)
Reports feeling better but has generalized weakness. Denies pain. Reports has pale stool not observed by staff- pt educated to let staff observe her next stools. LFT's improving. Tolerating clear liquids but has limited appetite. Family in. Up in chair and tolerated well; 1+ to return to bed.

## 2019-02-03 NOTE — Evaluation (Signed)
Physical Therapy Evaluation Patient Details Name: Kaylee Wolf MRN: [id number] DOB: 11-10-1951 Today's Date: 02/03/2019   History of Present Illness  Pt is a 68 year old female admitted for cholangiocarcinoma and following report of seizure like activity at home.  Endoscopic retrograde cholangiopacreatography performed 02/02/2019 and pt also has a liver biopsy pending.  Significant medical hx includes seizure disorder, anxiety and major recurrent depression.  Clinical Impression  Pt is a 68 year old female who lives with her grandson and is a limited community ambulator without AD at baseline.  She reported feeling "better" this morning and requested to sit up in chair to finish her breakfast.  She presented with some impulsivity, requiring redirection, but was alert and oriented to self and situation.  Pt willing to participate but hesitant, stating that she felt "shaky".  Pt able to perform most bed mobility without help from PT and sit at EOB without assistance.  Pt presented with fair strength of LE's and poor-fair strength of UE's.  She was able to stand from bedside but with a tendency to lean against the bed for support.  Pt able to manage a squat pivot transfer to chair without PT assistance but reported feeling to "weak" to attempt walking today.  Nurse tech in   Pt will benefit from skilled PT to focus on tolerance to activity, strength and balance.  She will need HH PT and supervision for safety with mobility following discharge.    Follow Up Recommendations Home health PT;Supervision - Intermittent;Supervision for mobility/OOB    Equipment Recommendations  Rolling walker with 5" wheels    Recommendations for Other Services       Precautions / Restrictions Precautions Precautions: Fall Restrictions Weight Bearing Restrictions: No      Mobility  Bed Mobility Overal bed mobility: Needs Assistance Bed Mobility: Supine to Sit     Supine to sit: Min assist     General bed  mobility comments: handheld assist to sit up but able to get to EOB independently otherwise  Transfers Overall transfer level: Needs assistance Equipment used: 1 person hand held assist Transfers: Sit to/from [employer] Sit to Stand: Min guard   Squat pivot transfers: Supervision     General transfer comment: Pt able to stand without assistance to rise; stood slowly and with visible tremors adn reported feeling "shaky".  Able to place hand on arm of chair and perform transfer without PT assistance.  Ambulation/Gait Ambulation/Gait assistance: (Deferred due to pt unsteadiness.)              Stairs            Wheelchair Mobility    Modified Rankin (Stroke Patients Only)       Balance Overall balance assessment: Needs assistance Sitting-balance support: Bilateral upper extremity supported;Feet supported Sitting balance-Leahy Scale: Fair     Standing balance support: Bilateral upper extremity supported Standing balance-Leahy Scale: Poor                               Pertinent Vitals/Pain Pain Assessment: No/denies pain    Home Living Family/patient expects to be discharged to:: Private residence Living Arrangements: Other relatives(Grandson) Available Help at Discharge: Family;Available PRN/intermittently Type of Home: House Home Access: Stairs to enter Entrance Stairs-Rails: None Entrance Stairs-Number of Steps: 5 Home Layout: One level Home Equipment: None      Prior Function Level of Independence: Independent  Comments: Pt reports mostly household ambulation recently but that she can normally "get out and walk" when she feels up to it.     Hand Dominance        Extremity/Trunk Assessment   Upper Extremity Assessment Upper Extremity Assessment: Generalized weakness    Lower Extremity Assessment Lower Extremity Assessment: Overall WFL for tasks assessed(Grossly 4-/5 bilaterally.)    Cervical / Trunk  Assessment Cervical / Trunk Assessment: Kyphotic(Slightly kyphotic)  Communication   Communication: HOH  Cognition Arousal/Alertness: Awake/alert Behavior During Therapy: WFL for tasks assessed/performed Overall Cognitive Status: No family/caregiver present to determine baseline cognitive functioning                                 General Comments: Pt requires some redirection but follows directions 90% of the time.      General Comments      Exercises     Assessment/Plan    PT Assessment Patient needs continued PT services  PT Problem List Decreased strength;Decreased mobility;Decreased balance;Decreased knowledge of use of DME;Decreased activity tolerance       PT Treatment Interventions DME instruction;Functional mobility training;Balance training;Patient/family education;Gait training;Therapeutic activities;Stair training;Therapeutic exercise    PT Goals (Current goals can be found in the Care Plan section)  Acute Rehab PT Goals Patient Stated Goal: To return to walking household and limited community distances without an AD. PT Goal Formulation: With patient Time For Goal Achievement: 02/17/19 Potential to Achieve Goals: Good    Frequency Min 2X/week   Barriers to discharge        Co-evaluation               AM-PAC PT "6 Clicks" Mobility  Outcome Measure Help needed turning from your back to your side while in a flat bed without using bedrails?: None Help needed moving from lying on your back to sitting on the side of a flat bed without using bedrails?: A Little Help needed moving to and from a bed to a chair (including a wheelchair)?: A Little Help needed standing up from a chair using your arms (e.g., wheelchair or bedside chair)?: A Little Help needed to walk in hospital room?: A Lot Help needed climbing 3-5 steps with a railing? : A Lot 6 Click Score: 17    End of Session Equipment Utilized During Treatment: Gait belt Activity  Tolerance: Patient limited by fatigue Patient left: in chair;with call bell/phone within reach;with chair alarm set Nurse Communication: Mobility status PT Visit Diagnosis: Unsteadiness on feet (R26.81);Difficulty in walking, not elsewhere classified (R26.2);Muscle weakness (generalized) (M62.81)    Time: 9470-9628 PT Time Calculation (min) (ACUTE ONLY): 19 min   Charges:   PT Evaluation $PT Eval Low Complexity: 1 Low          Roxanne Gates, PT, DPT   Roxanne Gates 02/03/2019, 9:49 AM

## 2019-02-03 NOTE — Progress Notes (Signed)
[place] at [place] NAME: Kaylee Wolf    MR#:  [id number]  DATE OF BIRTH:  Mar 31, 1951  SUBJECTIVE:   Patient is tolerating clear liquid diet though she is nauseous and wants to try full liquids today.  Liver biopsy scheduled for Monday.  Out of bed to chair at her baseline she is bedbound as per patient's report  REVIEW OF SYSTEMS:  Review of Systems  Constitutional: Negative for chills and fever.  HENT: Negative for congestion and sore throat.   Eyes: Negative for blurred vision and double vision.  Respiratory: Negative for cough and shortness of breath.   Cardiovascular: Negative for chest pain and palpitations.  Gastrointestinal: Positive for abdominal pain, nausea and vomiting.  Genitourinary: Negative for dysuria and urgency.  Musculoskeletal: Negative for back pain and neck pain.  Neurological: Negative for dizziness and headaches.  Psychiatric/Behavioral: Negative for depression. The patient is not nervous/anxious.     DRUG ALLERGIES:  No Known Allergies VITALS:  Blood pressure 91/61, pulse 87, temperature 98.1 F (36.7 C), resp. rate 20, height 5\' 4"  (1.626 m), weight 86.1 kg, last menstrual period 05/10/2012, SpO2 95 %. PHYSICAL EXAMINATION:  Physical Exam  GENERAL:  68 y.o.-year-old patient lying in the bed with no acute distress.   EYES: Pupils equal, round, reactive to light and accommodation. No scleral icterus. Extraocular muscles intact.  HEENT: Head atraumatic, normocephalic. Oropharynx and nasopharynx clear.  NECK:  Supple, no jugular venous distention. No thyroid enlargement, no tenderness.  LUNGS: Normal breath sounds bilaterally, no wheezing, rales,rhonchi or crepitation. No use of accessory muscles of respiration. [place] in place. CARDIOVASCULAR: RRR, S1, S2 normal. No murmurs, rubs, or gallops.  ABDOMEN: Soft, nondistended. Bowel sounds present. No organomegaly or mass. +mildly RUQ and epigastric tenderness to  palpation. EXTREMITIES: No pedal edema, cyanosis, or clubbing.  NEUROLOGIC: Awake, alert and oriented x3 gait not checked.  PSYCHIATRIC: The patient is alert and oriented x 3.  SKIN: No obvious rash, lesion, or ulcer.  LABORATORY PANEL:  Female CBC Recent Labs  Lab 02/03/19 0421  WBC 7.0  HGB 11.6*  HCT 38.7  PLT 362   ------------------------------------------------------------------------------------------------------------------ Chemistries  Recent Labs  Lab 02/03/19 0421  NA 139  K 3.9  CL 104  CO2 27  GLUCOSE 102*  BUN 9  CREATININE 0.59  CALCIUM 8.1*  AST 64*  ALT 43  ALKPHOS 781*  BILITOT 1.6*   RADIOLOGY:  No results found. 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 Unable to do this until Monday, so can be performed as an outpatient if patient is discharged before then -GI has signed off- needs to f/u with Dr. Allen Norris in clinic for stent removal/exchange -IV antiemetics -Pain control prn -IVFs -full liquid diet and will advance as tolerated  Abnormal UA- UA with moderate leukocytes, but rare bacteria. Patient is asymptomatic, afebrile, and does not have a leukocytosis. -Not considering antibiotics as patient's urine culture has revealed multiple species which is a contaminated specimen  Epilepsy- has not been taking meds for the last 3 weeks due to not feeling well. Concern for some seizure-like activity at home. Patient has not had any seizure-like activity here. -Continue home depakote, gabapentin, lamictal, and phenytoin  Type 2 diabetes- on metformin at home -SSI  Hyperlipidemia- stable -Continue home lipitor  Depression- stable -Continue home lexapro  All  the records are reviewed and case discussed with Care Management/Social Worker. Management plans discussed with the  patient she is in agreement.  CODE STATUS: Full Code  TOTAL TIME TAKING CARE OF THIS PATIENT: 35 minutes.   More than 50% of the time was spent in counseling/coordination of care: YES  POSSIBLE D/C IN 1-2 DAYS, DEPENDING ON CLINICAL CONDITION.   Nicholes Mango M.D on 02/03/2019 at 3:40 PM  Between 7am to 6pm - Pager - [phone]  After 6pm go to [email or web address] - Proofreader  [facility]  Office  [phone]  CC: Primary care physician; Remi Haggard, FNP  Note: This dictation was prepared with Dragon dictation along with smaller phrase technology. Any transcriptional errors that result from this process are unintentional.

## 2019-02-04 LAB — GLUCOSE, CAPILLARY
Glucose-Capillary: 105 mg/dL — ABNORMAL HIGH (ref 70–99)
Glucose-Capillary: 134 mg/dL — ABNORMAL HIGH (ref 70–99)
Glucose-Capillary: 160 mg/dL — ABNORMAL HIGH (ref 70–99)

## 2019-02-04 MED ORDER — HEPARIN SODIUM (PORCINE) 5000 UNIT/ML IJ SOLN
5000.0000 [IU] | Freq: Once | INTRAMUSCULAR | Status: AC
  Administered 2019-02-04: 15:00:00 5000 [IU] via SUBCUTANEOUS
  Filled 2019-02-04: qty 1

## 2019-02-04 MED ORDER — HEPARIN SODIUM (PORCINE) 5000 UNIT/ML IJ SOLN
5000.0000 [IU] | Freq: Three times a day (TID) | INTRAMUSCULAR | Status: DC
  Administered 2019-02-06: 5000 [IU] via SUBCUTANEOUS
  Filled 2019-02-04 (×2): qty 1

## 2019-02-04 NOTE — Care Management Note (Addendum)
Case Management Note  Patient Details  Name: Kaylee Wolf MRN: [id number] Date of Birth: 18-Dec-1951  Subjective/Objective:  RNCM consulted for possible home health needs.PT recommending home health. Patient at this point agreeable but prefers I confirm with her daughter. CMS Medicare.gov Compare Post Acute Care list reviewed with patient and she is familiar with [facility] and has used them recently. Heads up referral to [facility] [place] care. Patient uses a walker in the home but has no other DME needs. RNCM team will continue to follow for disposition.                  Action/Plan:   Expected Discharge Date:                  Expected Discharge Plan:  [place]  In-House Referral:     Discharge planning Services  CM Consult  Post Acute Care Choice:  Home Health Choice offered to:  Patient  DME Arranged:    DME Agency:     HH Arranged:    [place] Agency:  [facility]  Status of Service:  In process, will continue to follow  If discussed at Long Length of Stay Meetings, dates discussed:    Additional Comments:  Latanya Maudlin, RN 02/04/2019, 9:59 AM

## 2019-02-04 NOTE — Plan of Care (Signed)

## 2019-02-04 NOTE — Progress Notes (Signed)
[place] at [place] NAME: Bambie Pizzolato    MR#:  [id number]  DATE OF BIRTH:  Feb 26, 1951  SUBJECTIVE:   Patient is tolerating full liquid diet, denies any nausea vomiting liver biopsy scheduled for Monday.  Out of bed to chair at her baseline she is bedbound as per patient's report, refusing to go to rehab center, agreeable with home health PT  REVIEW OF SYSTEMS:  Review of Systems  Constitutional: Negative for chills and fever.  HENT: Negative for congestion and sore throat.   Eyes: Negative for blurred vision and double vision.  Respiratory: Negative for cough and shortness of breath.   Cardiovascular: Negative for chest pain and palpitations.  Gastrointestinal: Positive for abdominal pain. Negative for nausea and vomiting.  Genitourinary: Negative for dysuria and urgency.  Musculoskeletal: Negative for back pain and neck pain.  Neurological: Negative for dizziness and headaches.  Psychiatric/Behavioral: Negative for depression. The patient is not nervous/anxious.     DRUG ALLERGIES:  No Known Allergies VITALS:  Blood pressure 127/63, pulse 90, temperature 98.4 F (36.9 C), temperature source Oral, resp. rate 18, height 5\' 4"  (1.626 m), weight 86.1 kg, last menstrual period 05/10/2012, SpO2 94 %. PHYSICAL EXAMINATION:  Physical Exam  GENERAL:  68 y.o.-year-old patient lying in the bed with no acute distress.   EYES: Pupils equal, round, reactive to light and accommodation. No scleral icterus. Extraocular muscles intact.  HEENT: Head atraumatic, normocephalic. Oropharynx and nasopharynx clear.  NECK:  Supple, no jugular venous distention. No thyroid enlargement, no tenderness.  LUNGS: Normal breath sounds bilaterally, no wheezing, rales,rhonchi or crepitation. No use of accessory muscles of respiration. [place] in place. CARDIOVASCULAR: RRR, S1, S2 normal. No murmurs, rubs, or gallops.  ABDOMEN: Soft, nondistended. Bowel sounds present.  No organomegaly or mass. +mildly RUQ and epigastric tenderness to palpation. EXTREMITIES: No pedal edema, cyanosis, or clubbing.  NEUROLOGIC: Awake, alert and oriented x3 gait not checked.  PSYCHIATRIC: The patient is alert and oriented x 3.  SKIN: No obvious rash, lesion, or ulcer.  LABORATORY PANEL:  Female CBC Recent Labs  Lab 02/03/19 0421  WBC 7.0  HGB 11.6*  HCT 38.7  PLT 362   ------------------------------------------------------------------------------------------------------------------ Chemistries  Recent Labs  Lab 02/03/19 0421  NA 139  K 3.9  CL 104  CO2 27  GLUCOSE 102*  BUN 9  CREATININE 0.59  CALCIUM 8.1*  AST 64*  ALT 43  ALKPHOS 781*  BILITOT 1.6*   RADIOLOGY:  No results found. 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 Unable to do this until Monday, so can be performed as an outpatient if patient is discharged before then.  N.p.o. after midnight, hold heparin subcu DVT prophylaxis evening dose -GI has signed off- needs to f/u with Dr. Allen Norris in clinic for stent removal/exchange -IV antiemetics -Pain control prn -IVFs -Soft diet and will advance as tolerated  Abnormal UA- UA with moderate leukocytes, but rare bacteria. Patient is asymptomatic, afebrile, and does not have a leukocytosis. -Not considering antibiotics as patient's urine culture has revealed multiple species which is a contaminated specimen  Epilepsy- has not been taking meds for the last 3 weeks due to not feeling well. Concern for some seizure-like activity at home. Patient has not had any seizure-like activity here. -Continue home depakote, gabapentin, lamictal, and phenytoin  Type 2 diabetes- on  metformin at home -SSI  Hyperlipidemia- stable -Continue home lipitor  Depression- stable -Continue home  lexapro  All the records are reviewed and case discussed with Care Management/Social Worker. Management plans discussed with the patient she is in agreement.  CODE STATUS: Full Code  TOTAL TIME TAKING CARE OF THIS PATIENT: 28 minutes.   More than 50% of the time was spent in counseling/coordination of care: YES  POSSIBLE D/C IN 1-2 DAYS, DEPENDING ON CLINICAL CONDITION.   Nicholes Mango M.D on 02/04/2019 at 1:33 PM  Between 7am to 6pm - Pager - [phone]  After 6pm go to [email or web address] - Proofreader  [facility]  Office  [phone]  CC: Primary care physician; Remi Haggard, FNP  Note: This dictation was prepared with Dragon dictation along with smaller phrase technology. Any transcriptional errors that result from this process are unintentional.

## 2019-02-04 NOTE — Progress Notes (Signed)
Pharmacist Communication  The patient's subcutaneous heparin has been held for tonight 2/9 and in the morning 2/10 in preparation for liver biopsy tomorrow morning.  Tawnya Crook, PharmD Pharmacy Resident  02/04/2019 1:48 PM

## 2019-02-05 ENCOUNTER — Inpatient Hospital Stay: Payer: Medicare HMO

## 2019-02-05 LAB — COMPREHENSIVE METABOLIC PANEL
ALBUMIN: 2.2 g/dL — AB (ref 3.5–5.0)
ALT: 24 U/L (ref 0–44)
AST: 35 U/L (ref 15–41)
Alkaline Phosphatase: 473 U/L — ABNORMAL HIGH (ref 38–126)
Anion gap: 4 — ABNORMAL LOW (ref 5–15)
BUN: 5 mg/dL — ABNORMAL LOW (ref 8–23)
CO2: 29 mmol/L (ref 22–32)
Calcium: 7.7 mg/dL — ABNORMAL LOW (ref 8.9–10.3)
Chloride: 108 mmol/L (ref 98–111)
Creatinine, Ser: 0.49 mg/dL (ref 0.44–1.00)
GFR calc Af Amer: >60 mL/min (ref >60)
GFR calc non Af Amer: >60 mL/min (ref >60)
Glucose, Bld: 105 mg/dL — ABNORMAL HIGH (ref 70–99)
Potassium: 3.1 mmol/L — ABNORMAL LOW (ref 3.5–5.1)
SODIUM: 141 mmol/L (ref 135–145)
Total Bilirubin: 0.8 mg/dL (ref 0.3–1.2)
Total Protein: 5.3 g/dL — ABNORMAL LOW (ref 6.5–8.1)

## 2019-02-05 LAB — CBC
HCT: 36.4 % (ref 36.0–46.0)
Hemoglobin: 11.2 g/dL — ABNORMAL LOW (ref 12.0–15.0)
MCH: 27 pg (ref 26.0–34.0)
MCHC: 30.8 g/dL (ref 30.0–36.0)
MCV: 87.7 fL (ref 80.0–100.0)
NRBC: 0 % (ref 0.0–0.2)
Platelets: 348 10*3/uL (ref 150–400)
RBC: 4.15 MIL/uL (ref 3.87–5.11)
RDW: 16.1 % — ABNORMAL HIGH (ref 11.5–15.5)
WBC: 5.7 10*3/uL (ref 4.0–10.5)

## 2019-02-05 LAB — GLUCOSE, CAPILLARY
Glucose-Capillary: 168 mg/dL — ABNORMAL HIGH (ref 70–99)
Glucose-Capillary: 108 mg/dL — ABNORMAL HIGH (ref 70–99)
Glucose-Capillary: 110 mg/dL — ABNORMAL HIGH (ref 70–99)
Glucose-Capillary: 122 mg/dL — ABNORMAL HIGH (ref 70–99)
Glucose-Capillary: 140 mg/dL — ABNORMAL HIGH (ref 70–99)

## 2019-02-05 LAB — PROTIME-INR
INR: 1.93
Prothrombin Time: 21.8 seconds — ABNORMAL HIGH (ref 11.4–15.2)

## 2019-02-05 NOTE — Plan of Care (Signed)

## 2019-02-05 NOTE — Care Management Important Message (Signed)
Important Message  Patient Details  Name: Kaylee Wolf MRN: [id number] Date of Birth: [birth year]   Medicare Important Message Given:  Yes    Juliann Pulse A Darling Cieslewicz 02/05/2019, 11:41 AM

## 2019-02-05 NOTE — Progress Notes (Signed)
[place] at [place] NAME: Kaylee Wolf    MR#:  [id number]  DATE OF BIRTH:  05-23-1951  SUBJECTIVE:   Patient is tolerating full liquid diet, denies any nausea vomiting liver biopsy scheduled for Monday.  Out of bed to chair at her baseline she is bedbound as per patient's report, refusing to go to rehab center, agreeable with home health PT  REVIEW OF SYSTEMS:  Review of Systems  Constitutional: Negative for chills and fever.  HENT: Negative for congestion and sore throat.   Eyes: Negative for blurred vision and double vision.  Respiratory: Negative for cough and shortness of breath.   Cardiovascular: Negative for chest pain and palpitations.  Gastrointestinal: Positive for abdominal pain. Negative for nausea and vomiting.  Genitourinary: Negative for dysuria and urgency.  Musculoskeletal: Negative for back pain and neck pain.  Neurological: Negative for dizziness and headaches.  Psychiatric/Behavioral: Negative for depression. The patient is not nervous/anxious.     DRUG ALLERGIES:  No Known Allergies VITALS:  Blood pressure 121/62, pulse 95, temperature 98.2 F (36.8 C), temperature source Oral, resp. rate 20, height 5\' 4"  (1.626 m), weight 86.1 kg, last menstrual period 05/10/2012, SpO2 95 %. PHYSICAL EXAMINATION:  Physical Exam  GENERAL:  68 y.o.-year-old patient lying in the bed with no acute distress.   EYES: Pupils equal, round, reactive to light and accommodation. No scleral icterus. Extraocular muscles intact.  HEENT: Head atraumatic, normocephalic. Oropharynx and nasopharynx clear.  NECK:  Supple, no jugular venous distention. No thyroid enlargement, no tenderness.  LUNGS: Normal breath sounds bilaterally, no wheezing, rales,rhonchi or crepitation. No use of accessory muscles of respiration. [place] in place. CARDIOVASCULAR: RRR, S1, S2 normal. No murmurs, rubs, or gallops.  ABDOMEN: Soft, nondistended. Bowel sounds present.  No organomegaly or mass. +mildly RUQ and epigastric tenderness to palpation. EXTREMITIES: No pedal edema, cyanosis, or clubbing.  NEUROLOGIC: Awake, alert and oriented x3 gait not checked.  PSYCHIATRIC: The patient is alert and oriented x 3.  SKIN: No obvious rash, lesion, or ulcer.  LABORATORY PANEL:  Female CBC Recent Labs  Lab 02/05/19 0332  WBC 5.7  HGB 11.2*  HCT 36.4  PLT 348   ------------------------------------------------------------------------------------------------------------------ Chemistries  Recent Labs  Lab 02/05/19 0332  NA 141  K 3.1*  CL 108  CO2 29  GLUCOSE 105*  BUN 5*  CREATININE 0.49  CALCIUM 7.7*  AST 35  ALT 24  ALKPHOS 473*  BILITOT 0.8   RADIOLOGY:  No results found. 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 - Cells for cytology obtained in the upper third of the main bile duct during ERCP -Radiology technician call her today and said that they are canceling liver biopsy and cytology from main bile duct done during ERCP is pending at this point -MRI of liver with and without contrast ordered as recommended by radiology -Oncology consult-Dr. Janese Banks is going to see the patient after discharge -GI has signed off- needs to f/u with Dr. Allen Norris in clinic for stent removal/exchange -IV antiemetics -Pain control prn -IVFs -Soft diet and will advance as tolerated  Abnormal UA- UA with moderate leukocytes, but rare bacteria. Patient is asymptomatic, afebrile, and does not have a leukocytosis. -Not considering antibiotics as patient's urine culture has revealed multiple species which is a contaminated specimen  Epilepsy- has not been taking meds for the last  3 weeks due to not feeling well. Concern for some seizure-like activity at home. Patient has not had any seizure-like activity  here. -Continue home depakote, gabapentin, lamictal, and phenytoin  Type 2 diabetes- on metformin at home -SSI  Hyperlipidemia- stable -Continue home lipitor  Depression- stable -Continue home lexapro  All the records are reviewed and case discussed with Care Management/Social Worker. Management plans discussed with the patient she is in agreement.  CODE STATUS: Full Code  TOTAL TIME TAKING CARE OF THIS PATIENT: 33 minutes.   More than 50% of the time was spent in counseling/coordination of care: YES  POSSIBLE D/C IN 1-2 DAYS, DEPENDING ON CLINICAL CONDITION.   Nicholes Mango M.D on 02/05/2019 at 3:51 PM  Between 7am to 6pm - Pager - [phone]  After 6pm go to [email or web address] - Proofreader  [facility]  Office  [phone]  CC: Primary care physician; Remi Haggard, FNP  Note: This dictation was prepared with Dragon dictation along with smaller phrase technology. Any transcriptional errors that result from this process are unintentional.

## 2019-02-06 ENCOUNTER — Ambulatory Visit: Payer: Medicare HMO | Admitting: Surgery

## 2019-02-06 ENCOUNTER — Other Ambulatory Visit: Payer: Self-pay | Admitting: *Deleted

## 2019-02-06 DIAGNOSIS — K769 Liver disease, unspecified: Secondary | ICD-10-CM

## 2019-02-06 LAB — GLUCOSE, CAPILLARY
GLUCOSE-CAPILLARY: 99 mg/dL (ref 70–99)
Glucose-Capillary: 116 mg/dL — ABNORMAL HIGH (ref 70–99)

## 2019-02-06 MED ORDER — OXYCODONE HCL 5 MG PO TABS: 5.0000 mg | ORAL_TABLET | Freq: Four times a day (QID) | ORAL | 0 refills | Status: DC | PRN

## 2019-02-06 MED ORDER — POLYETHYLENE GLYCOL 3350 17 G PO PACK: 17.0000 g | PACK | Freq: Every day | ORAL | 0 refills | Status: AC | PRN

## 2019-02-06 MED ORDER — GADOBUTROL 1 MMOL/ML IV SOLN
8.0000 mL | Freq: Once | INTRAVENOUS | Status: AC | PRN
  Administered 2019-02-06: 8 mL via INTRAVENOUS

## 2019-02-06 MED ORDER — ACETAMINOPHEN 325 MG PO TABS: 650.0000 mg | ORAL_TABLET | Freq: Three times a day (TID) | ORAL | Status: AC | PRN

## 2019-02-06 NOTE — Care Management (Signed)
Met with patient to assess any further needs for Home DC, Patient needs a 3 in 1 and a RW, notified Corene Cornea with [facility], the patient verified that she has chosen [facility] for [facility] PT Notified Corene Cornea of the request.  Patient states her daughter and grad daughter will be helping her at home, no further needs

## 2019-02-06 NOTE — Discharge Instructions (Signed)
Follow-up with primary care physician in 3 days Follow-up with oncology Dr. Janese Banks as recommended on in a week Home health PT

## 2019-02-06 NOTE — Discharge Summary (Addendum)
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 Anxiety   . Colon polyp 11/29/2012   at [place].  . Insomnia   . Mixed hyperlipidemia   . Recurrent major depressive disorder ([place])   . RLS (restless legs syndrome)   . Seizures ([place])   . Tinea pedis of both feet   . Type 2 diabetes mellitus ([place]) 2014?  [name] Varicose veins     HOSPITAL COURSE:  HPI  Kaylee Wolf  is a 68 y.o. female with a known history of epilepsy, type 2 diabetes, hyperlipidemia, depression presented to the ED with generalized abdominal pain, cough, rhinorrhea, and increased seizure-like activity.  Her cough and runny nose started about 5 days ago.  She was taking Mucinex for this, which did not help.  Her family noticed increased seizure-like activity over the last couple of days.  During her seizures she typically "zones out" and then becomes very confused.  Family has also noticed that she has been sleeping a lot recently and has had some mild confusion.  She endorses right upper quadrant and epigastric abdominal pain over the couple of weeks.  She endorses nausea and vomiting.  She  denies any fevers or chills.  She denies any chest pain or shortness of breath.  In the ED, she was requiring 2 L O2 by nasal cannula.  Vitals were otherwise unremarkable.  Labs were significant for alk phos 1171, AST 173, ALT 84.  UA with small hemoglobin, moderate leukocytes, rare bacteria.  Abdominal ultrasound showed 3 liver masses concerning for metastatic disease, cholelithiasis and sludge in the gallbladder.  CT abdomen pelvis showed intrahepatic biliary dilatation above a thickened enhancing common bile duct concerning for cholangiocarcinoma, in addition to right liver masses due to metastatic disease versus early biliary abscesses.  She also had a nodular omentum consistent with peritoneal carcinomatosis.  Hospitalists were called for admission.  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 - Cells for cytology obtained in the upper third of the main bile duct during ERCP-results are pending.  Patient might need outpatient liver biopsy as per my discussion with Dr. Janese Banks, she is discussing with pathologist -MRI of liver with and without contrast ordered as recommended by radiology-reveals multiple malignant appearing lesions in the liver -Oncology consult-Dr. Janese Banks is going to see the patient after discharge, their office staff will call and schedule follow-up appointment ASAP -GI has signed off- needs to f/u with Dr. Allen Norris in clinic for stent removal/exchange -Supportive treatment provided during the hospital course with IV fluids, IV antiemetics -Pain control prn Patient is tolerating diet  Abnormal UA- UA with moderate leukocytes, but rare bacteria. Patient  is asymptomatic, afebrile, and does not have a leukocytosis. -Not considering antibiotics as patient's urine culture has revealed multiple species which is a contaminated  specimen  Epilepsy- has not been taking meds for the last 3 weeks due to not feeling well. Concern for some seizure-like activity at home.Patient has not had any seizure-like activity here. -Continue home depakote, gabapentin, lamictal, and phenytoin  Type 2 diabetes- on metformin at home -SSI  Hyperlipidemia- stable -Continue home lipitor  Depression- stable -Continue home lexapro  Disposition Home with home health   All the records are reviewed and case discussed with Care Management/Social Worker. Management plans discussed with the patient, her daughter [name] they are in agreement. DISCHARGE CONDITIONS:   Stable   CONSULTS OBTAINED:  Treatment Team:  Sindy Guadeloupe, MD   PROCEDURES ERCP   DRUG ALLERGIES:  No Known Allergies  DISCHARGE MEDICATIONS:   Allergies as of 02/06/2019   No Known Allergies     Medication List    TAKE these medications   acetaminophen 325 MG tablet Commonly known as:  TYLENOL Take 2 tablets (650 mg total) by mouth every 8 (eight) hours as needed for mild pain (or Fever >/= 101).   aspirin 81 MG EC tablet Take 1 tablet (81 mg total) by mouth daily.   atorvastatin 20 MG tablet Commonly known as:  LIPITOR Take 1 tablet (20 mg total) by mouth daily.   blood glucose meter kit and supplies Kit Dispense based on patient and insurance preference. Check blood glucose once daily.   divalproex 125 MG DR tablet Commonly known as:  DEPAKOTE Take 1 pill in am and 2 pill at night   escitalopram 20 MG tablet Commonly known as:  LEXAPRO Take 1 tablet (20 mg total) by mouth daily.   gabapentin 400 MG capsule Commonly known as:  NEURONTIN Take 1 capsule (400 mg total) by mouth 2 (two) times daily.   lamoTRIgine 150 MG tablet Commonly known as:  LAMICTAL Take 150 mg by mouth 2 (two) times daily.   LINZESS 72 MCG capsule Generic drug:  linaclotide Take 72 mcg by mouth as needed.   metFORMIN 500 MG tablet Commonly known as:   GLUCOPHAGE Take 1 tablet (500 mg total) by mouth 2 (two) times daily.   multivitamin tablet Take 1 tablet by mouth daily.   omeprazole 20 MG capsule Commonly known as:  PRILOSEC Take 1 capsule (20 mg total) by mouth daily. Before 30 min before first meal of day   ondansetron 4 MG disintegrating tablet Commonly known as:  ZOFRAN-ODT Take 1 tablet (4 mg total) by mouth 2 (two) times daily.   oxyCODONE 5 MG immediate release tablet Commonly known as:  Oxy IR/ROXICODONE Take 1 tablet (5 mg total) by mouth every 6 (six) hours as needed for moderate pain.   phenytoin 200 MG ER capsule Commonly known as:  DILANTIN Take 200 mg by mouth 2 (two) times daily.   polyethylene glycol packet Commonly known as:  MIRALAX / GLYCOLAX Take 17 g by mouth daily as needed for mild constipation.   Vitamin D-3 25 MCG (1000 UT) Caps Take 1 capsule by mouth daily.            Durable Medical Equipment  (From admission, onward)         Start     Ordered   02/05/19 0855  For home use only DME Walker rolling  Once    Comments:  RW with 5 inch wheels  Question:  Patient  needs a walker to treat with the following condition  Answer:  Weakness   02/05/19 0855           DISCHARGE INSTRUCTIONS:   Follow-up with primary care physician in 3 days Follow-up with oncology Dr. Janese Banks as recommended on in a week Home health PT   DIET:  Diabetic diet  DISCHARGE CONDITION:  Fair  ACTIVITY:  Activity as tolerated  OXYGEN:  Home Oxygen: no   Oxygen Delivery:  no  DISCHARGE LOCATION:  home   If you experience worsening of your admission symptoms, develop shortness of breath, life threatening emergency, suicidal or homicidal thoughts you must seek medical attention immediately by calling 911 or calling your MD immediately  if symptoms less severe.  You Must read complete instructions/literature along with all the possible adverse reactions/side effects for all the Medicines you take and that  have been prescribed to you. Take any new Medicines after you have completely understood and accpet all the possible adverse reactions/side effects.   Please note  You were cared for by a hospitalist during your hospital stay. If you have any questions about your discharge medications or the care you received while you were in the hospital after you are discharged, you can call the unit and asked to speak with the hospitalist on call if the hospitalist that took care of you is not available. Once you are discharged, your primary care physician will handle any further medical issues. Please note that NO REFILLS for any discharge medications will be authorized once you are discharged, as it is imperative that you return to your primary care physician (or establish a relationship with a primary care physician if you do not have one) for your aftercare needs so that they can reassess your need for medications and monitor your lab values.     Today  Chief Complaint  Patient presents with  . Nasal Congestion  . Seizures   Patient is tolerating diet denies any nausea vomiting.  Denies any abdominal pain wants to go home  ROS:  CONSTITUTIONAL: Denies fevers, chills. Denies any fatigue, weakness.  EYES: Denies blurry vision, double vision, eye pain. EARS, NOSE, THROAT: Denies tinnitus, ear pain, hearing loss. RESPIRATORY: Denies cough, wheeze, shortness of breath.  CARDIOVASCULAR: Denies chest pain, palpitations, edema.  GASTROINTESTINAL: Denies nausea, vomiting, diarrhea, abdominal pain. Denies bright red blood per rectum. GENITOURINARY: Denies dysuria, hematuria. ENDOCRINE: Denies nocturia or thyroid problems. HEMATOLOGIC AND LYMPHATIC: Denies easy bruising or bleeding. SKIN: Denies rash or lesion. MUSCULOSKELETAL: Denies pain in neck, back, shoulder, knees, hips or arthritic symptoms.  NEUROLOGIC: Denies paralysis, paresthesias.  PSYCHIATRIC: Denies anxiety or depressive symptoms.   VITAL  SIGNS:  Blood pressure 131/69, pulse 95, temperature 98.7 F (37.1 C), temperature source Oral, resp. rate 18, height '5\' 4"'  (1.626 m), weight 86.1 kg, last menstrual period 05/10/2012, SpO2 96 %.  I/O:    Intake/Output Summary (Last 24 hours) at 02/06/2019 1126 Last data filed at 02/06/2019 1100 Gross per 24 hour  Intake 3377.06 ml  Output -  Net 3377.06 ml    PHYSICAL EXAMINATION:  GENERAL:  68 y.o.-year-old patient lying in the bed with no acute distress.  EYES: Pupils equal, round, reactive to light and accommodation. No scleral icterus. Extraocular muscles intact.  HEENT: Head atraumatic, normocephalic. Oropharynx and nasopharynx clear.  NECK:  Supple, no jugular venous distention. No thyroid enlargement, no tenderness.  LUNGS: Normal breath sounds bilaterally, no wheezing, rales,rhonchi or crepitation. No use of accessory muscles of respiration.  CARDIOVASCULAR: S1, S2 normal. No murmurs, rubs, or gallops.  ABDOMEN: Soft, non-tender, non-distended. Bowel sounds present. No organomegaly or mass.  EXTREMITIES: No pedal edema, cyanosis, or clubbing.  NEUROLOGIC: Awake, alert and oriented x3. Sensation intact. Gait not checked.  PSYCHIATRIC: The patient is alert and oriented x 3.  SKIN: No obvious rash, lesion, or ulcer.   DATA REVIEW:   CBC Recent Labs  Lab 02/05/19 0332  WBC 5.7  HGB 11.2*  HCT 36.4  PLT 348    Chemistries  Recent Labs  Lab 02/05/19 0332  NA 141  K 3.1*  CL 108  CO2 29  GLUCOSE 105*  BUN 5*  CREATININE 0.49  CALCIUM 7.7*  AST 35  ALT 24  ALKPHOS 473*  BILITOT 0.8    Cardiac Enzymes No results for input(s): TROPONINI in the last 168 hours.  Microbiology Results  Results for orders placed or performed during the hospital encounter of 02/01/19  Urine Culture     Status: Abnormal   Collection Time: 01/31/19 10:22 PM  Result Value Ref Range Status   Specimen Description   Final    URINE, CLEAN CATCH Performed at [facility],  [address] [place], [place] [address]    Special Requests   Final    Normal Performed at [facility], [address] [place], [place] [address]    Culture MULTIPLE SPECIES PRESENT, SUGGEST RECOLLECTION (A)  Final   Report Status 02/02/2019 FINAL  Final    RADIOLOGY:  Mr Liver W Wo Contrast  Result Date: 02/06/2019 CLINICAL DATA:  68 year old female with history of abnormal CT scan concerning for potential cholangiocarcinoma. Follow-up study. EXAM: MRI ABDOMEN WITHOUT AND WITH CONTRAST TECHNIQUE: Multiplanar multisequence MR imaging of the abdomen was performed both before and after the administration of intravenous contrast. CONTRAST:  8 mL of Gadavist. COMPARISON:  No prior abdominal MRI. CT the chest, abdomen and pelvis 02/01/2019. FINDINGS: Comment: Today's study is limited by considerable patient respiratory motion. Lower chest: T2 signal intensity dependently in the lower right hemithorax, compatible with a small to moderate right pleural effusion, likely with some associated passive atelectasis in the right lower lobe. Hepatobiliary: There are multiple hepatic lesions predominantly throughout the right lobe of the liver. The largest of these is in segment 6 (axial image 26 of series 5) measuring 2.4 x 3.7 cm. These lesions are all T1 hypointense, heterogeneously T2 hyperintense, demonstrate diffusion restriction, and are hypovascular with peripheral enhancement on post gadolinium imaging, highly concerning for metastatic lesions. MRCP imaging was not performed. However, the previously noted intrahepatic biliary ductal dilatation evident on CT examination 02/01/2019 is no longer noted on today's study. Mild T2 hyperintensity in the periportal aspects of the hepatic parenchyma, suggesting periportal edema. There is a subtle signal void in the common bile duct, likely reflective of an indwelling common bile duct stent. Some dependent T2 hypointensity and multiple well-defined  rounded T2 hypo intensities are noted within the gallbladder, compatible with a combination of biliary sludge and gallstones. Gallbladder does not appear distended. Gallbladder wall does not appear thickened. Pancreas: No pancreatic mass. No pancreatic ductal dilatation. No pancreatic or peripancreatic fluid or inflammatory changes. Spleen: Well-defined T1 hypointense, T2 hyperintense, nonenhancing lesion in the anterior aspect of the spleen, corresponding to rim calcified structure on recent CT examination, likely sequela of remote splenic trauma. Spleen is otherwise unremarkable in appearance. Adrenals/Urinary Tract: Bilateral kidneys and adrenal glands are normal in appearance. No hydroureteronephrosis in the visualized portions of the abdomen.  Stomach/Bowel: Visualized portions are unremarkable. Vascular/Lymphatic: No aneurysm identified in the visualized abdominal vasculature. Mildly enlarged portacaval lymph node measuring up to 1.6 cm in short axis (axial image forty-nine of series 13). Other:  Small volume of ascites most evident inferior to the liver. Musculoskeletal: No aggressive appearing osseous lesions are noted in the visualized portions of the skeleton. IMPRESSION: 1. Multiple malignant appearing lesions throughout the right lobe of the liver, most likely to reflect metastatic lesions. This is associated with some portacaval lymphadenopathy. 2. Previously noted intrahepatic biliary ductal dilatation has resolved following placement of common bile duct stent and sphincterotomy. 3. Cholelithiasis and biliary sludge in the gallbladder. No findings to suggest an acute cholecystitis at this time. 4. Trace volume of ascites. Electronically Signed   By: Vinnie Langton M.D.   On: 02/06/2019 06:42    EKG:   Orders placed or performed during the hospital encounter of 08/22/16  . ED EKG  . ED EKG  . EKG 12-Lead  . EKG 12-Lead      Management plans discussed with the patient, family and they are  in agreement.  CODE STATUS:     Code Status Orders  (From admission, onward)         Start     Ordered   02/01/19 0958  Full code  Continuous     02/01/19 0957        Code Status History    Date Active Date Inactive Code Status Order ID Comments User Context   02/02/2018 0339 02/04/2018 1924 Full Code [id number]  Harrie Foreman, MD Inpatient      TOTAL TIME TAKING CARE OF THIS PATIENT: 45  minutes.   Note: This dictation was prepared with Dragon dictation along with smaller phrase technology. Any transcriptional errors that result from this process are unintentional.   '@M[facility]' @  on 02/06/2019 at 11:26 AM  Between 7am to 6pm - Pager - [phone]  After 6pm go to [email or web address] - password [email or web address] [facility]  Office  [phone]  CC: Primary care physician; Remi Haggard, FNP

## 2019-02-06 NOTE — Progress Notes (Signed)
SATURATION QUALIFICATIONS: (This note is used to comply with regulatory documentation for home oxygen)  Patient Saturations on Room Air at Rest = 94%  Patient Saturations on Room Air while Ambulating = pt unable to ambulate   Please briefly explain why patient needs home oxygen: Pt does not qualify for home O2.

## 2019-02-07 ENCOUNTER — Encounter: Payer: Self-pay | Admitting: Gastroenterology

## 2019-02-08 ENCOUNTER — Other Ambulatory Visit: Payer: Self-pay

## 2019-02-08 ENCOUNTER — Inpatient Hospital Stay: Payer: Medicare HMO | Attending: Oncology | Admitting: Oncology

## 2019-02-08 ENCOUNTER — Inpatient Hospital Stay: Payer: Medicare HMO | Admitting: Oncology

## 2019-02-08 ENCOUNTER — Encounter: Payer: Self-pay | Admitting: Oncology

## 2019-02-08 VITALS — BP 108/67 | HR 103 | Temp 98.0°F | Resp 18 | Ht 66.93 in | Wt 197.7 lb

## 2019-02-08 DIAGNOSIS — E782 Mixed hyperlipidemia: Secondary | ICD-10-CM | POA: Insufficient documentation

## 2019-02-08 DIAGNOSIS — E119 Type 2 diabetes mellitus without complications: Secondary | ICD-10-CM | POA: Diagnosis not present

## 2019-02-08 DIAGNOSIS — Z7984 Long term (current) use of oral hypoglycemic drugs: Secondary | ICD-10-CM | POA: Diagnosis not present

## 2019-02-08 DIAGNOSIS — Z7189 Other specified counseling: Secondary | ICD-10-CM

## 2019-02-08 DIAGNOSIS — C786 Secondary malignant neoplasm of retroperitoneum and peritoneum: Secondary | ICD-10-CM | POA: Insufficient documentation

## 2019-02-08 DIAGNOSIS — Z7982 Long term (current) use of aspirin: Secondary | ICD-10-CM | POA: Diagnosis not present

## 2019-02-08 DIAGNOSIS — Z79899 Other long term (current) drug therapy: Secondary | ICD-10-CM | POA: Insufficient documentation

## 2019-02-08 DIAGNOSIS — C801 Malignant (primary) neoplasm, unspecified: Secondary | ICD-10-CM

## 2019-02-08 DIAGNOSIS — C221 Intrahepatic bile duct carcinoma: Secondary | ICD-10-CM | POA: Diagnosis present

## 2019-02-08 DIAGNOSIS — C787 Secondary malignant neoplasm of liver and intrahepatic bile duct: Secondary | ICD-10-CM | POA: Diagnosis not present

## 2019-02-08 LAB — CYTOLOGY - NON PAP

## 2019-02-08 NOTE — Progress Notes (Signed)
Here for hospital l follow up visit

## 2019-02-09 ENCOUNTER — Telehealth: Payer: Self-pay | Admitting: *Deleted

## 2019-02-09 NOTE — Telephone Encounter (Signed)
Daughter called today to let us know that the patient did not want to go ahead and proceed with chemo.  Splane to the daughter that we will need to get an appointment through vascular for her to have a port put in, she will also have to come to chemo class and then will make an appointment with her to have lab work see the doctor and start treatment as soon as we get the chemo class and the port.  Daughter was agreeable to this plan and when I get it set up I will let her know.  I did asked whether or not she is going to go ahead with the liver biopsy and they chose no.  I will inform that department that worker to cancel it

## 2019-02-12 ENCOUNTER — Other Ambulatory Visit (INDEPENDENT_AMBULATORY_CARE_PROVIDER_SITE_OTHER): Payer: Self-pay | Admitting: Vascular Surgery

## 2019-02-12 ENCOUNTER — Telehealth: Payer: Self-pay | Admitting: *Deleted

## 2019-02-12 ENCOUNTER — Encounter: Payer: Self-pay | Admitting: Oncology

## 2019-02-12 DIAGNOSIS — Z7189 Other specified counseling: Secondary | ICD-10-CM | POA: Insufficient documentation

## 2019-02-12 DIAGNOSIS — C801 Malignant (primary) neoplasm, unspecified: Secondary | ICD-10-CM

## 2019-02-12 DIAGNOSIS — C787 Secondary malignant neoplasm of liver and intrahepatic bile duct: Secondary | ICD-10-CM

## 2019-02-12 DIAGNOSIS — C221 Intrahepatic bile duct carcinoma: Secondary | ICD-10-CM

## 2019-02-12 DIAGNOSIS — C786 Secondary malignant neoplasm of retroperitoneum and peritoneum: Secondary | ICD-10-CM | POA: Insufficient documentation

## 2019-02-12 MED ORDER — PROCHLORPERAZINE MALEATE 10 MG PO TABS: 10.0000 mg | ORAL_TABLET | Freq: Four times a day (QID) | ORAL | 1 refills | Status: AC | PRN

## 2019-02-12 MED ORDER — LIDOCAINE-PRILOCAINE 2.5-2.5 % EX CREA: TOPICAL_CREAM | CUTANEOUS | 3 refills | Status: AC

## 2019-02-12 MED ORDER — ONDANSETRON HCL 8 MG PO TABS: 8.0000 mg | ORAL_TABLET | Freq: Two times a day (BID) | ORAL | 1 refills | Status: AC | PRN

## 2019-02-12 MED ORDER — DEXAMETHASONE 4 MG PO TABS: ORAL_TABLET | ORAL | 1 refills | Status: AC

## 2019-02-12 NOTE — Progress Notes (Signed)
START OFF PATHWAY REGIMEN - [Other Dx]   [id number]:Cisplatin 25 mg/m2 D1,8 + Gemcitabine 1,000 mg/m2 D1,8 q21 Days:   A cycle is every 21 days:     Gemcitabine      Cisplatin   **Always confirm dose/schedule in your pharmacy ordering system**  Patient Characteristics: Intent of Therapy: Non-Curative / Palliative Intent, Discussed with Patient 

## 2019-02-12 NOTE — Progress Notes (Signed)
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  She was found to have an elevated bilirubin of 3.3.  Patient had CT chest abdomen and pelvis done in the ER which showed intrahepatic biliary dilatation and thickened enhancing CBD concerning for cholangiocarcinoma.  Indistinct right liver masses favor metastatic disease over early biliary abscesses.  Nodular omentum most consistent with peritoneal carcinomatosis.  ERCP showed a malignant appearing stricture and brushings were positive for adenocarcinoma but site of malignancy could not be ascertained due to limited specimen.  Bilirubin normalized after stent placement  Interval history-patient lives with her daughter and needs assistance with her ADLs.  She is ambulating with a walker and since discharge from the hospital she reports doing relatively well.  Appetite is fair she has not had any further weight loss.  Denies any abdominal pain today.  She has chronic fatigue  ECOG PS- 2 Pain scale- 0 Opioid associated constipation- no  Review of systems- Review of Systems  Constitutional: Positive for malaise/fatigue. Negative for chills, fever and weight loss.  HENT: Negative for congestion, ear discharge and nosebleeds.   Eyes: Negative for blurred vision.  Respiratory: Negative for cough, hemoptysis, sputum production, shortness of breath and wheezing.     Cardiovascular: Negative for chest pain, palpitations, orthopnea and claudication.  Gastrointestinal: Negative for abdominal pain, blood in stool, constipation, diarrhea, heartburn, melena, nausea and vomiting.  Genitourinary: Negative for dysuria, flank pain, frequency, hematuria and urgency.  Musculoskeletal: Negative for back pain, joint pain and myalgias.  Skin: Negative for rash.  Neurological: Negative for dizziness, tingling, focal weakness, seizures, weakness and headaches.  Endo/Heme/Allergies: Does not bruise/bleed easily.  Psychiatric/Behavioral: Negative for depression and suicidal ideas. The patient does not have insomnia.       No Known Allergies   Past Medical History:  Diagnosis Date  . Anxiety   . Colon polyp 11/29/2012   at [place].  . Insomnia   . Mixed hyperlipidemia   . Recurrent major depressive disorder ([place])   . RLS (restless legs syndrome)   . Seizures ([place])   . Tinea pedis of both feet   . Type 2 diabetes mellitus ([place]) 2014?  [name] Varicose veins      Past Surgical History:  Procedure Laterality Date  . COLONOSCOPY  11/29/2012   Dr [name]  . ERCP N/A 02/01/2019   Procedure: ENDOSCOPIC RETROGRADE CHOLANGIOPANCREATOGRAPHY (ERCP);  Surgeon: [name], MD;  Location: [facility] ENDOSCOPY;  Service: Endoscopy;  Laterality: N/A;  . none      Social History   Socioeconomic History  . Marital status: Widowed    Spouse name: Not on file  . Number of children: 1  . Years of education: Not on file  . Highest education level: Not on file  Occupational History  . Not on file  Social Needs  . Financial resource strain: Patient refused  . Food insecurity:    Worry: Patient refused  Inability: Patient refused  . Transportation needs:    Medical: Patient refused    Non-medical: Patient refused  Tobacco Use  . Smoking status: Never Smoker  . Smokeless tobacco: Never Used  Substance and Sexual Activity  .  Alcohol use: No    Alcohol/week: 0.0 standard drinks  . Drug use: No  . Sexual activity: Not Currently  Lifestyle  . Physical activity:    Days per week: Patient refused    Minutes per session: Patient refused  . Stress: Patient refused  Relationships  . Social connections:    Talks on phone: Patient refused    Gets together: Patient refused    Attends religious service: Patient refused    Active member of club or organization: Patient refused    Attends meetings of clubs or organizations: Patient refused    Relationship status: Patient refused  . Intimate partner violence:    Fear of current or ex partner: Patient refused    Emotionally abused: Patient refused    Physically abused: Patient refused    Forced sexual activity: Patient refused  Other Topics Concern  . Not on file  Social History Narrative  . Not on file    Family History  Problem Relation Age of Onset  . Diabetes Mother   . Heart disease Mother   . Cancer Father   . Diabetes Brother   . Hypertension Brother   . Stroke Brother   . Hypertension Brother   . Breast cancer Neg Hx      Current Outpatient Medications:  .  aspirin EC 81 MG EC tablet, Take 1 tablet (81 mg total) by mouth daily., Disp: 30 tablet, Rfl: 0 .  atorvastatin (LIPITOR) 20 MG tablet, Take 1 tablet (20 mg total) by mouth daily., Disp: 90 tablet, Rfl: 3 .  blood glucose meter kit and supplies KIT, Dispense based on patient and insurance preference. Check blood glucose once daily., Disp: 1 each, Rfl: 0 .  Cholecalciferol (VITAMIN D-3) 1000 UNITS CAPS, Take 1 capsule by mouth daily. , Disp: , Rfl:  .  divalproex (DEPAKOTE) 125 MG DR tablet, Take 1 pill in am and 2 pill at night, Disp: 90 tablet, Rfl: 1 .  escitalopram (LEXAPRO) 20 MG tablet, Take 1 tablet (20 mg total) by mouth daily., Disp: 30 tablet, Rfl: 11 .  furosemide (LASIX) 40 MG tablet, Take 40 mg by mouth daily. , Disp: , Rfl:  .  gabapentin (NEURONTIN) 400 MG capsule, Take 1  capsule (400 mg total) by mouth 2 (two) times daily., Disp: 180 capsule, Rfl: 3 .  lamoTRIgine (LAMICTAL) 150 MG tablet, Take 150 mg by mouth 2 (two) times daily. , Disp: , Rfl:  .  levothyroxine (SYNTHROID, LEVOTHROID) 50 MCG tablet, Take 50 mcg by mouth daily before breakfast. , Disp: , Rfl:  .  linaclotide (LINZESS) 72 MCG capsule, Take 72 mcg by mouth as needed., Disp: , Rfl:  .  metFORMIN (GLUCOPHAGE) 500 MG tablet, Take 1 tablet (500 mg total) by mouth 2 (two) times daily., Disp: 180 tablet, Rfl: 3 .  Multiple Vitamin (MULTIVITAMIN) tablet, Take 1 tablet by mouth daily., Disp: , Rfl:  .  omeprazole (PRILOSEC) 20 MG capsule, Take 1 capsule (20 mg total) by mouth daily. Before 30 min before first meal of day, Disp: 30 capsule, Rfl: 5 .  phenytoin (DILANTIN) 200 MG ER capsule, Take 100 mg by mouth 2 (two) times daily. , Disp: , Rfl:  .  potassium chloride (K-DUR,KLOR-CON) 10 MEQ tablet,  Take 10 mEq by mouth once. , Disp: , Rfl:  .  REXULTI 3 MG TABS, 3 mg every morning. , Disp: , Rfl:  .  acetaminophen (TYLENOL) 325 MG tablet, Take 2 tablets (650 mg total) by mouth every 8 (eight) hours as needed for mild pain (or Fever >/= 101). (Patient not taking: Reported on 02/08/2019), Disp: , Rfl:  .  nitrofurantoin (MACRODANTIN) 100 MG capsule, , Disp: , Rfl:  .  ondansetron (ZOFRAN-ODT) 4 MG disintegrating tablet, Take 1 tablet (4 mg total) by mouth 2 (two) times daily. (Patient not taking: Reported on 02/08/2019), Disp: 20 tablet, Rfl: 1 .  oxyCODONE (OXY IR/ROXICODONE) 5 MG immediate release tablet, Take 1 tablet (5 mg total) by mouth every 6 (six) hours as needed for moderate pain. (Patient not taking: Reported on 02/08/2019), Disp: 20 tablet, Rfl: 0 .  polyethylene glycol (MIRALAX / GLYCOLAX) packet, Take 17 g by mouth daily as needed for mild constipation. (Patient not taking: Reported on 02/08/2019), Disp: 14 each, Rfl: 0  Physical exam:  Vitals:   02/08/19 1348  BP: 108/67  Pulse: (!) 103  Resp:  18  Temp: 98 F (36.7 C)  TempSrc: Tympanic  Weight: 197 lb 11.2 oz (89.7 kg)  Height: 5' 6.93" (1.7 m)   Physical Exam Constitutional:      General: She is not in acute distress.    Comments: Appears fatigued.  Ambulates with a walker  HENT:     Head: Normocephalic and atraumatic.  Eyes:     Pupils: Pupils are equal, round, and reactive to light.  Neck:     Musculoskeletal: Normal range of motion.  Cardiovascular:     Rate and Rhythm: Normal rate and regular rhythm.     Heart sounds: Normal heart sounds.  Pulmonary:     Effort: Pulmonary effort is normal.     Breath sounds: Normal breath sounds.  Abdominal:     General: Bowel sounds are normal.     Palpations: Abdomen is soft.  Skin:    General: Skin is warm and dry.  Neurological:     Mental Status: She is alert and oriented to person, place, and time.      CMP Latest Ref Rng & Units 02/05/2019  Glucose 70 - 99 mg/dL 105(H)  BUN 8 - 23 mg/dL 5(L)  Creatinine 0.44 - 1.00 mg/dL 0.49  Sodium 135 - 145 mmol/L 141  Potassium 3.5 - 5.1 mmol/L 3.1(L)  Chloride 98 - 111 mmol/L 108  CO2 22 - 32 mmol/L 29  Calcium 8.9 - 10.3 mg/dL 7.7(L)  Total Protein 6.5 - 8.1 g/dL 5.3(L)  Total Bilirubin 0.3 - 1.2 mg/dL 0.8  Alkaline Phos 38 - 126 U/L 473(H)  AST 15 - 41 U/L 35  ALT 0 - 44 U/L 24   CBC Latest Ref Rng & Units 02/05/2019  WBC 4.0 - 10.5 K/uL 5.7  Hemoglobin 12.0 - 15.0 g/dL 11.2(L)  Hematocrit 36.0 - 46.0 % 36.4  Platelets 150 - 400 K/uL 348    No images are attached to the encounter.  Ct Chest W Contrast  Result Date: 02/01/2019 CLINICAL DATA:  Cough with persistent shortness of breath. EXAM: CT CHEST, ABDOMEN, AND PELVIS WITH CONTRAST TECHNIQUE: Multidetector CT imaging of the chest, abdomen and pelvis was performed following the standard protocol during bolus administration of intravenous contrast. CONTRAST:  153m ISOVUE-300 IOPAMIDOL (ISOVUE-300) INJECTION 61% COMPARISON:  Right upper quadrant ultrasound earlier  today FINDINGS: CT CHEST FINDINGS Cardiovascular: Normal heart size. No  pericardial effusion. Coronary atherosclerosis. Mild aortic atherosclerosis. Mediastinum/Nodes: Negative for adenopathy. Lungs/Pleura: Mild dependent atelectasis. No consolidation or nodularity Musculoskeletal: No acute or aggressive finding. Generalized spondylosis. CT ABDOMEN PELVIS FINDINGS Hepatobiliary: 4 or 5 indistinct liver masses centered on the right, measuring up to 2.3 cm. There is cholelithiasis and gallbladder wall thickening with mild fat indistinctness. Murphy sign was negative on prior ultrasound. There is intrahepatic biliary duct dilatation above a circumferentially thickened common bile duct. Small subcapsular fluid collection along the right liver without serosal enhancement, favor loculated peritoneal fluid. Pancreas: No evident mass or inflammation Spleen: Eggshell calcification at the hilum measuring 2.3 cm, compatible with remote insult. Adrenals/Urinary Tract: Negative adrenals. No hydronephrosis or stone. Negative bladder. Stomach/Bowel: No evident obstruction, inflammation, or mass. Vascular/Lymphatic: Prominent but not strictly enlarged lymph nodes in the deep liver drainage. Reproductive: Negative Other: Small volume ascites. Nodular appearance of the omentum including omentum that is herniated through a fatty umbilical hernia. Musculoskeletal: No acute or aggressive finding. Advanced lumbar facet degeneration. IMPRESSION: 1. 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 Please correlate for cholangitis symptoms. 2. Indistinct right liver masses, favor metastatic disease over early biliary abscesses. 3. Nodular omentum most consistent with peritoneal carcinomatosis. 4. Cholelithiasis. 5. No acute or malignant finding in the chest. Electronically Signed   By: Monte Fantasia M.D.   On: 02/01/2019 06:34    Ct Abdomen Pelvis W Contrast  Result Date: 02/01/2019 CLINICAL DATA:  Cough with persistent shortness of breath. EXAM: CT CHEST, ABDOMEN, AND PELVIS WITH CONTRAST TECHNIQUE: Multidetector CT imaging of the chest, abdomen and pelvis was performed following the standard protocol during bolus administration of intravenous contrast. CONTRAST:  134m ISOVUE-300 IOPAMIDOL (ISOVUE-300) INJECTION 61% COMPARISON:  Right upper quadrant ultrasound earlier today FINDINGS: CT CHEST FINDINGS Cardiovascular: Normal heart size. No pericardial effusion. Coronary atherosclerosis. Mild aortic atherosclerosis. Mediastinum/Nodes: Negative for adenopathy. Lungs/Pleura: Mild dependent atelectasis. No consolidation or nodularity Musculoskeletal: No acute or aggressive finding. Generalized spondylosis. CT ABDOMEN PELVIS FINDINGS Hepatobiliary: 4 or 5 indistinct liver masses centered on the right, measuring up to 2.3 cm. There is cholelithiasis and gallbladder wall thickening with mild fat indistinctness. Murphy sign was negative on prior ultrasound. There is intrahepatic biliary duct dilatation above a circumferentially thickened common bile duct. Small subcapsular fluid collection along the right liver without serosal enhancement, favor loculated peritoneal fluid. Pancreas: No evident mass or inflammation Spleen: Eggshell calcification at the hilum measuring 2.3 cm, compatible with remote insult. Adrenals/Urinary Tract: Negative adrenals. No hydronephrosis or stone. Negative bladder. Stomach/Bowel: No evident obstruction, inflammation, or mass. Vascular/Lymphatic: Prominent but not strictly enlarged lymph nodes in the deep liver drainage. Reproductive: Negative Other: Small volume ascites. Nodular appearance of the omentum including omentum that is herniated through a fatty umbilical hernia. Musculoskeletal: No acute or aggressive finding. Advanced lumbar facet degeneration. IMPRESSION: 1. Intrahepatic biliary dilatation of above a  thickened enhancing common bile duct primarily concerning for cholangiocarcinoma given constellation of findings. Please correlate for cholangitis symptoms. 2. Indistinct right liver masses, favor metastatic disease over early biliary abscesses. 3. Nodular omentum most consistent with peritoneal carcinomatosis. 4. Cholelithiasis. 5. No acute or malignant finding in the chest. Electronically Signed   By: JMonte FantasiaM.D.   On: 02/01/2019 06:34   Mr Liver W Wo Contrast  Result Date: 02/06/2019 CLINICAL DATA:  68year old female with history of abnormal CT scan concerning for potential cholangiocarcinoma. Follow-up study. EXAM: MRI ABDOMEN WITHOUT AND WITH CONTRAST  TECHNIQUE: Multiplanar multisequence MR imaging of the abdomen was performed both before and after the administration of intravenous contrast. CONTRAST:  8 mL of Gadavist. COMPARISON:  No prior abdominal MRI. CT the chest, abdomen and pelvis 02/01/2019. FINDINGS: Comment: Today's study is limited by considerable patient respiratory motion. Lower chest: T2 signal intensity dependently in the lower right hemithorax, compatible with a small to moderate right pleural effusion, likely with some associated passive atelectasis in the right lower lobe. Hepatobiliary: There are multiple hepatic lesions predominantly throughout the right lobe of the liver. The largest of these is in segment 6 (axial image 26 of series 5) measuring 2.4 x 3.7 cm. These lesions are all T1 hypointense, heterogeneously T2 hyperintense, demonstrate diffusion restriction, and are hypovascular with peripheral enhancement on post gadolinium imaging, highly concerning for metastatic lesions. MRCP imaging was not performed. However, the previously noted intrahepatic biliary ductal dilatation evident on CT examination 02/01/2019 is no longer noted on today's study. Mild T2 hyperintensity in the periportal aspects of the hepatic parenchyma, suggesting periportal edema. There is a subtle  signal void in the common bile duct, likely reflective of an indwelling common bile duct stent. Some dependent T2 hypointensity and multiple well-defined rounded T2 hypo intensities are noted within the gallbladder, compatible with a combination of biliary sludge and gallstones. Gallbladder does not appear distended. Gallbladder wall does not appear thickened. Pancreas: No pancreatic mass. No pancreatic ductal dilatation. No pancreatic or peripancreatic fluid or inflammatory changes. Spleen: Well-defined T1 hypointense, T2 hyperintense, nonenhancing lesion in the anterior aspect of the spleen, corresponding to rim calcified structure on recent CT examination, likely sequela of remote splenic trauma. Spleen is otherwise unremarkable in appearance. Adrenals/Urinary Tract: Bilateral kidneys and adrenal glands are normal in appearance. No hydroureteronephrosis in the visualized portions of the abdomen. Stomach/Bowel: Visualized portions are unremarkable. Vascular/Lymphatic: No aneurysm identified in the visualized abdominal vasculature. Mildly enlarged portacaval lymph node measuring up to 1.6 cm in short axis (axial image forty-nine of series 13). Other:  Small volume of ascites most evident inferior to the liver. Musculoskeletal: No aggressive appearing osseous lesions are noted in the visualized portions of the skeleton. IMPRESSION: 1. Multiple malignant appearing lesions throughout the right lobe of the liver, most likely to reflect metastatic lesions. This is associated with some portacaval lymphadenopathy. 2. Previously noted intrahepatic biliary ductal dilatation has resolved following placement of common bile duct stent and sphincterotomy. 3. Cholelithiasis and biliary sludge in the gallbladder. No findings to suggest an acute cholecystitis at this time. 4. Trace volume of ascites. Electronically Signed   By: Vinnie Langton M.D.   On: 02/06/2019 06:42   Dg C-arm 1-60 Min-no Report  Result Date:  02/01/2019 Fluoroscopy was utilized by the requesting physician.  No radiographic interpretation.   US Abdomen Limited Ruq  Result Date: 02/01/2019 CLINICAL DATA:  Episodic right upper quadrant pain EXAM: ULTRASOUND ABDOMEN LIMITED RIGHT UPPER QUADRANT COMPARISON:  None. FINDINGS: Gallbladder: Gallbladder sludge and multiple shadowing stones, including a stone at the neck. Gallbladder wall is mildly thickened to 5 mm. No pericholecystic edema. Negative sonographic Murphy sign. Common bile duct: Diameter: 8 mm. Where visualized, no filling defect. There is intrahepatic bile duct dilatation Liver: There are 3 solid appearing liver masses seen in the right liver measuring up to 5.8 cm. Portal vein is patent on color Doppler imaging with normal direction of blood flow towards the liver. IMPRESSION: 1. At least 3 liver masses primarily concerning for metastatic disease. Recommend enhanced abdomen and pelvis CT. 2.  Cholelithiasis and sludge including stone at the gallbladder neck or cystic duct. The incompletely visualized common bile duct is also dilated and there is intrahepatic biliary dilatation. There could be obstructing stone or mass, attention on follow-up CT. 3. No suspected cholecystitis. Electronically Signed   By: Monte Fantasia M.D.   On: 02/01/2019 05:48     Assessment and plan- Patient is a 68 y.o. female with newly diagnosed metastatic cholangiocarcinoma with liver metastases and peritoneal metastases  I have reviewed CT chest abdomen and pelvis images as well as MRI images independently and discussed findings with the patient.  CT abdomen showed evidence of liver and peritoneal metastases.  There was some concern that these liver lesions represented metastases versus abscess however MRI was more suggestive of liver metastases rather than abscess.  Patient did have brushings from the main bile duct which are positive for adenocarcinoma.  However the sample is limited and the source of malignancy  is not clear from the pathology specimen.  However given the absence of primary pancreatic mass, abnormal appearing common bile duct this is clinically consistent with extrahepatic cholangiocarcinoma.  Elevated CA-19-9 also supports the diagnosis.  I discussed with the patient and her daughter that we could still consider a liver biopsy more so to obtain tissue for NGS testing.  Alternative would be to proceed with treatment of extrahepatic cholangiocarcinoma and reserve biopsy later at progression.  They would not like to pursue a liver biopsy at this time.  Discussed that metastatic extrahepatic cholangiocarcinoma represents stage IV disease and carries a poor prognosis.  Chemotherapy would only be palliative and not curative and would add months but not years to her life expectancy.  I would recommend combination chemotherapy with gemcitabine at 800 mg/m along with cisplatin at 25 mg/m day 1 and day 8 every 21 days until progression or toxicity.  Discussed risks and benefits of chemotherapy including all but not limited to nausea, vomiting, fatigue, risk of low blood counts infections and hospitalization.  Risk of hearing loss, kidney dysfunction and peripheral neuropathy associated with cisplatin.  And her daughter also wanted to know what her prognosis would be in the absence of chemotherapy and I did mention that it would be less than 6 months and if they were to decide not to pursue chemotherapy hospice would be a reasonable alternative.  Patient and her daughter went home and discussed about pursuing active treatment versus hospice and patient wishes to proceed with chemotherapy at this time.  I will arrange for port placement and chemotherapy teach and tentatively see her back in 1 week's time to start first cycle of gemcitabine and cisplatin  Cancer Staging Cholangiocarcinoma metastatic to liver [facility]) Staging form: Distal Bile Duct, AJCC 8th Edition - Clinical stage from 02/08/2019: Stage IV  (cTX, cN1, cM1) - Signed by Sindy Guadeloupe, MD on 02/12/2019    visit Diagnosis 1. Cholangiocarcinoma metastatic to liver ([place])   2. Peritoneal carcinomatosis ([place])   3. Goals of care, counseling/discussion      Dr. Randa Evens, MD, MPH [facility] at [facility] [phone] 02/12/2019 10:03 AM

## 2019-02-12 NOTE — Telephone Encounter (Signed)
Called and spoke to daughter about the appointments for her mother.  First appointment is a chemotherapy teaching class on February 19.  It starts at 9 AM here at the cancer center and will last till 1130.  Explained to daughter that she can bring other people that will be participating in the patient's cares to listen to all the instructions that they go over.  Also told daughter if she thinks the patient would not be able to last for that amount of time she does not have to come to the appointment if she did not feel like patient could stay that long.  The Port-A-Cath placement is going to be on February 20 arrival [facility] the medical mall at 9 AM at the admitting and registration desk.  Told daughter that 24 hours before the date of Port-A-Cath she cannot take her Metformin.  Daughter states she takes it twice a day so she will not give it to her on the 2/19.  Also told her daughter that she has to be n.p.o. for 6 hours prior to the procedure.  She will be there for an average of 2 to 4 hours before she would be released to go back home.  She will start her chemotherapy on February 27 815 to have labs through her port then see the doctor at 845 then start her chemo at 915.  Explained to the daughter because this is a long treatment that is the first day that they have a chair block for that amount of time for her.  Daughter agreeable to plan and wrote all the information down and repeated it back to me and it was correct

## 2019-02-13 NOTE — Patient Instructions (Signed)
Cisplatin injection  What is this medicine?  CISPLATIN (SIS pla tin) is a chemotherapy drug. It targets fast dividing cells, like cancer cells, and causes these cells to die. This medicine is used to treat many types of cancer like bladder, ovarian, and testicular cancers.  This medicine may be used for other purposes; ask your health care provider or pharmacist if you have questions.  COMMON BRAND NAME(S): Platinol, Platinol -AQ  What should I tell my health care provider before I take this medicine?  They need to know if you have any of these conditions:  -blood disorders  -hearing problems  -kidney disease  -recent or ongoing radiation therapy  -an unusual or allergic reaction to cisplatin, carboplatin, other chemotherapy, other medicines, foods, dyes, or preservatives  -pregnant or trying to get pregnant  -breast-feeding  How should I use this medicine?  This drug is given as an infusion into a vein. It is administered in a hospital or clinic by a specially trained health care professional.  Talk to your pediatrician regarding the use of this medicine in children. Special care may be needed.  Overdosage: If you think you have taken too much of this medicine contact a poison control center or emergency room at once.  NOTE: This medicine is only for you. Do not share this medicine with others.  What if I miss a dose?  It is important not to miss a dose. Call your doctor or health care professional if you are unable to keep an appointment.  What may interact with this medicine?  -dofetilide  -foscarnet  -medicines for seizures  -medicines to increase blood counts like filgrastim, pegfilgrastim, sargramostim  -probenecid  -pyridoxine used with altretamine  -rituximab  -some antibiotics like amikacin, gentamicin, neomycin, polymyxin B, streptomycin, tobramycin  -sulfinpyrazone  -vaccines  -zalcitabine  Talk to your doctor or health care professional before taking any of these  medicines:  -acetaminophen  -aspirin  -ibuprofen  -ketoprofen  -naproxen  This list may not describe all possible interactions. Give your health care provider a list of all the medicines, herbs, non-prescription drugs, or dietary supplements you use. Also tell them if you smoke, drink alcohol, or use illegal drugs. Some items may interact with your medicine.  What should I watch for while using this medicine?  Your condition will be monitored carefully while you are receiving this medicine. You will need important blood work done while you are taking this medicine.  This drug may make you feel generally unwell. This is not uncommon, as chemotherapy can affect healthy cells as well as cancer cells. Report any side effects. Continue your course of treatment even though you feel ill unless your doctor tells you to stop.  In some cases, you may be given additional medicines to help with side effects. Follow all directions for their use.  Call your doctor or health care professional for advice if you get a fever, chills or sore throat, or other symptoms of a cold or flu. Do not treat yourself. This drug decreases your body's ability to fight infections. Try to avoid being around people who are sick.  This medicine may increase your risk to bruise or bleed. Call your doctor or health care professional if you notice any unusual bleeding.  Be careful brushing and flossing your teeth or using a toothpick because you may get an infection or bleed more easily. If you have any dental work done, tell your dentist you are receiving this medicine.  Avoid taking products   that contain aspirin, acetaminophen, ibuprofen, naproxen, or ketoprofen unless instructed by your doctor. These medicines may hide a fever.  Do not become pregnant while taking this medicine. Women should inform their doctor if they wish to become pregnant or think they might be pregnant. There is a potential for serious side effects to an unborn child. Talk to  your health care professional or pharmacist for more information. Do not breast-feed an infant while taking this medicine.  Drink fluids as directed while you are taking this medicine. This will help protect your kidneys.  Call your doctor or health care professional if you get diarrhea. Do not treat yourself.  What side effects may I notice from receiving this medicine?  Side effects that you should report to your doctor or health care professional as soon as possible:  -allergic reactions like skin rash, itching or hives, swelling of the face, lips, or tongue  -signs of infection - fever or chills, cough, sore throat, pain or difficulty passing urine  -signs of decreased platelets or bleeding - bruising, pinpoint red spots on the skin, black, tarry stools, nosebleeds  -signs of decreased red blood cells - unusually weak or tired, fainting spells, lightheadedness  -breathing problems  -changes in hearing  -gout pain  -low blood counts - This drug may decrease the number of white blood cells, red blood cells and platelets. You may be at increased risk for infections and bleeding.  -nausea and vomiting  -pain, swelling, redness or irritation at the injection site  -pain, tingling, numbness in the hands or feet  -problems with balance, movement  -trouble passing urine or change in the amount of urine  Side effects that usually do not require medical attention (report to your doctor or health care professional if they continue or are bothersome):  -changes in vision  -loss of appetite  -metallic taste in the mouth or changes in taste  This list may not describe all possible side effects. Call your doctor for medical advice about side effects. You may report side effects to FDA at 1-800-FDA-1088.  Where should I keep my medicine?  This drug is given in a hospital or clinic and will not be stored at home.  NOTE: This sheet is a summary. It may not cover all possible information. If you have questions about this medicine,  talk to your doctor, pharmacist, or health care provider.   2019 Elsevier/Gold Standard (2008-03-19 14:40:54)    Gemcitabine injection  What is this medicine?  GEMCITABINE (jem SYE ta been) is a chemotherapy drug. This medicine is used to treat many types of cancer like breast cancer, lung cancer, pancreatic cancer, and ovarian cancer.  This medicine may be used for other purposes; ask your health care provider or pharmacist if you have questions.  COMMON BRAND NAME(S): Gemzar, Infugem  What should I tell my health care provider before I take this medicine?  They need to know if you have any of these conditions:  -blood disorders  -infection  -kidney disease  -liver disease  -lung or breathing disease, like asthma  -recent or ongoing radiation therapy  -an unusual or allergic reaction to gemcitabine, other chemotherapy, other medicines, foods, dyes, or preservatives  -pregnant or trying to get pregnant  -breast-feeding  How should I use this medicine?  This drug is given as an infusion into a vein. It is administered in a hospital or clinic by a specially trained health care professional.  Talk to your pediatrician regarding the use of   this medicine in children. Special care may be needed.  Overdosage: If you think you have taken too much of this medicine contact a poison control center or emergency room at once.  NOTE: This medicine is only for you. Do not share this medicine with others.  What if I miss a dose?  It is important not to miss your dose. Call your doctor or health care professional if you are unable to keep an appointment.  What may interact with this medicine?  -medicines to increase blood counts like filgrastim, pegfilgrastim, sargramostim  -some other chemotherapy drugs like cisplatin  -vaccines  Talk to your doctor or health care professional before taking any of these medicines:  -acetaminophen  -aspirin  -ibuprofen  -ketoprofen  -naproxen  This list may not describe all possible interactions.  Give your health care provider a list of all the medicines, herbs, non-prescription drugs, or dietary supplements you use. Also tell them if you smoke, drink alcohol, or use illegal drugs. Some items may interact with your medicine.  What should I watch for while using this medicine?  Visit your doctor for checks on your progress. This drug may make you feel generally unwell. This is not uncommon, as chemotherapy can affect healthy cells as well as cancer cells. Report any side effects. Continue your course of treatment even though you feel ill unless your doctor tells you to stop.  In some cases, you may be given additional medicines to help with side effects. Follow all directions for their use.  Call your doctor or health care professional for advice if you get a fever, chills or sore throat, or other symptoms of a cold or flu. Do not treat yourself. This drug decreases your body's ability to fight infections. Try to avoid being around people who are sick.  This medicine may increase your risk to bruise or bleed. Call your doctor or health care professional if you notice any unusual bleeding.  Be careful brushing and flossing your teeth or using a toothpick because you may get an infection or bleed more easily. If you have any dental work done, tell your dentist you are receiving this medicine.  Avoid taking products that contain aspirin, acetaminophen, ibuprofen, naproxen, or ketoprofen unless instructed by your doctor. These medicines may hide a fever.  Do not become pregnant while taking this medicine or for 6 months after stopping it. Women should inform their doctor if they wish to become pregnant or think they might be pregnant. Men should not father a child while taking this medicine and for 3 months after stopping it. There is a potential for serious side effects to an unborn child. Talk to your health care professional or pharmacist for more information. Do not breast-feed an infant while taking this  medicine or for at least 1 week after stopping it.  Men should inform their doctors if they wish to father a child. This medicine may lower sperm counts. Talk with your doctor or health care professional if you are concerned about your fertility.  What side effects may I notice from receiving this medicine?  Side effects that you should report to your doctor or health care professional as soon as possible:  -allergic reactions like skin rash, itching or hives, swelling of the face, lips, or tongue  -breathing problems  -pain, redness, or irritation at site where injected  -signs and symptoms of a dangerous change in heartbeat or heart rhythm like chest pain; dizziness; fast or irregular heartbeat; palpitations; feeling   faint or lightheaded, falls; breathing problems  -signs of decreased platelets or bleeding - bruising, pinpoint red spots on the skin, black, tarry stools, blood in the urine  -signs of decreased red blood cells - unusually weak or tired, feeling faint or lightheaded, falls  -signs of infection - fever or chills, cough, sore throat, pain or difficulty passing urine  -signs and symptoms of kidney injury like trouble passing urine or change in the amount of urine  -signs and symptoms of liver injury like dark yellow or brown urine; general ill feeling or flu-like symptoms; light-colored stools; loss of appetite; nausea; right upper belly pain; unusually weak or tired; yellowing of the eyes or skin  -swelling of ankles, feet, hands  Side effects that usually do not require medical attention (report to your doctor or health care professional if they continue or are bothersome):  -constipation  -diarrhea  -hair loss  -loss of appetite  -nausea  -rash  -vomiting  This list may not describe all possible side effects. Call your doctor for medical advice about side effects. You may report side effects to FDA at 1-800-FDA-1088.  Where should I keep my medicine?  This drug is given in a hospital or clinic and  will not be stored at home.  NOTE: This sheet is a summary. It may not cover all possible information. If you have questions about this medicine, talk to your doctor, pharmacist, or health care provider.   2019 Elsevier/Gold Standard (2018-03-08 18:06:11)

## 2019-02-14 ENCOUNTER — Ambulatory Visit: Payer: Medicare HMO

## 2019-02-14 ENCOUNTER — Inpatient Hospital Stay: Payer: Medicare HMO

## 2019-02-14 MED ORDER — CEFAZOLIN SODIUM-DEXTROSE 2-4 GM/100ML-% IV SOLN: 2.0000 g | Freq: Once | INTRAVENOUS | Status: DC

## 2019-02-15 ENCOUNTER — Telehealth: Payer: Self-pay | Admitting: *Deleted

## 2019-02-15 ENCOUNTER — Inpatient Hospital Stay: Payer: Medicare HMO | Admitting: Oncology

## 2019-02-15 ENCOUNTER — Encounter
Admission: RE | Disposition: A | Discharge status: Home / Self Care | Payer: Self-pay | Source: Home / Self Care | Attending: Vascular Surgery

## 2019-02-15 ENCOUNTER — Other Ambulatory Visit: Payer: Self-pay

## 2019-02-15 ENCOUNTER — Ambulatory Visit
Admission: RE | Admit: 2019-02-15 | Discharge: 2019-02-15 | Disposition: A | Discharge status: Home / Self Care | Payer: Medicare HMO | Attending: Vascular Surgery | Admitting: Vascular Surgery

## 2019-02-15 DIAGNOSIS — C787 Secondary malignant neoplasm of liver and intrahepatic bile duct: Secondary | ICD-10-CM | POA: Diagnosis present

## 2019-02-15 DIAGNOSIS — Z8249 Family history of ischemic heart disease and other diseases of the circulatory system: Secondary | ICD-10-CM | POA: Insufficient documentation

## 2019-02-15 DIAGNOSIS — Z7984 Long term (current) use of oral hypoglycemic drugs: Secondary | ICD-10-CM | POA: Diagnosis not present

## 2019-02-15 DIAGNOSIS — C801 Malignant (primary) neoplasm, unspecified: Secondary | ICD-10-CM | POA: Diagnosis not present

## 2019-02-15 DIAGNOSIS — G4700 Insomnia, unspecified: Secondary | ICD-10-CM | POA: Diagnosis not present

## 2019-02-15 DIAGNOSIS — Z833 Family history of diabetes mellitus: Secondary | ICD-10-CM | POA: Diagnosis not present

## 2019-02-15 DIAGNOSIS — G2581 Restless legs syndrome: Secondary | ICD-10-CM | POA: Insufficient documentation

## 2019-02-15 DIAGNOSIS — C228 Malignant neoplasm of liver, primary, unspecified as to type: Secondary | ICD-10-CM

## 2019-02-15 DIAGNOSIS — I8390 Asymptomatic varicose veins of unspecified lower extremity: Secondary | ICD-10-CM | POA: Diagnosis not present

## 2019-02-15 DIAGNOSIS — Z823 Family history of stroke: Secondary | ICD-10-CM | POA: Diagnosis not present

## 2019-02-15 DIAGNOSIS — Z79890 Hormone replacement therapy (postmenopausal): Secondary | ICD-10-CM | POA: Insufficient documentation

## 2019-02-15 DIAGNOSIS — C799 Secondary malignant neoplasm of unspecified site: Secondary | ICD-10-CM

## 2019-02-15 DIAGNOSIS — C229 Malignant neoplasm of liver, not specified as primary or secondary: Secondary | ICD-10-CM

## 2019-02-15 DIAGNOSIS — E782 Mixed hyperlipidemia: Secondary | ICD-10-CM | POA: Insufficient documentation

## 2019-02-15 DIAGNOSIS — Z79899 Other long term (current) drug therapy: Secondary | ICD-10-CM | POA: Insufficient documentation

## 2019-02-15 DIAGNOSIS — Z809 Family history of malignant neoplasm, unspecified: Secondary | ICD-10-CM | POA: Insufficient documentation

## 2019-02-15 DIAGNOSIS — C786 Secondary malignant neoplasm of retroperitoneum and peritoneum: Secondary | ICD-10-CM | POA: Insufficient documentation

## 2019-02-15 DIAGNOSIS — Z7982 Long term (current) use of aspirin: Secondary | ICD-10-CM | POA: Diagnosis not present

## 2019-02-15 DIAGNOSIS — E119 Type 2 diabetes mellitus without complications: Secondary | ICD-10-CM | POA: Diagnosis not present

## 2019-02-15 HISTORY — PX: PORTA CATH INSERTION: CATH118285

## 2019-02-15 LAB — GLUCOSE, CAPILLARY
Glucose-Capillary: 149 mg/dL — ABNORMAL HIGH (ref 70–99)
Glucose-Capillary: 149 mg/dL — ABNORMAL HIGH (ref 70–99)

## 2019-02-15 SURGERY — PORTA CATH INSERTION
Anesthesia: Moderate Sedation

## 2019-02-15 MED ORDER — SODIUM CHLORIDE 0.9 % IV SOLN
INTRAVENOUS | Status: DC
  Administered 2019-02-15: 10:00:00 via INTRAVENOUS

## 2019-02-15 MED ORDER — DIPHENHYDRAMINE HCL 50 MG/ML IJ SOLN: 50.0000 mg | Freq: Once | INTRAMUSCULAR | Status: DC

## 2019-02-15 MED ORDER — MIDAZOLAM HCL 2 MG/ML PO SYRP: 8.0000 mg | ORAL_SOLUTION | Freq: Once | ORAL | Status: DC | PRN

## 2019-02-15 MED ORDER — HYDROMORPHONE HCL 1 MG/ML IJ SOLN: 1.0000 mg | Freq: Once | INTRAMUSCULAR | Status: DC | PRN

## 2019-02-15 MED ORDER — MIDAZOLAM HCL 5 MG/5ML IJ SOLN
INTRAMUSCULAR | Status: AC
  Filled 2019-02-15: qty 5

## 2019-02-15 MED ORDER — FAMOTIDINE 20 MG PO TABS: 40.0000 mg | ORAL_TABLET | ORAL | Status: DC | PRN

## 2019-02-15 MED ORDER — FENTANYL CITRATE (PF) 100 MCG/2ML IJ SOLN
INTRAMUSCULAR | Status: AC
  Filled 2019-02-15: qty 2

## 2019-02-15 MED ORDER — HEPARIN (PORCINE) IN NACL 1000-0.9 UT/500ML-% IV SOLN
INTRAVENOUS | Status: AC
  Filled 2019-02-15: qty 500

## 2019-02-15 MED ORDER — LIDOCAINE-EPINEPHRINE (PF) 1 %-1:200000 IJ SOLN
INTRAMUSCULAR | Status: AC
  Filled 2019-02-15: qty 30

## 2019-02-15 MED ORDER — ONDANSETRON HCL 4 MG/2ML IJ SOLN: 4.0000 mg | Freq: Four times a day (QID) | INTRAMUSCULAR | Status: DC | PRN

## 2019-02-15 MED ORDER — SODIUM CHLORIDE 0.9 % IV SOLN
Freq: Once | INTRAVENOUS | Status: AC
  Administered 2019-02-15: 09:00:00
  Filled 2019-02-15: qty 80

## 2019-02-15 MED ORDER — METHYLPREDNISOLONE SODIUM SUCC 125 MG IJ SOLR: 125.0000 mg | INTRAMUSCULAR | Status: DC | PRN

## 2019-02-15 SURGICAL SUPPLY — 10 items
DERMABOND ADVANCED (GAUZE/BANDAGES/DRESSINGS) ×2
DERMABOND ADVANCED .7 DNX12 (GAUZE/BANDAGES/DRESSINGS) ×1 IMPLANT
KIT PORT POWER 8FR ISP CVUE (Port) ×3 IMPLANT
PACK ANGIOGRAPHY (CUSTOM PROCEDURE TRAY) ×3 IMPLANT
PAD GROUND ADULT SPLIT (MISCELLANEOUS) ×3 IMPLANT
PENCIL ELECTRO HAND CTR (MISCELLANEOUS) ×3 IMPLANT
SUT MNCRL AB 4-0 PS2 18 (SUTURE) ×3 IMPLANT
SUT PROLENE 0 CT 1 30 (SUTURE) ×3 IMPLANT
SUT VICRYL+ 3-0 36IN CT-1 (SUTURE) ×3 IMPLANT
TOWEL OR 17X26 4PK STRL BLUE (TOWEL DISPOSABLE) ×3 IMPLANT

## 2019-02-15 NOTE — Op Note (Signed)
      [facility]       Operative Note  Date: 02/15/2019  Preoperative diagnosis:  1. Metastatic liver cancer  Postoperative diagnosis:  Same as above  Procedures: #1. Ultrasound guidance for vascular access to the right internal jugular vein. #2. Fluoroscopic guidance for placement of catheter. #3. Placement of CT compatible Port-A-Cath, right internal jugular vein.  Surgeon: Leotis Pain, MD.   Anesthesia: Local with moderate conscious sedation for approximately 20 minutes using 2 mg of Versed and 50 mcg of Fentanyl  Fluoroscopy time: less than 1 minute  Contrast used: 0  Estimated blood loss: 5 cc  Indication for the procedure:  The patient is a 68 y.o.female with metastatic liver cancer.  The patient needs a Port-A-Cath for durable venous access, chemotherapy, lab draws, and CT scans. We are asked to place this. Risks and benefits were discussed and informed consent was obtained.  Description of procedure: The patient was brought to the vascular and interventional radiology suite.  Moderate conscious sedation was administered throughout the procedure during a face to face encounter with the patient with my supervision of the RN administering medicines and monitoring the patient's vital signs, pulse oximetry, telemetry and mental status throughout from the start of the procedure until the patient was taken to the recovery room. The right neck chest and shoulder were sterilely prepped and draped, and a sterile surgical field was created. Ultrasound was used to help visualize a patent right internal jugular vein. This was then accessed under direct ultrasound guidance without difficulty with the Seldinger needle and a permanent image was recorded. A J-wire was placed. After skin nick and dilatation, the peel-away sheath was then placed over the wire. I then anesthetized an area under the clavicle approximately 1-2 fingerbreadths. A transverse incision was created and an  inferior pocket was created with electrocautery and blunt dissection. The port was then brought onto the field, placed into the pocket and secured to the chest wall with 2 Prolene sutures. The catheter was connected to the port and tunneled from the subclavicular incision to the access site. Fluoroscopic guidance was then used to cut the catheter to an appropriate length. The catheter was then placed through the peel-away sheath and the peel-away sheath was removed. The catheter tip was parked in excellent location under fluorocoscopic guidance in the cavoatrial junction. The pocket was then irrigated with antibiotic impregnated saline and the wound was closed with a running 3-0 Vicryl and a 4-0 Monocryl. The access incision was closed with a single 4-0 Monocryl. The Huber needle was used to withdraw blood and flush the port with heparinized saline. Dermabond was then placed as a dressing. The patient tolerated the procedure well and was taken to the recovery room in stable condition.   Leotis Pain 02/15/2019 11:34 AM   This note was created with Dragon Medical transcription system. Any errors in dictation are purely unintentional.

## 2019-02-15 NOTE — Telephone Encounter (Signed)
Where is the pain? Abdominal pain or port site pain? If port site pain- Tylenol or Motrin will help. If it is abdominal pain- we can give her oxycodone 5 mg q6 prn

## 2019-02-15 NOTE — Telephone Encounter (Signed)
I spoke to daughter today and she states that she was hurting in her right abdomen. She states the patient said that she had to turn a lot to get port placed and she gave her tylenol 1 1/2 hours ago and she said it did not help much and wanted to know if she could give her oxycodone that they arleady had. I told her yes. But if tylenol can take care of it I would just use that. With the mets try not to give it unless she needs it and if it gets worse to call me, her daughter was agreeable

## 2019-02-15 NOTE — Telephone Encounter (Signed)
Daughter reports that pain is in abdominal and that she has already spoken with SHerry about it

## 2019-02-15 NOTE — Telephone Encounter (Signed)
Daughter called reporting that patient pain is worse. She also had port placed today. Please advise

## 2019-02-15 NOTE — H&P (Signed)
[facility] History & Physical Update  The patient was interviewed and re-examined.  The patient's previous History and Physical has been reviewed and is unchanged.  There is no change in the plan of care. We plan to proceed with the scheduled procedure.  Leotis Pain, MD  02/15/2019, 9:12 AM

## 2019-02-16 ENCOUNTER — Inpatient Hospital Stay: Payer: Medicare HMO | Admitting: Oncology

## 2019-02-19 ENCOUNTER — Other Ambulatory Visit: Payer: Self-pay

## 2019-02-19 ENCOUNTER — Emergency Department: Payer: Medicare HMO

## 2019-02-19 ENCOUNTER — Inpatient Hospital Stay
Admission: EM | Admit: 2019-02-19 | Discharge: 2019-02-23 | DRG: 194 | Disposition: A | Discharge status: Skilled Nursing Facility | Payer: Medicare HMO | Attending: Specialist | Admitting: Specialist

## 2019-02-19 DIAGNOSIS — A419 Sepsis, unspecified organism: Secondary | ICD-10-CM

## 2019-02-19 DIAGNOSIS — F419 Anxiety disorder, unspecified: Secondary | ICD-10-CM | POA: Diagnosis present

## 2019-02-19 DIAGNOSIS — J189 Pneumonia, unspecified organism: Principal | ICD-10-CM

## 2019-02-19 DIAGNOSIS — Z515 Encounter for palliative care: Secondary | ICD-10-CM

## 2019-02-19 DIAGNOSIS — Z8509 Personal history of malignant neoplasm of other digestive organs: Secondary | ICD-10-CM

## 2019-02-19 DIAGNOSIS — C787 Secondary malignant neoplasm of liver and intrahepatic bile duct: Secondary | ICD-10-CM | POA: Diagnosis present

## 2019-02-19 DIAGNOSIS — D630 Anemia in neoplastic disease: Secondary | ICD-10-CM | POA: Diagnosis present

## 2019-02-19 DIAGNOSIS — Z79890 Hormone replacement therapy (postmenopausal): Secondary | ICD-10-CM

## 2019-02-19 DIAGNOSIS — G2581 Restless legs syndrome: Secondary | ICD-10-CM | POA: Diagnosis present

## 2019-02-19 DIAGNOSIS — R140 Abdominal distension (gaseous): Secondary | ICD-10-CM

## 2019-02-19 DIAGNOSIS — Z79899 Other long term (current) drug therapy: Secondary | ICD-10-CM

## 2019-02-19 DIAGNOSIS — E782 Mixed hyperlipidemia: Secondary | ICD-10-CM | POA: Diagnosis present

## 2019-02-19 DIAGNOSIS — Z9221 Personal history of antineoplastic chemotherapy: Secondary | ICD-10-CM

## 2019-02-19 DIAGNOSIS — K219 Gastro-esophageal reflux disease without esophagitis: Secondary | ICD-10-CM | POA: Diagnosis present

## 2019-02-19 DIAGNOSIS — R0902 Hypoxemia: Secondary | ICD-10-CM

## 2019-02-19 DIAGNOSIS — Z7982 Long term (current) use of aspirin: Secondary | ICD-10-CM

## 2019-02-19 DIAGNOSIS — R0602 Shortness of breath: Secondary | ICD-10-CM | POA: Diagnosis present

## 2019-02-19 DIAGNOSIS — E1142 Type 2 diabetes mellitus with diabetic polyneuropathy: Secondary | ICD-10-CM

## 2019-02-19 DIAGNOSIS — Z66 Do not resuscitate: Secondary | ICD-10-CM | POA: Diagnosis present

## 2019-02-19 DIAGNOSIS — E039 Hypothyroidism, unspecified: Secondary | ICD-10-CM | POA: Diagnosis present

## 2019-02-19 DIAGNOSIS — K8020 Calculus of gallbladder without cholecystitis without obstruction: Secondary | ICD-10-CM | POA: Diagnosis present

## 2019-02-19 DIAGNOSIS — E1140 Type 2 diabetes mellitus with diabetic neuropathy, unspecified: Secondary | ICD-10-CM | POA: Diagnosis present

## 2019-02-19 DIAGNOSIS — R1011 Right upper quadrant pain: Secondary | ICD-10-CM

## 2019-02-19 DIAGNOSIS — F339 Major depressive disorder, recurrent, unspecified: Secondary | ICD-10-CM | POA: Diagnosis present

## 2019-02-19 DIAGNOSIS — C786 Secondary malignant neoplasm of retroperitoneum and peritoneum: Secondary | ICD-10-CM | POA: Diagnosis present

## 2019-02-19 DIAGNOSIS — R188 Other ascites: Secondary | ICD-10-CM

## 2019-02-19 DIAGNOSIS — G40909 Epilepsy, unspecified, not intractable, without status epilepticus: Secondary | ICD-10-CM | POA: Diagnosis present

## 2019-02-19 DIAGNOSIS — Z7984 Long term (current) use of oral hypoglycemic drugs: Secondary | ICD-10-CM

## 2019-02-19 DIAGNOSIS — I10 Essential (primary) hypertension: Secondary | ICD-10-CM | POA: Diagnosis present

## 2019-02-19 DIAGNOSIS — Z833 Family history of diabetes mellitus: Secondary | ICD-10-CM

## 2019-02-19 LAB — BLOOD GAS, VENOUS
Acid-Base Excess: 9.4 mmol/L — ABNORMAL HIGH (ref 0.0–2.0)
Bicarbonate: 34.3 mmol/L — ABNORMAL HIGH (ref 20.0–28.0)
O2 SAT: 87.2 %
Patient temperature: 37
pCO2, Ven: 46 mmHg (ref 44.0–60.0)
pH, Ven: 7.48 — ABNORMAL HIGH (ref 7.250–7.430)
pO2, Ven: 49 mmHg — ABNORMAL HIGH (ref 32.0–45.0)

## 2019-02-19 LAB — COMPREHENSIVE METABOLIC PANEL
ALBUMIN: 2.1 g/dL — AB (ref 3.5–5.0)
ALT: 15 U/L (ref 0–44)
ANION GAP: 9 (ref 5–15)
AST: 25 U/L (ref 15–41)
Alkaline Phosphatase: 264 U/L — ABNORMAL HIGH (ref 38–126)
BUN: 12 mg/dL (ref 8–23)
CO2: 30 mmol/L (ref 22–32)
Calcium: 8.2 mg/dL — ABNORMAL LOW (ref 8.9–10.3)
Chloride: 97 mmol/L — ABNORMAL LOW (ref 98–111)
Creatinine, Ser: 0.59 mg/dL (ref 0.44–1.00)
GFR calc Af Amer: >60 mL/min (ref >60)
GFR calc non Af Amer: >60 mL/min (ref >60)
GLUCOSE: 164 mg/dL — AB (ref 70–99)
Potassium: 4.1 mmol/L (ref 3.5–5.1)
Sodium: 136 mmol/L (ref 135–145)
Total Bilirubin: 0.8 mg/dL (ref 0.3–1.2)
Total Protein: 6.1 g/dL — ABNORMAL LOW (ref 6.5–8.1)

## 2019-02-19 LAB — CBC WITH DIFFERENTIAL/PLATELET
Abs Immature Granulocytes: 0.11 10*3/uL — ABNORMAL HIGH (ref 0.00–0.07)
BASOS ABS: 0.1 10*3/uL (ref 0.0–0.1)
Basophils Relative: 1 %
Eosinophils Absolute: 0 10*3/uL (ref 0.0–0.5)
Eosinophils Relative: 0 %
HEMATOCRIT: 35.5 % — AB (ref 36.0–46.0)
Hemoglobin: 10.9 g/dL — ABNORMAL LOW (ref 12.0–15.0)
Immature Granulocytes: 1 %
Lymphocytes Relative: 10 %
Lymphs Abs: 0.8 10*3/uL (ref 0.7–4.0)
MCH: 26.3 pg (ref 26.0–34.0)
MCHC: 30.7 g/dL (ref 30.0–36.0)
MCV: 85.5 fL (ref 80.0–100.0)
Monocytes Absolute: 1.2 10*3/uL — ABNORMAL HIGH (ref 0.1–1.0)
Monocytes Relative: 15 %
Neutro Abs: 5.9 10*3/uL (ref 1.7–7.7)
Neutrophils Relative %: 73 %
Platelets: 609 10*3/uL — ABNORMAL HIGH (ref 150–400)
RBC: 4.15 MIL/uL (ref 3.87–5.11)
RDW: 16.3 % — AB (ref 11.5–15.5)
WBC: 8 10*3/uL (ref 4.0–10.5)
nRBC: 0 % (ref 0.0–0.2)

## 2019-02-19 LAB — LIPASE, BLOOD: Lipase: 30 U/L (ref 11–51)

## 2019-02-19 LAB — LACTIC ACID, PLASMA: Lactic Acid, Venous: 0.9 mmol/L (ref 0.5–1.9)

## 2019-02-19 LAB — PROTIME-INR
INR: 1.4
Prothrombin Time: 16.9 seconds — ABNORMAL HIGH (ref 11.4–15.2)

## 2019-02-19 MED ORDER — SODIUM CHLORIDE 0.9 % IV BOLUS: 1000.0000 mL | Freq: Once | INTRAVENOUS | Status: DC

## 2019-02-19 MED ORDER — SODIUM CHLORIDE 0.9% FLUSH: 3.0000 mL | Freq: Once | INTRAVENOUS | Status: DC

## 2019-02-19 NOTE — ED Triage Notes (Signed)
Pt to ED via EMS from home, pt c/o vomiting new today denies any abd pain or pain anywhere. Pt states she vomited 3 times, last time around 2220. Pt was recently diagnosed with cancer and had port placement last week, has first chemo Thursday. NAD.

## 2019-02-19 NOTE — ED Provider Notes (Signed)
[facility] Emergency Department Provider Note   ____________________________________________   First MD Initiated Contact with Patient 02/19/19 2305     (approximate)  I have reviewed the triage vital signs and the nursing notes.   HISTORY  Chief Complaint Weakness and Emesis   HPI Kaylee Wolf is a 68 y.o. 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  She has a history of seizures.  She has gallstones.  She reports she was in her usual state of fairly good health actually when she ate dinner and took her medicines and was going to bed and she began vomiting.  She vomited several times felt very weak and could not get up.  She says she has been feeling weaker lately anyway.  In the emergency room she is tachycardic with a rate of 120 O2 sats 8889 on room air.  She is not on oxygen at home.  She denies any fever shortness of breath any unusual pain.  She says usually has pain in the right side anterior axillary line but that is been there for some time.  Past Medical History:  Diagnosis Date  . Anxiety   . Colon polyp 11/29/2012   at [place].  . Insomnia   . Mixed hyperlipidemia   . Recurrent major depressive disorder ([place])   . RLS (restless legs syndrome)   . Seizures ([place])   . Tinea pedis of both feet   . Type 2 diabetes mellitus ([place]) 2014?  [name] Varicose veins     Patient Active Problem List   Diagnosis Date Noted  . Goals of care, counseling/discussion 02/12/2019  . Peritoneal carcinomatosis ([place]) 02/12/2019  . 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 Pressure injury of skin 02/02/2019  . Cholangiocarcinoma ([place]) 02/01/2019  . Gall stones 04/18/2018  . Syncope 02/03/2018  . Epilepsy ([place]) 02/02/2018  . Pain in both upper extremities 09/06/2017  . Anxiety 07/03/2017  . Onychomycosis of left great toe 02/07/2017  . Allergic rhinitis due to allergen  02/07/2017  . Arthralgia of right temporomandibular joint 02/07/2017  . 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 Recurrent major depressive disorder ([place]) 10/24/2015  . Varicose veins 10/24/2015  . Seizures ([place]) 10/24/2015  . Tinea pedis of both feet 10/24/2015  . Insomnia 10/24/2015  . RLS (restless legs syndrome) 10/24/2015  . Avitaminosis D 11/13/2012  . HLD (hyperlipidemia) 08/14/2012    Past Surgical History:  Procedure Laterality Date  . COLONOSCOPY  11/29/2012   Dr [name]  . ERCP N/A 02/01/2019   Procedure: ENDOSCOPIC RETROGRADE CHOLANGIOPANCREATOGRAPHY (ERCP);  Surgeon: [name], MD;  Location: [facility] ENDOSCOPY;  Service: Endoscopy;  Laterality: N/A;  . none    . PORTA CATH INSERTION N/A 02/15/2019   Procedure: PORTA CATH INSERTION;  Surgeon: Algernon Huxley, MD;  Location: [facility] CV LAB;  Service: Cardiovascular;  Laterality: N/A;    Prior to Admission medications   Medication Sig Start Date End Date Taking? Authorizing Provider  acetaminophen (TYLENOL) 325 MG tablet Take 2 tablets (650 mg total) by mouth every 8 (eight) hours as needed for mild pain (or Fever >/= 101). 02/06/19   Nicholes Mango, MD  aspirin EC 81 MG EC tablet Take 1 tablet (81 mg total) by mouth daily. 02/03/18   Loletha Grayer, MD  atorvastatin (LIPITOR) 20 MG tablet Take 1 tablet (20 mg  total) by mouth daily. 09/06/17   Karamalegos, Devonne Doughty, DO  blood glucose meter kit and supplies KIT Dispense based on patient and insurance preference. Check blood glucose once daily. 11/25/15   Luciana Axe, NP  Cholecalciferol (VITAMIN D-3) 1000 UNITS CAPS Take 1 capsule by mouth daily.     [provider]  dexamethasone (DECADRON) 4 MG tablet Take 2 tablets by mouth once a day on the day after chemotherapy and then take 2 tablets two times a day for 2 days. Take with food. 02/12/19   Sindy Guadeloupe, MD    divalproex (DEPAKOTE) 125 MG DR tablet Take 1 pill in am and 2 pill at night 02/21/17   Rainey Pines, MD  escitalopram (LEXAPRO) 20 MG tablet Take 1 tablet (20 mg total) by mouth daily. 09/06/17   Karamalegos, Devonne Doughty, DO  furosemide (LASIX) 40 MG tablet Take 40 mg by mouth daily.  01/17/19   [provider]  gabapentin (NEURONTIN) 400 MG capsule Take 1 capsule (400 mg total) by mouth 2 (two) times daily. 09/06/17   Karamalegos, Devonne Doughty, DO  lamoTRIgine (LAMICTAL) 150 MG tablet Take 150 mg by mouth 2 (two) times daily.  07/20/16   [provider]  levothyroxine (SYNTHROID, LEVOTHROID) 50 MCG tablet Take 50 mcg by mouth daily before breakfast.  12/28/18   [provider]  lidocaine-prilocaine (EMLA) cream Apply to affected area once 02/12/19   Sindy Guadeloupe, MD  linaclotide [facility]) 72 MCG capsule Take 72 mcg by mouth as needed.    [provider]  metFORMIN (GLUCOPHAGE) 500 MG tablet Take 1 tablet (500 mg total) by mouth 2 (two) times daily. 09/06/17   Karamalegos, Devonne Doughty, DO  Multiple Vitamin (MULTIVITAMIN) tablet Take 1 tablet by mouth daily.    [provider]  nitrofurantoin (MACRODANTIN) 100 MG capsule  01/16/19   [provider]  omeprazole (PRILOSEC) 20 MG capsule Take 1 capsule (20 mg total) by mouth daily. Before 30 min before first meal of day 09/06/17   Parks Ranger, Devonne Doughty, DO  ondansetron (ZOFRAN) 8 MG tablet Take 1 tablet (8 mg total) by mouth 2 (two) times daily as needed. Start on the third day after chemotherapy. 02/12/19   Sindy Guadeloupe, MD  ondansetron (ZOFRAN-ODT) 4 MG disintegrating tablet Take 1 tablet (4 mg total) by mouth 2 (two) times daily. 09/06/17   Karamalegos, Devonne Doughty, DO  oxyCODONE (OXY IR/ROXICODONE) 5 MG immediate release tablet Take 1 tablet (5 mg total) by mouth every 6 (six) hours as needed for moderate pain. 02/06/19   Nicholes Mango, MD  phenytoin (DILANTIN) 200 MG ER capsule Take 100 mg by mouth 2  (two) times daily.  11/17/15   [provider]  polyethylene glycol (MIRALAX / GLYCOLAX) packet Take 17 g by mouth daily as needed for mild constipation. 02/06/19   Gouru, Illene Silver, MD  potassium chloride (K-DUR,KLOR-CON) 10 MEQ tablet Take 10 mEq by mouth once.  10/27/18   [provider]  prochlorperazine (COMPAZINE) 10 MG tablet Take 1 tablet (10 mg total) by mouth every 6 (six) hours as needed (Nausea or vomiting). 02/12/19   Sindy Guadeloupe, MD  REXULTI 3 MG TABS 3 mg every morning.  01/19/19   [provider]    Allergies Patient has no known allergies.  Family History  Problem Relation Age of Onset  . Diabetes Mother   . Heart disease Mother   . Cancer Father   . Diabetes Brother   .  Hypertension Brother   . Stroke Brother   . Hypertension Brother   . Breast cancer Neg Hx     Social History Social History   Tobacco Use  . Smoking status: Never Smoker  . Smokeless tobacco: Never Used  Substance Use Topics  . Alcohol use: No    Alcohol/week: 0.0 standard drinks  . Drug use: No    Review of Systems  Constitutional: No fever/chills Eyes: No visual changes. ENT: No sore throat. Cardiovascular: Denies chest pain. Respiratory: Denies shortness of breath. Gastrointestinal: No new abdominal pain.  No nausea or vomiting at the present time.  No diarrhea.  No constipation. Genitourinary: Negative for dysuria. Musculoskeletal: Negative for back pain. Skin: Negative for rash. Neurological: Negative for headaches, focal weakness  ____________________________________________   PHYSICAL EXAM:  VITAL SIGNS: ED Triage Vitals  Enc Vitals Group     BP 02/19/19 2240 134/65     Pulse Rate 02/19/19 2240 (!) 121     Resp 02/19/19 2240 20     Temp 02/19/19 2240 98.1 F (36.7 C)     Temp Source 02/19/19 2240 Oral     SpO2 02/19/19 2240 92 %     Weight 02/19/19 2236 197 lb 12 oz (89.7 kg)     Height 02/19/19 2236 _0  (1.651 m)     Head Circumference --       Peak Flow --      Pain Score 02/19/19 2240 0     Pain Loc --      Pain Edu? --      Excl. in [place]? --     Constitutional: Alert and oriented. Well appearing and in no acute distress. Eyes: Conjunctivae are normal.  Head: Atraumatic. Nose: No congestion/rhinnorhea. Mouth/Throat: Mucous membranes are moist.  Oropharynx non-erythematous. Neck: No stridor.  Cardiovascular: Rapid rate, regular rhythm. Grossly normal heart sounds.  Good peripheral circulation. Respiratory: Normal respiratory effort.  No retractions. Lungs CTAB. Gastrointestinal: Soft and nontender. No distention. No abdominal bruits. No CVA tenderness. Musculoskeletal: No lower extremity tenderness nor edema.   Neurologic:  Normal speech and language. No gross focal neurologic deficits are appreciated Skin:  Skin is warm, dry and intact. No rash noted. Psychiatric: Mood and affect are normal. Speech and behavior are normal.  ____________________________________________   LABS (all labs ordered are listed, but only abnormal results are displayed)  Labs Reviewed  COMPREHENSIVE METABOLIC PANEL - Abnormal; Notable for the following components:      Result Value   Chloride 97 (*)    Glucose, Bld 164 (*)    Calcium 8.2 (*)    Total Protein 6.1 (*)    Albumin 2.1 (*)    Alkaline Phosphatase 264 (*)    All other components within normal limits  CBC WITH DIFFERENTIAL/PLATELET - Abnormal; Notable for the following components:   Hemoglobin 10.9 (*)    HCT 35.5 (*)    RDW 16.3 (*)    Platelets 609 (*)    Monocytes Absolute 1.2 (*)    Abs Immature Granulocytes 0.11 (*)    All other components within normal limits  PROTIME-INR - Abnormal; Notable for the following components:   Prothrombin Time 16.9 (*)    All other components within normal limits  BLOOD GAS, VENOUS - Abnormal; Notable for the following components:   pH, Ven 7.48 (*)    pO2, Ven 49.0 (*)    Bicarbonate 34.3 (*)    Acid-Base Excess 9.4 (*)    All  other components  within normal limits  FIBRIN DERIVATIVES D-DIMER ([facility] ONLY) - Abnormal; Notable for the following components:   Fibrin derivatives D-dimer [facility]) 5,866.60 (*)    All other components within normal limits  VALPROIC ACID LEVEL - Abnormal; Notable for the following components:   Valproic Acid Lvl 15 (*)    All other components within normal limits  CULTURE, BLOOD (ROUTINE X 2)  CULTURE, BLOOD (ROUTINE X 2)  LIPASE, BLOOD  LACTIC ACID, PLASMA  TROPONIN I  URINALYSIS, COMPLETE (UACMP) WITH MICROSCOPIC  URINE DRUG SCREEN, QUALITATIVE ([facility] ONLY)   ____________________________________________  EKG  EKG read and interpreted by me shows sinus tachycardia rate of 120 normal axis no acute ST-T wave changes looks similar to EKG from last year. ____________________________________________  RADIOLOGY  ED MD interpretation: Chest x-ray read by radiology reviewed by me shows some atelectasis and bilateral effusions.  CT scan shows no obvious PE but there is a pneumonia in the base.  Official radiology report(s): Ct Angio Chest Pe W And/or Wo Contrast  Result Date: 02/20/2019 CLINICAL DATA:  68 y/o F; PE suspected, intermediate prob, positive D-dimer. EXAM: CT ANGIOGRAPHY CHEST WITH CONTRAST TECHNIQUE: Multidetector CT imaging of the chest was performed using the standard protocol during bolus administration of intravenous contrast. Multiplanar CT image reconstructions and MIPs were obtained to evaluate the vascular anatomy. CONTRAST:  One hundred thirty-five OMNIPAQUE IOHEXOL 350 MG/ML SOLN COMPARISON:  02/01/2019 CT chest, abdomen pelvis. FINDINGS: Cardiovascular: Multiple attempted acquisitions. Motion artifact. Satisfactory opacification of the pulmonary arteries to the segmental level. No pulmonary embolus identified. Satisfactory opacification of the thoracic aorta no aortic dissection or aneurysm. Mild cardiomegaly heart size. No pericardial effusion. Right port catheter tip  projects over lower SVC. Coronary artery and aortic calcific atherosclerosis. Mediastinum/Nodes: No enlarged mediastinal, hilar, or axillary lymph nodes. Thyroid gland, trachea, and esophagus demonstrate no significant findings. Lungs/Pleura: Small right and trace left pleural effusions. Platelike atelectasis in the left lower lobe. Small area of consolidation the base of right lower lobe. No pneumothorax. Few clustered 2-3 mm nodules at the right lung apex are stable. Upper Abdomen: Increased ascites in the upper abdomen with loculations along the liver surface as well as pneumobilia, incompletely visualized. Musculoskeletal: No chest wall abnormality. No acute or significant osseous findings. Review of the MIP images confirms the above findings. IMPRESSION: 1. Motion artifact. No pulmonary embolus identified. 2. Small right and trace left pleural effusions. 3. Small area of consolidation in the right lower lobe may represent pneumonia or atelectasis. 4. Coronary artery and aortic calcific atherosclerosis. 5. Increased ascites in the upper abdomen with loculations along the liver surface as well as pneumobilia, incompletely visualized. Electronically Signed   By: Kristine Garbe M.D.   On: 02/20/2019 01:14   Dg Chest Port 1 View  Result Date: 02/19/2019 CLINICAL DATA:  Sepsis, vomiting EXAM: PORTABLE CHEST 1 VIEW COMPARISON:  Chest CT 02/01/2019 FINDINGS: Right Port-A-Cath in place with the tip in the SVC. Low lung volumes with bibasilar atelectasis and small effusions, right greater than left. Heart is normal size. No acute bony abnormality. IMPRESSION: Low lung volumes with bibasilar atelectasis and small effusions, right greater than left. Electronically Signed   By: Rolm Baptise M.D.   On: 02/19/2019 23:35   US Abdomen Limited Ruq  Result Date: 02/20/2019 CLINICAL DATA:  68 y/o F; right upper quadrant abdominal pain with pneumobilia. History of metastatic liver cancer and cholangiocarcinoma.  EXAM: ULTRASOUND ABDOMEN LIMITED RIGHT UPPER QUADRANT COMPARISON:  02/06/2019 MRI of the abdomen FINDINGS:  Gallbladder: Gallbladder sludge and stones measuring up to 1.9 cm. No gallbladder wall thickening. Negative sonographic Murphy's sign. Common bile duct: Diameter: 5.6 mm.  Common bile duct pneumobilia. Liver: Pneumobilia predominantly in the left lobe. Numerous ill-defined liver masses seen within the right lobe of liver better characterized on the prior MRI of the abdomen. Loculated fluid collections along the surface of the right lobe of liver. Portal vein is patent on color Doppler imaging with normal direction of blood flow towards the liver. IMPRESSION: 1. Intrahepatic and common bile duct pneumobilia. 2. Cholelithiasis and gallbladder sludge. No findings of acute cholecystitis. 3. Ill-defined masses in right lobe of liver better characterized on prior MRI. 4. Loculated fluid collections along the surface of right lobe of liver. Electronically Signed   By: Kristine Garbe M.D.   On: 02/20/2019 04:49    ____________________________________________   PROCEDURES  Procedure(s) performed (including Critical Care):  Procedures   ____________________________________________   INITIAL IMPRESSION / ASSESSMENT AND PLAN / ED COURSE  Patient is hypoxic.  She has a history of cancer and also has pneumonia.  Additionally there is some pneumobilia on the CT scan I will get an ultrasound to elucidate this further.  She has known cancer.      Clinical Course as of Feb 20 646  Tue Feb 20, 2019  0418 Lactic acid, plasma [PM]    Clinical Course User Index [PM] Nena Polio, MD     ____________________________________________   FINAL CLINICAL IMPRESSION(S) / ED DIAGNOSES  Final diagnoses:  Hypoxia  Community acquired pneumonia, unspecified laterality     ED Discharge Orders    None       Note:  This document was prepared using Dragon voice recognition software and  may include unintentional dictation errors.    Nena Polio, MD 02/20/19 [phone]

## 2019-02-20 ENCOUNTER — Emergency Department: Payer: Medicare HMO

## 2019-02-20 ENCOUNTER — Inpatient Hospital Stay: Payer: Medicare HMO

## 2019-02-20 ENCOUNTER — Encounter: Payer: Self-pay | Admitting: Radiology

## 2019-02-20 ENCOUNTER — Other Ambulatory Visit: Payer: Self-pay

## 2019-02-20 DIAGNOSIS — Z833 Family history of diabetes mellitus: Secondary | ICD-10-CM | POA: Diagnosis not present

## 2019-02-20 DIAGNOSIS — K219 Gastro-esophageal reflux disease without esophagitis: Secondary | ICD-10-CM | POA: Diagnosis present

## 2019-02-20 DIAGNOSIS — E782 Mixed hyperlipidemia: Secondary | ICD-10-CM | POA: Diagnosis present

## 2019-02-20 DIAGNOSIS — Z8509 Personal history of malignant neoplasm of other digestive organs: Secondary | ICD-10-CM | POA: Diagnosis not present

## 2019-02-20 DIAGNOSIS — Z515 Encounter for palliative care: Secondary | ICD-10-CM | POA: Diagnosis not present

## 2019-02-20 DIAGNOSIS — R0602 Shortness of breath: Secondary | ICD-10-CM | POA: Diagnosis present

## 2019-02-20 DIAGNOSIS — Z79890 Hormone replacement therapy (postmenopausal): Secondary | ICD-10-CM | POA: Diagnosis not present

## 2019-02-20 DIAGNOSIS — R140 Abdominal distension (gaseous): Secondary | ICD-10-CM | POA: Diagnosis present

## 2019-02-20 DIAGNOSIS — F419 Anxiety disorder, unspecified: Secondary | ICD-10-CM | POA: Diagnosis present

## 2019-02-20 DIAGNOSIS — R188 Other ascites: Secondary | ICD-10-CM | POA: Diagnosis present

## 2019-02-20 DIAGNOSIS — R180 Malignant ascites: Secondary | ICD-10-CM | POA: Diagnosis not present

## 2019-02-20 DIAGNOSIS — F339 Major depressive disorder, recurrent, unspecified: Secondary | ICD-10-CM | POA: Diagnosis present

## 2019-02-20 DIAGNOSIS — Z66 Do not resuscitate: Secondary | ICD-10-CM | POA: Diagnosis present

## 2019-02-20 DIAGNOSIS — C787 Secondary malignant neoplasm of liver and intrahepatic bile duct: Secondary | ICD-10-CM | POA: Diagnosis present

## 2019-02-20 DIAGNOSIS — Z79899 Other long term (current) drug therapy: Secondary | ICD-10-CM | POA: Diagnosis not present

## 2019-02-20 DIAGNOSIS — Z7982 Long term (current) use of aspirin: Secondary | ICD-10-CM | POA: Diagnosis not present

## 2019-02-20 DIAGNOSIS — Z9221 Personal history of antineoplastic chemotherapy: Secondary | ICD-10-CM | POA: Diagnosis not present

## 2019-02-20 DIAGNOSIS — E1140 Type 2 diabetes mellitus with diabetic neuropathy, unspecified: Secondary | ICD-10-CM | POA: Diagnosis present

## 2019-02-20 DIAGNOSIS — D630 Anemia in neoplastic disease: Secondary | ICD-10-CM | POA: Diagnosis present

## 2019-02-20 DIAGNOSIS — C221 Intrahepatic bile duct carcinoma: Secondary | ICD-10-CM

## 2019-02-20 DIAGNOSIS — R0902 Hypoxemia: Secondary | ICD-10-CM | POA: Diagnosis present

## 2019-02-20 DIAGNOSIS — G2581 Restless legs syndrome: Secondary | ICD-10-CM | POA: Diagnosis present

## 2019-02-20 DIAGNOSIS — K8020 Calculus of gallbladder without cholecystitis without obstruction: Secondary | ICD-10-CM | POA: Diagnosis present

## 2019-02-20 DIAGNOSIS — I10 Essential (primary) hypertension: Secondary | ICD-10-CM | POA: Diagnosis present

## 2019-02-20 DIAGNOSIS — C786 Secondary malignant neoplasm of retroperitoneum and peritoneum: Secondary | ICD-10-CM | POA: Diagnosis present

## 2019-02-20 DIAGNOSIS — Z7984 Long term (current) use of oral hypoglycemic drugs: Secondary | ICD-10-CM | POA: Diagnosis not present

## 2019-02-20 DIAGNOSIS — E039 Hypothyroidism, unspecified: Secondary | ICD-10-CM | POA: Diagnosis present

## 2019-02-20 DIAGNOSIS — J189 Pneumonia, unspecified organism: Secondary | ICD-10-CM | POA: Diagnosis present

## 2019-02-20 DIAGNOSIS — G40909 Epilepsy, unspecified, not intractable, without status epilepticus: Secondary | ICD-10-CM | POA: Diagnosis present

## 2019-02-20 LAB — TSH: TSH: 4.456 u[IU]/mL (ref 0.350–4.500)

## 2019-02-20 LAB — GLUCOSE, CAPILLARY
Glucose-Capillary: 102 mg/dL — ABNORMAL HIGH (ref 70–99)
Glucose-Capillary: 87 mg/dL (ref 70–99)

## 2019-02-20 LAB — BODY FLUID CELL COUNT WITH DIFFERENTIAL
Eos, Fluid: 0 %
Lymphs, Fluid: 17 %
Monocyte-Macrophage-Serous Fluid: 30 %
Neutrophil Count, Fluid: 53 %
OTHER CELLS FL: 0 %
Total Nucleated Cell Count, Fluid: 2828 cu mm

## 2019-02-20 LAB — TROPONIN I: Troponin I: <0.03 ng/mL (ref <0.03)

## 2019-02-20 LAB — LACTATE DEHYDROGENASE, PLEURAL OR PERITONEAL FLUID: LD, Fluid: 303 U/L — ABNORMAL HIGH (ref 3–23)

## 2019-02-20 LAB — PROCALCITONIN: Procalcitonin: 0.52 ng/mL

## 2019-02-20 LAB — FIBRIN DERIVATIVES D-DIMER (ARMC ONLY): Fibrin derivatives D-dimer (ARMC): 5866.6 ng/mL (FEU) — ABNORMAL HIGH (ref 0.00–499.00)

## 2019-02-20 LAB — VALPROIC ACID LEVEL: Valproic Acid Lvl: 15 ug/mL — ABNORMAL LOW (ref 50.0–100.0)

## 2019-02-20 MED ORDER — METFORMIN HCL 500 MG PO TABS
500.0000 mg | ORAL_TABLET | Freq: Two times a day (BID) | ORAL | Status: DC
  Administered 2019-02-20 – 2019-02-23 (×7): 500 mg via ORAL
  Filled 2019-02-20 (×8): qty 1

## 2019-02-20 MED ORDER — OXYCODONE HCL 5 MG PO TABS
5.0000 mg | ORAL_TABLET | Freq: Four times a day (QID) | ORAL | Status: DC | PRN
  Administered 2019-02-23: 17:00:00 5 mg via ORAL
  Filled 2019-02-20: qty 1

## 2019-02-20 MED ORDER — SODIUM CHLORIDE 0.9% FLUSH: 3.0000 mL | INTRAVENOUS | Status: DC | PRN

## 2019-02-20 MED ORDER — ONDANSETRON HCL 4 MG/2ML IJ SOLN
4.0000 mg | Freq: Four times a day (QID) | INTRAMUSCULAR | Status: DC | PRN
  Administered 2019-02-20 – 2019-02-21 (×3): 4 mg via INTRAVENOUS
  Filled 2019-02-20 (×4): qty 2

## 2019-02-20 MED ORDER — PHENYTOIN SODIUM EXTENDED 100 MG PO CAPS
100.0000 mg | ORAL_CAPSULE | Freq: Two times a day (BID) | ORAL | Status: DC
  Administered 2019-02-20 – 2019-02-23 (×7): 100 mg via ORAL
  Filled 2019-02-20 (×8): qty 1

## 2019-02-20 MED ORDER — PANTOPRAZOLE SODIUM 40 MG PO TBEC
40.0000 mg | DELAYED_RELEASE_TABLET | Freq: Every day | ORAL | Status: DC
  Administered 2019-02-20 – 2019-02-23 (×4): 40 mg via ORAL
  Filled 2019-02-20 (×4): qty 1

## 2019-02-20 MED ORDER — DIVALPROEX SODIUM 250 MG PO DR TAB
250.0000 mg | DELAYED_RELEASE_TABLET | Freq: Two times a day (BID) | ORAL | Status: DC
  Administered 2019-02-20 – 2019-02-23 (×7): 250 mg via ORAL
  Filled 2019-02-20 (×8): qty 1

## 2019-02-20 MED ORDER — ESCITALOPRAM OXALATE 10 MG PO TABS
20.0000 mg | ORAL_TABLET | Freq: Every day | ORAL | Status: DC
  Administered 2019-02-20 – 2019-02-23 (×4): 20 mg via ORAL
  Filled 2019-02-20 (×4): qty 2

## 2019-02-20 MED ORDER — VITAMIN D3 25 MCG (1000 UNIT) PO TABS
1000.0000 [IU] | ORAL_TABLET | Freq: Every day | ORAL | Status: DC
  Administered 2019-02-20 – 2019-02-23 (×4): 1000 [IU] via ORAL
  Filled 2019-02-20 (×8): qty 1

## 2019-02-20 MED ORDER — ACETAMINOPHEN 650 MG RE SUPP: 650.0000 mg | Freq: Four times a day (QID) | RECTAL | Status: DC | PRN

## 2019-02-20 MED ORDER — ASPIRIN EC 81 MG PO TBEC
81.0000 mg | DELAYED_RELEASE_TABLET | Freq: Every day | ORAL | Status: DC
  Administered 2019-02-20 – 2019-02-23 (×4): 81 mg via ORAL
  Filled 2019-02-20 (×4): qty 1

## 2019-02-20 MED ORDER — SODIUM CHLORIDE 0.9 % IV SOLN
500.0000 mg | INTRAVENOUS | Status: DC
  Administered 2019-02-20 – 2019-02-23 (×4): 500 mg via INTRAVENOUS
  Filled 2019-02-20 (×4): qty 500

## 2019-02-20 MED ORDER — SODIUM CHLORIDE 0.9% FLUSH
3.0000 mL | Freq: Two times a day (BID) | INTRAVENOUS | Status: DC
  Administered 2019-02-20 – 2019-02-23 (×4): 3 mL via INTRAVENOUS

## 2019-02-20 MED ORDER — LEVOTHYROXINE SODIUM 50 MCG PO TABS
50.0000 ug | ORAL_TABLET | Freq: Every day | ORAL | Status: DC
  Administered 2019-02-20 – 2019-02-23 (×4): 50 ug via ORAL
  Filled 2019-02-20 (×4): qty 1

## 2019-02-20 MED ORDER — ATORVASTATIN CALCIUM 20 MG PO TABS
20.0000 mg | ORAL_TABLET | Freq: Every day | ORAL | Status: DC
  Administered 2019-02-20 – 2019-02-23 (×4): 20 mg via ORAL
  Filled 2019-02-20 (×4): qty 1

## 2019-02-20 MED ORDER — ONDANSETRON HCL 4 MG PO TABS: 4.0000 mg | ORAL_TABLET | Freq: Four times a day (QID) | ORAL | Status: DC | PRN

## 2019-02-20 MED ORDER — SODIUM CHLORIDE 0.9 % IV SOLN
1.0000 g | INTRAVENOUS | Status: DC
  Administered 2019-02-20 – 2019-02-23 (×4): 1 g via INTRAVENOUS
  Filled 2019-02-20 (×2): qty 10
  Filled 2019-02-20 (×2): qty 1
  Filled 2019-02-20: qty 10

## 2019-02-20 MED ORDER — SODIUM CHLORIDE 0.9 % IV SOLN: 250.0000 mL | INTRAVENOUS | Status: DC | PRN

## 2019-02-20 MED ORDER — FUROSEMIDE 40 MG PO TABS
40.0000 mg | ORAL_TABLET | Freq: Every day | ORAL | Status: DC
  Administered 2019-02-20 – 2019-02-23 (×4): 40 mg via ORAL
  Filled 2019-02-20 (×4): qty 1

## 2019-02-20 MED ORDER — ENOXAPARIN SODIUM 40 MG/0.4ML ~~LOC~~ SOLN
40.0000 mg | SUBCUTANEOUS | Status: DC
  Administered 2019-02-21 – 2019-02-23 (×3): 40 mg via SUBCUTANEOUS
  Filled 2019-02-20 (×3): qty 0.4

## 2019-02-20 MED ORDER — POTASSIUM CHLORIDE CRYS ER 10 MEQ PO TBCR
10.0000 meq | EXTENDED_RELEASE_TABLET | Freq: Once | ORAL | Status: AC
  Administered 2019-02-20: 10 meq via ORAL
  Filled 2019-02-20: qty 1

## 2019-02-20 MED ORDER — LAMOTRIGINE 100 MG PO TABS
150.0000 mg | ORAL_TABLET | Freq: Two times a day (BID) | ORAL | Status: DC
  Administered 2019-02-20 – 2019-02-23 (×7): 150 mg via ORAL
  Filled 2019-02-20 (×7): qty 2

## 2019-02-20 MED ORDER — POLYETHYLENE GLYCOL 3350 17 G PO PACK: 17.0000 g | PACK | Freq: Every day | ORAL | Status: DC | PRN

## 2019-02-20 MED ORDER — ACETAMINOPHEN 325 MG PO TABS: 650.0000 mg | ORAL_TABLET | Freq: Four times a day (QID) | ORAL | Status: DC | PRN

## 2019-02-20 MED ORDER — ADULT MULTIVITAMIN W/MINERALS CH
1.0000 | ORAL_TABLET | Freq: Every day | ORAL | Status: DC
  Administered 2019-02-20 – 2019-02-23 (×4): 1 via ORAL
  Filled 2019-02-20 (×5): qty 1

## 2019-02-20 MED ORDER — CLONAZEPAM 0.5 MG PO TABS
0.5000 mg | ORAL_TABLET | Freq: Every day | ORAL | Status: DC
  Administered 2019-02-20 – 2019-02-22 (×3): 0.5 mg via ORAL
  Filled 2019-02-20 (×3): qty 1

## 2019-02-20 MED ORDER — BREXPIPRAZOLE 3 MG PO TABS
3.0000 mg | ORAL_TABLET | Freq: Every morning | ORAL | Status: DC
  Administered 2019-02-20: 3 mg via ORAL
  Filled 2019-02-20 (×2): qty 1

## 2019-02-20 MED ORDER — INSULIN ASPART 100 UNIT/ML ~~LOC~~ SOLN
0.0000 [IU] | Freq: Three times a day (TID) | SUBCUTANEOUS | Status: DC
  Administered 2019-02-21 – 2019-02-22 (×2): 2 [IU] via SUBCUTANEOUS
  Administered 2019-02-23: 1 [IU] via SUBCUTANEOUS
  Filled 2019-02-20 (×3): qty 1

## 2019-02-20 MED ORDER — GABAPENTIN 300 MG PO CAPS
400.0000 mg | ORAL_CAPSULE | Freq: Two times a day (BID) | ORAL | Status: DC
  Administered 2019-02-20 – 2019-02-23 (×7): 400 mg via ORAL
  Filled 2019-02-20 (×8): qty 1

## 2019-02-20 MED ORDER — IOHEXOL 350 MG/ML SOLN
75.0000 mL | Freq: Once | INTRAVENOUS | Status: AC | PRN
  Administered 2019-02-20: 75 mL via INTRAVENOUS

## 2019-02-20 MED ORDER — SODIUM CHLORIDE 0.9 % IV SOLN
1.0000 g | Freq: Once | INTRAVENOUS | Status: AC
  Administered 2019-02-20: 1 g via INTRAVENOUS
  Filled 2019-02-20: qty 1

## 2019-02-20 NOTE — ED Notes (Signed)
ED TO INPATIENT HANDOFF REPORT  ED Nurse Name and Phone #:  Anda Kraft [phone]  S Name/Age/Gender Kaylee Wolf 68 y.o. female Room/Bed: ED01A/ED01A  Code Status   Code Status: Full Code  Home/SNF/Other Home Patient oriented to: self Is this baseline? Yes   Triage Complete: Triage complete  Chief Complaint Weakness  Triage Note Pt to ED via EMS from home, pt c/o vomiting new today denies any abd pain or pain anywhere. Pt states she vomited 3 times, last time around 2220. Pt was recently diagnosed with cancer and had port placement last week, has first chemo Thursday. NAD.    Allergies No Known Allergies  Level of Care/Admitting Diagnosis ED Disposition    ED Disposition Condition [facility] Area: [place] [[id number]]  Level of Care: Med-Surg [16]  Diagnosis: SOB (shortness of breath) [[id number]]  Admitting Physician: Dustin Flock [[id number]]  Attending Physician: Dustin Flock [[id number]]  Estimated length of stay: past midnight tomorrow  Certification:: I certify this patient will need inpatient services for at least 2 midnights  PT Class (Do Not Modify): Inpatient [101]  PT Acc Code (Do Not Modify): Private [1]       B Medical/Surgery History Past Medical History:  Diagnosis Date  . Anxiety   . Colon polyp 11/29/2012   at [place].  . Insomnia   . Mixed hyperlipidemia   . Recurrent major depressive disorder ([place])   . RLS (restless legs syndrome)   . Seizures ([place])   . Tinea pedis of both feet   . Type 2 diabetes mellitus ([place]) 2014?  [name] Varicose veins    Past Surgical History:  Procedure Laterality Date  . COLONOSCOPY  11/29/2012   Dr [name]  . ERCP N/A 02/01/2019   Procedure: ENDOSCOPIC RETROGRADE CHOLANGIOPANCREATOGRAPHY (ERCP);  Surgeon: [name], MD;  Location: [facility] ENDOSCOPY;  Service: Endoscopy;  Laterality: N/A;  . none    . PORTA CATH INSERTION N/A 02/15/2019    Procedure: PORTA CATH INSERTION;  Surgeon: Algernon Huxley, MD;  Location: [facility] CV LAB;  Service: Cardiovascular;  Laterality: N/A;     A IV Location/Drains/Wounds Patient Lines/Drains/Airways Status   Active Line/Drains/Airways    Name:   Placement date:   Placement time:   Site:   Days:   Peripheral IV 02/19/19 Left Forearm   02/19/19    2254    Forearm   1   Peripheral IV 02/19/19 Right Forearm   02/19/19    2255    Forearm   1   Pressure Injury 02/01/19 Stage I -  Intact skin with non-blanchable redness of a localized area usually over a bony prominence. red area with some bruising    02/01/19    2009     19          Intake/Output Last 24 hours  Intake/Output Summary (Last 24 hours) at 02/20/2019 1528 Last data filed at 02/20/2019 2505 Gross per 24 hour  Intake 343.79 ml  Output -  Net 343.79 ml    Labs/Imaging Results for orders placed or performed during the hospital encounter of 02/19/19 (from the past 48 hour(s))  Lipase, blood     Status: None   Collection Time: 02/19/19 10:56 PM  Result Value Ref Range   Lipase 30 11 - 51 U/L    Comment: Performed at [facility], [address] [place], [place] [address]  Comprehensive metabolic panel  Status: Abnormal   Collection Time: 02/19/19 10:56 PM  Result Value Ref Range   Sodium 136 135 - 145 mmol/L   Potassium 4.1 3.5 - 5.1 mmol/L   Chloride 97 (L) 98 - 111 mmol/L   CO2 30 22 - 32 mmol/L   Glucose, Bld 164 (H) 70 - 99 mg/dL   BUN 12 8 - 23 mg/dL   Creatinine, Ser 0.59 0.44 - 1.00 mg/dL   Calcium 8.2 (L) 8.9 - 10.3 mg/dL   Total Protein 6.1 (L) 6.5 - 8.1 g/dL   Albumin 2.1 (L) 3.5 - 5.0 g/dL   AST 25 15 - 41 U/L   ALT 15 0 - 44 U/L   Alkaline Phosphatase 264 (H) 38 - 126 U/L   Total Bilirubin 0.8 0.3 - 1.2 mg/dL   GFR calc non Af Amer >60 >60 mL/min   GFR calc Af Amer >60 >60 mL/min   Anion gap 9 5 - 15    Comment: Performed at [facility], [address] [place], [place] [address]   Lactic acid, plasma     Status: None   Collection Time: 02/19/19 10:56 PM  Result Value Ref Range   Lactic Acid, Venous 0.9 0.5 - 1.9 mmol/L    Comment: Performed at [facility], [address] [place], [place] [address]  CBC with Differential     Status: Abnormal   Collection Time: 02/19/19 10:56 PM  Result Value Ref Range   WBC 8.0 4.0 - 10.5 K/uL   RBC 4.15 3.87 - 5.11 MIL/uL   Hemoglobin 10.9 (L) 12.0 - 15.0 g/dL   HCT 35.5 (L) 36.0 - 46.0 %   MCV 85.5 80.0 - 100.0 fL   MCH 26.3 26.0 - 34.0 pg   MCHC 30.7 30.0 - 36.0 g/dL   RDW 16.3 (H) 11.5 - 15.5 %   Platelets 609 (H) 150 - 400 K/uL   nRBC 0.0 0.0 - 0.2 %   Neutrophils Relative % 73 %   Neutro Abs 5.9 1.7 - 7.7 K/uL   Lymphocytes Relative 10 %   Lymphs Abs 0.8 0.7 - 4.0 K/uL   Monocytes Relative 15 %   Monocytes Absolute 1.2 (H) 0.1 - 1.0 K/uL   Eosinophils Relative 0 %   Eosinophils Absolute 0.0 0.0 - 0.5 K/uL   Basophils Relative 1 %   Basophils Absolute 0.1 0.0 - 0.1 K/uL   Immature Granulocytes 1 %   Abs Immature Granulocytes 0.11 (H) 0.00 - 0.07 K/uL    Comment: Performed at [facility], [address] [place], [place] [address]  Protime-INR     Status: Abnormal   Collection Time: 02/19/19 10:56 PM  Result Value Ref Range   Prothrombin Time 16.9 (H) 11.4 - 15.2 seconds   INR 1.4     Comment: Performed at [facility], [address] [place], [place] [address]  Culture, blood (Routine x 2)     Status: None (Preliminary result)   Collection Time: 02/19/19 10:56 PM  Result Value Ref Range   Specimen Description BLOOD LEFT WRIST    Special Requests      BOTTLES DRAWN AEROBIC AND ANAEROBIC Blood Culture results may not be optimal due to an excessive volume of blood received in culture bottles   Culture      NO GROWTH < 12 HOURS Performed at [facility], [address] [place], [place] [address]    Report Status PENDING   Culture, blood (Routine x 2)  Status:  None (Preliminary result)   Collection Time: 02/19/19 10:56 PM  Result Value Ref Range   Specimen Description BLOOD RIGHT WRIST    Special Requests      BOTTLES DRAWN AEROBIC AND ANAEROBIC Blood Culture results may not be optimal due to an excessive volume of blood received in culture bottles   Culture      NO GROWTH < 12 HOURS Performed at [facility], [address] [place], [place] [address]    Report Status PENDING   Blood gas, venous     Status: Abnormal   Collection Time: 02/19/19 10:56 PM  Result Value Ref Range   pH, Ven 7.48 (H) 7.250 - 7.430   pCO2, Ven 46 44.0 - 60.0 mmHg   pO2, Ven 49.0 (H) 32.0 - 45.0 mmHg   Bicarbonate 34.3 (H) 20.0 - 28.0 mmol/L   Acid-Base Excess 9.4 (H) 0.0 - 2.0 mmol/L   O2 Saturation 87.2 %   Patient temperature 37.0    Collection site VENOUS    Sample type VENOUS     Comment: Performed at [facility], [address] [place], [place] [address]  Troponin I - Add-On to previous collection     Status: None   Collection Time: 02/19/19 10:56 PM  Result Value Ref Range   Troponin I <0.03 <0.03 ng/mL    Comment: Performed at [facility], [address] [place], [place] [address]  Fibrin derivatives D-Dimer     Status: Abnormal   Collection Time: 02/19/19 10:56 PM  Result Value Ref Range   Fibrin derivatives D-dimer ([facility]) 5,866.60 (H) 0.00 - 499.00 ng/mL (FEU)    Comment: (NOTE) <> Exclusion of Venous Thromboembolism (VTE) - OUTPATIENT ONLY   (Emergency Department or [place])   0-499 ng/ml (FEU): With a low to intermediate pretest probability                      for VTE this test result excludes the diagnosis                      of VTE.   >499 ng/ml (FEU) : VTE not excluded; additional work up for VTE is                      required. <> Testing on Inpatients and Evaluation of Disseminated Intravascular   Coagulation (DIC) Reference Range:   0-499 ng/ml (FEU) Performed at [facility], [address] [place], [place] [address]   Valproic acid level     Status: Abnormal   Collection Time: 02/20/19 12:20 AM  Result Value Ref Range   Valproic Acid Lvl 15 (L) 50.0 - 100.0 ug/mL    Comment: Performed at [facility], [place], [place] [address]  Lactate dehydrogenase (pleural or peritoneal fluid)     Status: Abnormal   Collection Time: 02/20/19 10:25 AM  Result Value Ref Range   LD, Fluid 303 (H) 3 - 23 U/L    Comment: (NOTE) Results should be evaluated in conjunction with serum values    Fluid Type-FLDH PERITONEAL     Comment: Performed at [facility], [address] [place],  [address] CORRECTED ON 02/25 AT 1110: PREVIOUSLY REPORTED AS CYTOPERI   Body fluid cell count with differential     Status: Abnormal   Collection Time: 02/20/19 10:25 AM  Result Value Ref Range   Fluid Type-FCT PERITONEAL  Comment: CORRECTED ON 02/25 AT 1110: PREVIOUSLY REPORTED AS CYTOPERI   Color, Fluid YELLOW YELLOW   Appearance, Fluid HAZY (A) CLEAR   WBC, Fluid 2,828 cu mm   Neutrophil Count, Fluid 53 %   Lymphs, Fluid 17 %   Monocyte-Macrophage-Serous Fluid 30 %   Eos, Fluid 0 %   Other Cells, Fluid 0 %    Comment: Performed at [facility], [address] [place], [place] [address]  Glucose, capillary     Status: Abnormal   Collection Time: 02/20/19  1:23 PM  Result Value Ref Range   Glucose-Capillary 102 (H) 70 - 99 mg/dL   Ct Angio Chest Pe W And/or Wo Contrast  Result Date: 02/20/2019 CLINICAL DATA:  68 y/o F; PE suspected, intermediate prob, positive D-dimer. EXAM: CT ANGIOGRAPHY CHEST WITH CONTRAST TECHNIQUE: Multidetector CT imaging of the chest was performed using the standard protocol during bolus administration of intravenous contrast. Multiplanar CT image reconstructions and MIPs were obtained to evaluate the vascular anatomy. CONTRAST:  One hundred thirty-five OMNIPAQUE IOHEXOL 350 MG/ML SOLN COMPARISON:  02/01/2019 CT chest,  abdomen pelvis. FINDINGS: Cardiovascular: Multiple attempted acquisitions. Motion artifact. Satisfactory opacification of the pulmonary arteries to the segmental level. No pulmonary embolus identified. Satisfactory opacification of the thoracic aorta no aortic dissection or aneurysm. Mild cardiomegaly heart size. No pericardial effusion. Right port catheter tip projects over lower SVC. Coronary artery and aortic calcific atherosclerosis. Mediastinum/Nodes: No enlarged mediastinal, hilar, or axillary lymph nodes. Thyroid gland, trachea, and esophagus demonstrate no significant findings. Lungs/Pleura: Small right and trace left pleural effusions. Platelike atelectasis in the left lower lobe. Small area of consolidation the base of right lower lobe. No pneumothorax. Few clustered 2-3 mm nodules at the right lung apex are stable. Upper Abdomen: Increased ascites in the upper abdomen with loculations along the liver surface as well as pneumobilia, incompletely visualized. Musculoskeletal: No chest wall abnormality. No acute or significant osseous findings. Review of the MIP images confirms the above findings. IMPRESSION: 1. Motion artifact. No pulmonary embolus identified. 2. Small right and trace left pleural effusions. 3. Small area of consolidation in the right lower lobe may represent pneumonia or atelectasis. 4. Coronary artery and aortic calcific atherosclerosis. 5. Increased ascites in the upper abdomen with loculations along the liver surface as well as pneumobilia, incompletely visualized. Electronically Signed   By: Kristine Garbe M.D.   On: 02/20/2019 01:14   US Paracentesis  Result Date: 02/20/2019 INDICATION: Cholangiocarcinoma. Abdominal distention and ascites. Request for diagnostic and therapeutic paracentesis. EXAM: ULTRASOUND GUIDED LEFT LOWER QUADRANT PARACENTESIS MEDICATIONS: None. COMPLICATIONS: None immediate. PROCEDURE: Informed written consent was obtained from the patient after a  discussion of the risks, benefits and alternatives to treatment. A timeout was performed prior to the initiation of the procedure. Initial ultrasound scanning demonstrates a moderate amount of ascites within the left lower abdominal quadrant. The left lower abdomen was prepped and draped in the usual sterile fashion. 1% lidocaine with epinephrine was used for local anesthesia. Following this, a 6 Fr Safe-T-Centesis catheter was introduced. An ultrasound image was saved for documentation purposes. The paracentesis was performed. The catheter was removed and a dressing was applied. The patient tolerated the procedure well without immediate post procedural complication. FINDINGS: A total of approximately 2.8 L of clear yellow fluid was removed. Samples were sent to the laboratory as requested by the clinical team. IMPRESSION: Successful ultrasound-guided paracentesis yielding 2.8 liters of peritoneal fluid. Read by: Ascencion Dike PA-C Electronically Signed   By: Jenny Reichmann  Watts M.D.   On: 02/20/2019 11:17   Dg Chest Port 1 View  Result Date: 02/19/2019 CLINICAL DATA:  Sepsis, vomiting EXAM: PORTABLE CHEST 1 VIEW COMPARISON:  Chest CT 02/01/2019 FINDINGS: Right Port-A-Cath in place with the tip in the SVC. Low lung volumes with bibasilar atelectasis and small effusions, right greater than left. Heart is normal size. No acute bony abnormality. IMPRESSION: Low lung volumes with bibasilar atelectasis and small effusions, right greater than left. Electronically Signed   By: Rolm Baptise M.D.   On: 02/19/2019 23:35   US Abdomen Limited Ruq  Result Date: 02/20/2019 CLINICAL DATA:  68 y/o F; right upper quadrant abdominal pain with pneumobilia. History of metastatic liver cancer and cholangiocarcinoma. EXAM: ULTRASOUND ABDOMEN LIMITED RIGHT UPPER QUADRANT COMPARISON:  02/06/2019 MRI of the abdomen FINDINGS: Gallbladder: Gallbladder sludge and stones measuring up to 1.9 cm. No gallbladder wall thickening. Negative  sonographic Murphy's sign. Common bile duct: Diameter: 5.6 mm.  Common bile duct pneumobilia. Liver: Pneumobilia predominantly in the left lobe. Numerous ill-defined liver masses seen within the right lobe of liver better characterized on the prior MRI of the abdomen. Loculated fluid collections along the surface of the right lobe of liver. Portal vein is patent on color Doppler imaging with normal direction of blood flow towards the liver. IMPRESSION: 1. Intrahepatic and common bile duct pneumobilia. 2. Cholelithiasis and gallbladder sludge. No findings of acute cholecystitis. 3. Ill-defined masses in right lobe of liver better characterized on prior MRI. 4. Loculated fluid collections along the surface of right lobe of liver. Electronically Signed   By: Kristine Garbe M.D.   On: 02/20/2019 04:49    Pending Labs Unresulted Labs (From admission, onward)    Start     Ordered   02/27/19 0500  Creatinine, serum  (enoxaparin (LOVENOX)    CrCl >/= 30 ml/min)  Weekly,   STAT    Comments:  while on enoxaparin therapy    02/20/19 1352   02/21/19 0500  CBC  Tomorrow morning,   STAT     02/20/19 1352   02/21/19 0500  Comprehensive metabolic panel  Tomorrow morning,   STAT     02/20/19 1352   02/20/19 1353  Procalcitonin - Baseline  ONCE - STAT,   STAT     02/20/19 1352   02/20/19 1353  CBC  (enoxaparin (LOVENOX)    CrCl >/= 30 ml/min)  Once,   STAT    Comments:  Baseline for enoxaparin therapy IF NOT ALREADY DRAWN.  Notify MD if PLT < 100 K.    02/20/19 1352   02/20/19 1353  Creatinine, serum  (enoxaparin (LOVENOX)    CrCl >/= 30 ml/min)  Once,   STAT    Comments:  Baseline for enoxaparin therapy IF NOT ALREADY DRAWN.    02/20/19 1352   02/20/19 1353  TSH  Once,   STAT     02/20/19 1352   02/20/19 1039  Body fluid culture  R     02/20/19 1039   02/20/19 1025  Pathologist smear review  Once,   R     02/20/19 1025   02/20/19 0219  Urine Drug Screen, Qualitative  Once,   STAT     02/20/19  0218   02/19/19 2244  Urinalysis, Complete w Microscopic  ONCE - STAT,   STAT     02/19/19 2244          Vitals/Pain Today's Vitals   02/20/19 1048 02/20/19 1142 02/20/19 1352 02/20/19 1455  BP: (!) 91/59     Pulse: (!) 102     Resp:      Temp:      TempSrc:      SpO2: 95%     Weight:      Height:      PainSc:  Asleep 0-No pain Asleep    Isolation Precautions No active isolations  Medications Medications  sodium chloride flush (NS) 0.9 % injection 3 mL (3 mLs Intravenous Not Given 02/19/19 2311)  sodium chloride 0.9 % bolus 1,000 mL (0 mLs Intravenous Stopped 02/19/19 2323)  enoxaparin (LOVENOX) injection 40 mg (0 mg Subcutaneous Hold 02/20/19 1459)  sodium chloride flush (NS) 0.9 % injection 3 mL (0 mLs Intravenous Hold 02/20/19 1500)  sodium chloride flush (NS) 0.9 % injection 3 mL (has no administration in time range)  0.9 %  sodium chloride infusion (has no administration in time range)  acetaminophen (TYLENOL) tablet 650 mg (has no administration in time range)    Or  acetaminophen (TYLENOL) suppository 650 mg (has no administration in time range)  ondansetron (ZOFRAN) tablet 4 mg (has no administration in time range)    Or  ondansetron (ZOFRAN) injection 4 mg (has no administration in time range)  cefTRIAXone (ROCEPHIN) 1 g in sodium chloride 0.9 % 100 mL IVPB (0 g Intravenous Stopped 02/20/19 0855)  azithromycin (ZITHROMAX) 500 mg in sodium chloride 0.9 % 250 mL IVPB (0 mg Intravenous Stopped 02/20/19 0959)  aspirin EC tablet 81 mg (81 mg Oral Given 02/20/19 0952)  atorvastatin (LIPITOR) tablet 20 mg (20 mg Oral Given 02/20/19 0952)  cholecalciferol (VITAMIN D) tablet 1,000 Units (1,000 Units Oral Given 02/20/19 0952)  clonazePAM (KLONOPIN) tablet 0.5 mg (has no administration in time range)  divalproex (DEPAKOTE) DR tablet 250 mg (250 mg Oral Given 02/20/19 0952)  escitalopram (LEXAPRO) tablet 20 mg (20 mg Oral Given 02/20/19 0952)  furosemide (LASIX) tablet 40 mg (40 mg  Oral Given 02/20/19 0952)  gabapentin (NEURONTIN) capsule 400 mg (400 mg Oral Given 02/20/19 1319)  lamoTRIgine (LAMICTAL) tablet 150 mg (150 mg Oral Given 02/20/19 0953)  levothyroxine (SYNTHROID, LEVOTHROID) tablet 50 mcg (50 mcg Oral Given 02/20/19 0952)  metFORMIN (GLUCOPHAGE) tablet 500 mg (500 mg Oral Given 02/20/19 1319)  multivitamin with minerals tablet 1 tablet (1 tablet Oral Given 02/20/19 0952)  pantoprazole (PROTONIX) EC tablet 40 mg (40 mg Oral Given 02/20/19 0952)  oxyCODONE (Oxy IR/ROXICODONE) immediate release tablet 5 mg (has no administration in time range)  phenytoin (DILANTIN) ER capsule 100 mg (100 mg Oral Given 02/20/19 0953)  polyethylene glycol (MIRALAX / GLYCOLAX) packet 17 g (has no administration in time range)  Brexpiprazole TABS 3 mg (3 mg Oral Given 02/20/19 1319)  insulin aspart (novoLOG) injection 0-9 Units (0 Units Subcutaneous Not Given 02/20/19 1325)  iohexol (OMNIPAQUE) 350 MG/ML injection 75 mL (75 mLs Intravenous Contrast Given 02/20/19 0046)  ceFEPIme (MAXIPIME) 1 g in sodium chloride 0.9 % 100 mL IVPB (0 g Intravenous Stopped 02/20/19 0334)  potassium chloride (K-DUR,KLOR-CON) CR tablet 10 mEq (10 mEq Oral Given 02/20/19 1319)    Mobility walks with person assist Low fall risk   Focused Assessments    R Recommendations: See Admitting Provider Note  Report given to:   Additional Notes:

## 2019-02-20 NOTE — Procedures (Signed)
  PROCEDURE SUMMARY:  Successful US guided paracentesis from LLQ.  Yielded 2.8 L of clear yellow fluid.  No immediate complications.  Pt tolerated well.   Specimen was sent for labs.  EBL < 22mL  Ascencion Dike PA-C 02/20/2019 11:15 AM

## 2019-02-20 NOTE — ED Notes (Signed)
Patient transported to CT 

## 2019-02-20 NOTE — ED Notes (Signed)
Meal tray provided to pt. Family member at bedside.

## 2019-02-20 NOTE — ED Notes (Signed)
SWOT RN at bedside.  

## 2019-02-20 NOTE — ED Notes (Signed)
[name], daughter to be updated: [phone]

## 2019-02-20 NOTE — Care Management Note (Signed)
Case Management Note  Patient Details  Name: Kaylee Wolf MRN: [id number] Date of Birth: [birth year]  Subjective/Objective:                 Patient recently discharged from [facility]  after a new diagnosis of metastatic  cholangiocarcinoma. Patient has a walker and bedside commode. She presents today with vomiting and weakness. She has recently had a port placed and is anticipated to start chemo after discharge.  Is currently open to Advanced RN PT OT SW   Action/Plan  Expected Discharge Date:                  Expected Discharge Plan:     In-House Referral:     Discharge planning Services     Post Acute Care Choice:    Choice offered to:     DME Arranged:    DME Agency:     HH Arranged:    HH Agency:     Status of Service:     If discussed at [employer], dates discussed:    Additional Comments:  Katrina Stack, RN 02/20/2019, 6:25 PM

## 2019-02-20 NOTE — H&P (Signed)
[facility] NAME: Kaylee Wolf    MR#:  [id number]  DATE OF BIRTH:  01-08-1951  DATE OF ADMISSION:  02/19/2019  PRIMARY CARE PHYSICIAN: Remi Haggard, FNP   REQUESTING/REFERRING PHYSICIAN: Nena Polio, MD  CHIEF COMPLAINT:   Chief Complaint  Patient presents with  . Weakness  . Emesis    HISTORY OF PRESENT ILLNESS: Kaylee Wolf  is a 68 y.o. 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  Patient's daughter states that yesterday she was doing okay was able to ambulate some with PT.  Then all of a sudden she started becoming very weak and limp.  Patient also has had some dry cough.  Patient currently drowsy unable to provide any review of systems.  Daughter states that patient also is having more shortness of breath.   PAST MEDICAL HISTORY:   Past Medical History:  Diagnosis Date  . Anxiety   . Colon polyp 11/29/2012   at [place].  . Insomnia   . Mixed hyperlipidemia   . Recurrent major depressive disorder ([place])   . RLS (restless legs syndrome)   . Seizures ([place])   . Tinea pedis of both feet   . Type 2 diabetes mellitus ([place]) 2014?  [name] Varicose veins     PAST SURGICAL HISTORY:  Past Surgical History:  Procedure Laterality Date  . COLONOSCOPY  11/29/2012   Dr [name]  . ERCP N/A 02/01/2019   Procedure: ENDOSCOPIC RETROGRADE CHOLANGIOPANCREATOGRAPHY (ERCP);  Surgeon: [name], MD;  Location: [facility] ENDOSCOPY;  Service: Endoscopy;  Laterality: N/A;  . none    . PORTA CATH INSERTION N/A 02/15/2019   Procedure: PORTA CATH INSERTION;  Surgeon: Algernon Huxley, MD;  Location: [facility];  Service: Cardiovascular;  Laterality: N/A;    SOCIAL HISTORY:  Social History   Tobacco Use  . Smoking status: Never Smoker  . Smokeless tobacco:  Never Used  Substance Use Topics  . Alcohol use: No    Alcohol/week: 0.0 standard drinks    FAMILY HISTORY:  Family History  Problem Relation Age of Onset  . Diabetes Mother   . Heart disease Mother   . Cancer Father   . Diabetes Brother   . Hypertension Brother   . Stroke Brother   . Hypertension Brother   . Breast cancer Neg Hx     DRUG ALLERGIES: No Known Allergies  REVIEW OF SYSTEMS:   CONSTITUTIONAL: Limited due to patient being drowsy  MEDICATIONS AT HOME:  Prior to Admission medications   Medication Sig Start Date End Date Taking? Authorizing Provider  acetaminophen (TYLENOL) 325 MG tablet Take 2 tablets (650 mg total) by mouth every 8 (eight) hours as needed for mild pain (or Fever >/= 101). 02/06/19   Nicholes Mango, MD  aspirin EC 81 MG EC tablet Take 1 tablet (81 mg total) by mouth daily. 02/03/18   Loletha Grayer, MD  atorvastatin (LIPITOR) 20 MG tablet Take 1 tablet (20 mg total) by mouth daily. 09/06/17   Karamalegos, Devonne Doughty, DO  blood glucose meter kit and supplies KIT Dispense based on patient and insurance preference. Check blood glucose once daily. 11/25/15   Luciana Axe, NP  Cholecalciferol (VITAMIN D-3) 1000 UNITS CAPS Take 1 capsule by mouth daily.     [provider]  dexamethasone (DECADRON) 4 MG tablet Take 2 tablets by mouth once a day on the day after chemotherapy and then take 2 tablets two times a day for 2 days. Take with food. 02/12/19   Sindy Guadeloupe, MD  divalproex (DEPAKOTE) 125 MG DR tablet Take 1 pill in am and 2 pill at night 02/21/17   Rainey Pines, MD  escitalopram (LEXAPRO) 20 MG tablet Take 1 tablet (20 mg total) by mouth daily. 09/06/17   Karamalegos, Devonne Doughty, DO  furosemide (LASIX) 40 MG tablet Take 40 mg by mouth daily.  01/17/19   [provider]  gabapentin (NEURONTIN) 400 MG capsule Take 1 capsule (400 mg total) by mouth 2 (two) times daily. 09/06/17   Karamalegos, Devonne Doughty, DO  lamoTRIgine (LAMICTAL) 150  MG tablet Take 150 mg by mouth 2 (two) times daily.  07/20/16   [provider]  levothyroxine (SYNTHROID, LEVOTHROID) 50 MCG tablet Take 50 mcg by mouth daily before breakfast.  12/28/18   [provider]  lidocaine-prilocaine (EMLA) cream Apply to affected area once 02/12/19   Sindy Guadeloupe, MD  linaclotide [facility]) 72 MCG capsule Take 72 mcg by mouth as needed.    [provider]  metFORMIN (GLUCOPHAGE) 500 MG tablet Take 1 tablet (500 mg total) by mouth 2 (two) times daily. 09/06/17   Karamalegos, Devonne Doughty, DO  Multiple Vitamin (MULTIVITAMIN) tablet Take 1 tablet by mouth daily.    [provider]  nitrofurantoin (MACRODANTIN) 100 MG capsule  01/16/19   [provider]  omeprazole (PRILOSEC) 20 MG capsule Take 1 capsule (20 mg total) by mouth daily. Before 30 min before first meal of day 09/06/17   Parks Ranger, Devonne Doughty, DO  ondansetron (ZOFRAN) 8 MG tablet Take 1 tablet (8 mg total) by mouth 2 (two) times daily as needed. Start on the third day after chemotherapy. 02/12/19   Sindy Guadeloupe, MD  ondansetron (ZOFRAN-ODT) 4 MG disintegrating tablet Take 1 tablet (4 mg total) by mouth 2 (two) times daily. 09/06/17   Karamalegos, Devonne Doughty, DO  oxyCODONE (OXY IR/ROXICODONE) 5 MG immediate release tablet Take 1 tablet (5 mg total) by mouth every 6 (six) hours as needed for moderate pain. 02/06/19   Nicholes Mango, MD  phenytoin (DILANTIN) 200 MG ER capsule Take 100 mg by mouth 2 (two) times daily.  11/17/15   [provider]  polyethylene glycol (MIRALAX / GLYCOLAX) packet Take 17 g by mouth daily as needed for mild constipation. 02/06/19   Gouru, Illene Silver, MD  potassium chloride (K-DUR,KLOR-CON) 10 MEQ tablet Take 10 mEq by mouth once.  10/27/18   [provider]  prochlorperazine (COMPAZINE) 10 MG tablet Take 1 tablet (10 mg total) by mouth every 6 (six) hours as needed (Nausea or vomiting). 02/12/19   Sindy Guadeloupe, MD  REXULTI 3 MG TABS 3 mg  every morning.  01/19/19   [provider]      PHYSICAL EXAMINATION:   VITAL SIGNS: Blood pressure 129/78, pulse (!) 102, temperature 98.1 F (36.7 C), temperature source Oral, resp. rate 20, height '5\' 5"'  (1.651 m), weight 89.7 kg, last menstrual period 05/10/2012, SpO2 92 %.  GENERAL:  68 y.o.-year-old patient lying in the bed chronically ill-appearing EYES: Pupils equal, round, reactive to light and accommodation. No scleral icterus. Extraocular muscles intact.  HEENT: Head atraumatic, normocephalic. Oropharynx and nasopharynx clear.  NECK:  Supple, no jugular venous distention. No thyroid enlargement, no tenderness.  LUNGS: Normal breath sounds bilaterally, no wheezing,  rales,rhonchi or crepitation. No use of accessory muscles of respiration.  CARDIOVASCULAR: S1, S2 normal. No murmurs, rubs, or gallops.  ABDOMEN: Distended no guarding no rebound EXTREMITIES: No pedal edema, cyanosis, or clubbing.  NEUROLOGIC: Patient currently drowsy  pSYCHIATRIC: Patient drowsy SKIN: No obvious rash, lesion, or ulcer.   LABORATORY PANEL:   CBC Recent Labs  Lab 02/19/19 2256  WBC 8.0  HGB 10.9*  HCT 35.5*  PLT 609*  MCV 85.5  MCH 26.3  MCHC 30.7  RDW 16.3*  LYMPHSABS 0.8  MONOABS 1.2*  EOSABS 0.0  BASOSABS 0.1   ------------------------------------------------------------------------------------------------------------------  Chemistries  Recent Labs  Lab 02/19/19 2256  NA 136  K 4.1  CL 97*  CO2 30  GLUCOSE 164*  BUN 12  CREATININE 0.59  CALCIUM 8.2*  AST 25  ALT 15  ALKPHOS 264*  BILITOT 0.8   ------------------------------------------------------------------------------------------------------------------ estimated creatinine clearance is 77.6 mL/min (by C-G formula based on SCr of 0.59 mg/dL). ------------------------------------------------------------------------------------------------------------------ No results for input(s): TSH, T4TOTAL, T3FREE,  THYROIDAB in the last 72 hours.  Invalid input(s): FREET3   Coagulation profile Recent Labs  Lab 02/19/19 2256  INR 1.4   ------------------------------------------------------------------------------------------------------------------- No results for input(s): DDIMER in the last 72 hours. -------------------------------------------------------------------------------------------------------------------  Cardiac Enzymes Recent Labs  Lab 02/19/19 2256  TROPONINI <0.03   ------------------------------------------------------------------------------------------------------------------ Invalid input(s): POCBNP  ---------------------------------------------------------------------------------------------------------------  Urinalysis    Component Value Date/Time   COLORURINE AMBER (A) 01/31/2019 2222   APPEARANCEUR HAZY (A) 01/31/2019 2222   APPEARANCEUR Hazy 04/17/2015 2010   LABSPEC 1.028 01/31/2019 2222   LABSPEC 1.005 04/17/2015 2010   PHURINE 5.0 01/31/2019 2222   GLUCOSEU NEGATIVE 01/31/2019 2222   GLUCOSEU Negative 04/17/2015 2010   HGBUR SMALL (A) 01/31/2019 2222   BILIRUBINUR MODERATE (A) 01/31/2019 2222   BILIRUBINUR neg 03/15/2018 1123   BILIRUBINUR Negative 04/17/2015 2010   KETONESUR NEGATIVE 01/31/2019 2222   PROTEINUR 30 (A) 01/31/2019 2222   UROBILINOGEN 0.2 03/15/2018 1123   NITRITE NEGATIVE 01/31/2019 2222   LEUKOCYTESUR MODERATE (A) 01/31/2019 2222   LEUKOCYTESUR 3+ 04/17/2015 2010     RADIOLOGY: Ct Angio Chest Pe W And/or Wo Contrast  Result Date: 02/20/2019 CLINICAL DATA:  68 y/o F; PE suspected, intermediate prob, positive D-dimer. EXAM: CT ANGIOGRAPHY CHEST WITH CONTRAST TECHNIQUE: Multidetector CT imaging of the chest was performed using the standard protocol during bolus administration of intravenous contrast. Multiplanar CT image reconstructions and MIPs were obtained to evaluate the vascular anatomy. CONTRAST:  One hundred thirty-five  OMNIPAQUE IOHEXOL 350 MG/ML SOLN COMPARISON:  02/01/2019 CT chest, abdomen pelvis. FINDINGS: Cardiovascular: Multiple attempted acquisitions. Motion artifact. Satisfactory opacification of the pulmonary arteries to the segmental level. No pulmonary embolus identified. Satisfactory opacification of the thoracic aorta no aortic dissection or aneurysm. Mild cardiomegaly heart size. No pericardial effusion. Right port catheter tip projects over lower SVC. Coronary artery and aortic calcific atherosclerosis. Mediastinum/Nodes: No enlarged mediastinal, hilar, or axillary lymph nodes. Thyroid gland, trachea, and esophagus demonstrate no significant findings. Lungs/Pleura: Small right and trace left pleural effusions. Platelike atelectasis in the left lower lobe. Small area of consolidation the base of right lower lobe. No pneumothorax. Few clustered 2-3 mm nodules at the right lung apex are stable. Upper Abdomen: Increased ascites in the upper abdomen with loculations along the liver surface as well as pneumobilia, incompletely visualized. Musculoskeletal: No chest wall abnormality. No acute or significant osseous findings. Review of the MIP images confirms the above findings. IMPRESSION: 1. Motion artifact. No pulmonary embolus identified. 2. Small right and  trace left pleural effusions. 3. Small area of consolidation in the right lower lobe may represent pneumonia or atelectasis. 4. Coronary artery and aortic calcific atherosclerosis. 5. Increased ascites in the upper abdomen with loculations along the liver surface as well as pneumobilia, incompletely visualized. Electronically Signed   By: Kristine Garbe M.D.   On: 02/20/2019 01:14   Dg Chest Port 1 View  Result Date: 02/19/2019 CLINICAL DATA:  Sepsis, vomiting EXAM: PORTABLE CHEST 1 VIEW COMPARISON:  Chest CT 02/01/2019 FINDINGS: Right Port-A-Cath in place with the tip in the SVC. Low lung volumes with bibasilar atelectasis and small effusions, right  greater than left. Heart is normal size. No acute bony abnormality. IMPRESSION: Low lung volumes with bibasilar atelectasis and small effusions, right greater than left. Electronically Signed   By: Rolm Baptise M.D.   On: 02/19/2019 23:35   US Abdomen Limited Ruq  Result Date: 02/20/2019 CLINICAL DATA:  68 y/o F; right upper quadrant abdominal pain with pneumobilia. History of metastatic liver cancer and cholangiocarcinoma. EXAM: ULTRASOUND ABDOMEN LIMITED RIGHT UPPER QUADRANT COMPARISON:  02/06/2019 MRI of the abdomen FINDINGS: Gallbladder: Gallbladder sludge and stones measuring up to 1.9 cm. No gallbladder wall thickening. Negative sonographic Murphy's sign. Common bile duct: Diameter: 5.6 mm.  Common bile duct pneumobilia. Liver: Pneumobilia predominantly in the left lobe. Numerous ill-defined liver masses seen within the right lobe of liver better characterized on the prior MRI of the abdomen. Loculated fluid collections along the surface of the right lobe of liver. Portal vein is patent on color Doppler imaging with normal direction of blood flow towards the liver. IMPRESSION: 1. Intrahepatic and common bile duct pneumobilia. 2. Cholelithiasis and gallbladder sludge. No findings of acute cholecystitis. 3. Ill-defined masses in right lobe of liver better characterized on prior MRI. 4. Loculated fluid collections along the surface of right lobe of liver. Electronically Signed   By: Kristine Garbe M.D.   On: 02/20/2019 04:49    EKG: Orders placed or performed during the hospital encounter of 02/19/19  . ED EKG  . ED EKG    IMPRESSION AND PLAN: Patient 68 year old presenting with generalized weakness  1.  Possible pneumonia will check procalcitonin Treat with IV ceftriaxone azithromycin  2.  Cholangiocarcinoma with mets now with worsening ascites Suspect progression of disease oncology consult they agree we can do paracentesis  3.  Diabetes type 2 we will continue her home  medications plus her sliding scale insulin  4.  Hypothyroidism continue Synthroid  5.  Seizures continue seizure medications  6.  GERD continue omeprazole  7.  Miscellaneous Lovenox for DVT prophylaxis   All the records are reviewed and case discussed with ED provider. Management plans discussed with the patient, family and they are in agreement.  CODE STATUS: Code Status History    Date Active Date Inactive Code Status Order ID Comments User Context   02/01/2019 0957 02/06/2019 1731 Full Code [id number]  Sela Hua, MD Inpatient   02/02/2018 0339 02/04/2018 1924 Full Code [id number]  Harrie Foreman, MD Inpatient       TOTAL TIME TAKING CARE OF THIS PATIENT32mnutes.    SDustin FlockM.D on 02/20/2019 at 7:26 AM  Between 7am to 6pm - Pager - [phone]  After 6pm go to [email or web address] - password [email or web address] [facility]  [phone] CC: Primary care physician; LRemi Haggard FNP

## 2019-02-20 NOTE — ED Notes (Signed)
Pt daughter request to be notified via phone when the pt has a room.

## 2019-02-20 NOTE — ED Notes (Signed)
Pt taken for paracentesis

## 2019-02-20 NOTE — Progress Notes (Signed)
Advanced care plan.  Purpose of the Encounter: CODE STATUS  Parties in Attendance: Patient's daughter [name]  Patient's Decision Capacity: Not intact  Subjective/Patient's story: Patient 68 year old with cholangiocarcinoma with mets presenting with generalized weakness worsening ascites    Objective/Medical story  I discussed with the patient's daughter regarding overall poor prognosis and progression of disease strongly recommended to DO NOT RESUSCITATE status  Goals of care determination:  Patient's daughter is deciding about CODE STATUS   CODE STATUS: Full code for now pending daughter's decision   Time spent discussing advanced care planning: 16 minutes

## 2019-02-20 NOTE — ED Notes (Signed)
Family at bedside updated.  

## 2019-02-20 NOTE — ED Notes (Signed)
Family left at this time. Pt went back to sleep. Will continue to monitor.

## 2019-02-20 NOTE — Consult Note (Signed)
Hematology/Oncology Consult note [facility]  [phone] [phone]  Patient Care Team: Remi Haggard, FNP as PCP - General (Family Medicine)   Name of the patient: Kaylee Wolf  [id number]  Dec 02, 1951   Date of visit: 02/20/2019  Reason for consult: Metastatic cholangiocarcinoma Requesting physician: Dr. 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  She recently had an ERCP with stenting and brushings showed adenocarcinoma.  CA-19-9 was elevated.  She was seen by me as an outpatient on 02/08/2019 and plan was to start first dose of palliative gemcitabine and cisplatin on 02/22/2019 as an outpatient.  She presented to the ER with symptoms of generalized weakness as well as several bouts of nausea and vomiting.  She had a CT chest to rule out PE which did not reveal any PE.Small right and trace left pleural effusion.  Ultrasound of the right upper quadrant revealed Coley lithiasis and gallbladder sludge.  No findings of acute cholecystitis.  Ill-defined masses in the right lobe of liver.  Intrahepatic and common bile duct pneumobilia  Patient feels somewhat improved after 2.8 L fluid was drained. She has had mild nausea and vomiting. She is moving her bowels and passing gas   ECOG PS- 2 Pain scale- 3   Review of systems- Review of Systems  Constitutional: Positive for malaise/fatigue. Negative for chills, fever and weight loss.  HENT: Negative for congestion, ear discharge and nosebleeds.   Eyes: Negative for blurred vision.  Respiratory: Negative for cough, hemoptysis, sputum production, shortness of breath and wheezing.   Cardiovascular: Negative for chest pain, palpitations, orthopnea and claudication.  Gastrointestinal: Positive for abdominal pain, nausea and vomiting. Negative for blood in stool,  constipation, diarrhea, heartburn and melena.  Genitourinary: Negative for dysuria, flank pain, frequency, hematuria and urgency.  Musculoskeletal: Negative for back pain, joint pain and myalgias.  Skin: Negative for rash.  Neurological: Negative for dizziness, tingling, focal weakness, seizures, weakness and headaches.  Endo/Heme/Allergies: Does not bruise/bleed easily.  Psychiatric/Behavioral: Negative for depression and suicidal ideas. The patient does not have insomnia.        No Known Allergies   Past Medical History:  Diagnosis Date  . Anxiety   . Colon polyp 11/29/2012   at [place].  . Insomnia   . Mixed hyperlipidemia   . Recurrent major depressive disorder ([place])   . RLS (restless legs syndrome)   . Seizures ([place])   . Tinea pedis of both feet   . Type 2 diabetes mellitus ([place]) 2014?  [name] Varicose veins      Past Surgical History:  Procedure Laterality Date  . COLONOSCOPY  11/29/2012   Dr [name]  . ERCP N/A 02/01/2019   Procedure: ENDOSCOPIC RETROGRADE CHOLANGIOPANCREATOGRAPHY (ERCP);  Surgeon: [name], MD;  Location[facility] ENDOSCOPY;  Service: Endoscopy;  Laterality: N/A;  . none    . PORTA CATH INSERTION N/A 02/15/2019   Procedure: PORTA CATH INSERTION;  Surgeon: Algernon Huxley, MD;  Location: [place] [facility];  Service: Cardiovascular;  Laterality: N/A;    Social History   Socioeconomic History  . Marital status: Widowed    Spouse name: Not on file  . Number of children: 1  . Years of education: Not on file  . Highest education level: Not on file  Occupational History  . Not on file  Social Needs  . Financial  resource strain: Patient refused  . Food insecurity:    Worry: Patient refused    Inability: Patient refused  . Transportation needs:    Medical: Patient refused    Non-medical: Patient refused  Tobacco Use  . Smoking status: Never Smoker  . Smokeless tobacco: Never Used  Substance and Sexual  Activity  . Alcohol use: No    Alcohol/week: 0.0 standard drinks  . Drug use: No  . Sexual activity: Not Currently  Lifestyle  . Physical activity:    Days per week: Patient refused    Minutes per session: Patient refused  . Stress: Patient refused  Relationships  . Social connections:    Talks on phone: Patient refused    Gets together: Patient refused    Attends religious service: Patient refused    Active member of club or organization: Patient refused    Attends meetings of clubs or organizations: Patient refused    Relationship status: Patient refused  . Intimate partner violence:    Fear of current or ex partner: Patient refused    Emotionally abused: Patient refused    Physically abused: Patient refused    Forced sexual activity: Patient refused  Other Topics Concern  . Not on file  Social History Narrative   Lives with daughter, karen    Family History  Problem Relation Age of Onset  . Diabetes Mother   . Heart disease Mother   . Cancer Father   . Diabetes Brother   . Hypertension Brother   . Stroke Brother   . Hypertension Brother   . Breast cancer Neg Hx      Current Facility-Administered Medications:  .  0.9 %  sodium chloride infusion, 250 mL, Intravenous, PRN, Dustin Flock, MD .  acetaminophen (TYLENOL) tablet 650 mg, 650 mg, Oral, Q6H PRN **OR** acetaminophen (TYLENOL) suppository 650 mg, 650 mg, Rectal, Q6H PRN, Dustin Flock, MD .  aspirin EC tablet 81 mg, 81 mg, Oral, Daily, Dustin Flock, MD, 81 mg at 02/20/19 0263 .  atorvastatin (LIPITOR) tablet 20 mg, 20 mg, Oral, Daily, Dustin Flock, MD, 20 mg at 02/20/19 [phone] .  azithromycin (ZITHROMAX) 500 mg in sodium chloride 0.9 % 250 mL IVPB, 500 mg, Intravenous, Q24H, Dustin Flock, MD, Stopped at 02/20/19 [phone] .  Brexpiprazole TABS 3 mg, 3 mg, Oral, q morning - 10a, Dustin Flock, MD, 3 mg at 02/20/19 1319 .  cefTRIAXone (ROCEPHIN) 1 g in sodium chloride 0.9 % 100 mL IVPB, 1 g, Intravenous,  Q24H, Dustin Flock, MD, Stopped at 02/20/19 [phone]  cholecalciferol (VITAMIN D) tablet 1,000 Units, 1,000 Units, Oral, Daily, Dustin Flock, MD, 1,000 Units at 02/20/19 [phone] .  clonazePAM (KLONOPIN) tablet 0.5 mg, 0.5 mg, Oral, QHS, Dustin Flock, MD .  divalproex (DEPAKOTE) DR tablet 250 mg, 250 mg, Oral, Q12H, Dustin Flock, MD, 250 mg at 02/20/19 [phone] .  enoxaparin (LOVENOX) injection 40 mg, 40 mg, Subcutaneous, Q24H, Dustin Flock, MD, Stopped at 02/20/19 1459 .  escitalopram (LEXAPRO) tablet 20 mg, 20 mg, Oral, Daily, Dustin Flock, MD, 20 mg at 02/20/19 [phone] .  furosemide (LASIX) tablet 40 mg, 40 mg, Oral, Daily, Dustin Flock, MD, 40 mg at 02/20/19 7096 .  gabapentin (NEURONTIN) capsule 400 mg, 400 mg, Oral, BID, Dustin Flock, MD, 400 mg at 02/20/19 1319 .  insulin aspart (novoLOG) injection 0-9 Units, 0-9 Units, Subcutaneous, TID WC, Dustin Flock, MD .  lamoTRIgine (LAMICTAL) tablet 150 mg, 150 mg, Oral, BID, Dustin Flock, MD, 150 mg at 02/20/19 0953 .  levothyroxine (SYNTHROID, LEVOTHROID) tablet 50 mcg, 50 mcg, Oral, QAC breakfast, Dustin Flock, MD, 50 mcg at 02/20/19 [phone] .  metFORMIN (GLUCOPHAGE) tablet 500 mg, 500 mg, Oral, BID WC, Dustin Flock, MD, 500 mg at 02/20/19 1319 .  multivitamin with minerals tablet 1 tablet, 1 tablet, Oral, Daily, Dustin Flock, MD, 1 tablet at 02/20/19 [phone] .  ondansetron (ZOFRAN) tablet 4 mg, 4 mg, Oral, Q6H PRN **OR** ondansetron (ZOFRAN) injection 4 mg, 4 mg, Intravenous, Q6H PRN, Dustin Flock, MD .  oxyCODONE (Oxy IR/ROXICODONE) immediate release tablet 5 mg, 5 mg, Oral, Q6H PRN, Dustin Flock, MD .  pantoprazole (PROTONIX) EC tablet 40 mg, 40 mg, Oral, Daily, Dustin Flock, MD, 40 mg at 02/20/19 6389 .  phenytoin (DILANTIN) ER capsule 100 mg, 100 mg, Oral, BID, Dustin Flock, MD, 100 mg at 02/20/19 0953 .  polyethylene glycol (MIRALAX / GLYCOLAX) packet 17 g, 17 g, Oral, Daily PRN, Dustin Flock, MD .  sodium  chloride 0.9 % bolus 1,000 mL, 1,000 mL, Intravenous, Once, Nena Polio, MD, Stopped at 02/19/19 2323 .  sodium chloride flush (NS) 0.9 % injection 3 mL, 3 mL, Intravenous, Once, Nena Polio, MD .  sodium chloride flush (NS) 0.9 % injection 3 mL, 3 mL, Intravenous, Q12H, Dustin Flock, MD, Stopped at 02/20/19 1500 .  sodium chloride flush (NS) 0.9 % injection 3 mL, 3 mL, Intravenous, PRN, Dustin Flock, MD  Current Outpatient Medications:  .  aspirin EC 81 MG EC tablet, Take 1 tablet (81 mg total) by mouth daily., Disp: 30 tablet, Rfl: 0 .  Cholecalciferol (VITAMIN D-3) 1000 UNITS CAPS, Take 1 capsule by mouth daily. , Disp: , Rfl:  .  divalproex (DEPAKOTE) 125 MG DR tablet, Take 1 pill in am and 2 pill at night, Disp: 90 tablet, Rfl: 1 .  escitalopram (LEXAPRO) 20 MG tablet, Take 1 tablet (20 mg total) by mouth daily., Disp: 30 tablet, Rfl: 11 .  furosemide (LASIX) 40 MG tablet, Take 40 mg by mouth daily. , Disp: , Rfl:  .  gabapentin (NEURONTIN) 400 MG capsule, Take 1 capsule (400 mg total) by mouth 2 (two) times daily., Disp: 180 capsule, Rfl: 3 .  lamoTRIgine (LAMICTAL) 150 MG tablet, Take 150 mg by mouth 2 (two) times daily. , Disp: , Rfl:  .  levothyroxine (SYNTHROID, LEVOTHROID) 50 MCG tablet, Take 50 mcg by mouth daily before breakfast. , Disp: , Rfl:  .  metFORMIN (GLUCOPHAGE) 500 MG tablet, Take 1 tablet (500 mg total) by mouth 2 (two) times daily., Disp: 180 tablet, Rfl: 3 .  Multiple Vitamin (MULTIVITAMIN) tablet, Take 1 tablet by mouth daily., Disp: , Rfl:  .  omeprazole (PRILOSEC) 20 MG capsule, Take 1 capsule (20 mg total) by mouth daily. Before 30 min before first meal of day, Disp: 30 capsule, Rfl: 5 .  oxyCODONE (OXY IR/ROXICODONE) 5 MG immediate release tablet, Take 1 tablet (5 mg total) by mouth every 6 (six) hours as needed for moderate pain., Disp: 20 tablet, Rfl: 0 .  phenytoin (DILANTIN) 200 MG ER capsule, Take 100 mg by mouth 2 (two) times daily. , Disp: , Rfl:    .  potassium chloride (K-DUR,KLOR-CON) 10 MEQ tablet, Take 10 mEq by mouth once. , Disp: , Rfl:  .  acetaminophen (TYLENOL) 325 MG tablet, Take 2 tablets (650 mg total) by mouth every 8 (eight) hours as needed for mild pain (or Fever >/= 101)., Disp: , Rfl:  .  atorvastatin (LIPITOR) 20 MG tablet, Take  1 tablet (20 mg total) by mouth daily., Disp: 90 tablet, Rfl: 3 .  blood glucose meter kit and supplies KIT, Dispense based on patient and insurance preference. Check blood glucose once daily., Disp: 1 each, Rfl: 0 .  clonazePAM (KLONOPIN) 0.5 MG tablet, Take 0.5 mg by mouth at bedtime., Disp: , Rfl:  .  dexamethasone (DECADRON) 4 MG tablet, Take 2 tablets by mouth once a day on the day after chemotherapy and then take 2 tablets two times a day for 2 days. Take with food., Disp: 30 tablet, Rfl: 1 .  lidocaine-prilocaine (EMLA) cream, Apply to affected area once, Disp: 30 g, Rfl: 3 .  linaclotide (LINZESS) 72 MCG capsule, Take 72 mcg by mouth as needed., Disp: , Rfl:  .  ondansetron (ZOFRAN) 8 MG tablet, Take 1 tablet (8 mg total) by mouth 2 (two) times daily as needed. Start on the third day after chemotherapy., Disp: 30 tablet, Rfl: 1 .  ondansetron (ZOFRAN-ODT) 4 MG disintegrating tablet, Take 1 tablet (4 mg total) by mouth 2 (two) times daily., Disp: 20 tablet, Rfl: 1 .  polyethylene glycol (MIRALAX / GLYCOLAX) packet, Take 17 g by mouth daily as needed for mild constipation., Disp: 14 each, Rfl: 0 .  prochlorperazine (COMPAZINE) 10 MG tablet, Take 1 tablet (10 mg total) by mouth every 6 (six) hours as needed (Nausea or vomiting)., Disp: 30 tablet, Rfl: 1 .  REXULTI 3 MG TABS, 3 mg every morning. , Disp: , Rfl:   Physical exam:  Vitals:   02/20/19 0930 02/20/19 1005 02/20/19 1048 02/20/19 1549  BP: 129/66 116/63 (!) 91/59 127/73  Pulse: 99 (!) 105 (!) 102 (!) 115  Resp:    18  Temp:      TempSrc:      SpO2: 94% 94% 95% 94%  Weight:      Height:       Physical Exam HENT:     Head:  Normocephalic and atraumatic.  Eyes:     Pupils: Pupils are equal, round, and reactive to light.  Neck:     Musculoskeletal: Normal range of motion.  Cardiovascular:     Rate and Rhythm: Regular rhythm. Tachycardia present.     Heart sounds: Normal heart sounds.  Pulmonary:     Effort: Pulmonary effort is normal.     Breath sounds: Normal breath sounds.  Abdominal:     General: Bowel sounds are normal. There is distension.     Tenderness: There is no abdominal tenderness.     Comments: firm  Skin:    General: Skin is warm and dry.  Neurological:     Mental Status: She is alert and oriented to person, place, and time.      CMP Latest Ref Rng & Units 02/19/2019  Glucose 70 - 99 mg/dL 164(H)  BUN 8 - 23 mg/dL 12  Creatinine 0.44 - 1.00 mg/dL 0.59  Sodium 135 - 145 mmol/L 136  Potassium 3.5 - 5.1 mmol/L 4.1  Chloride 98 - 111 mmol/L 97(L)  CO2 22 - 32 mmol/L 30  Calcium 8.9 - 10.3 mg/dL 8.2(L)  Total Protein 6.5 - 8.1 g/dL 6.1(L)  Total Bilirubin 0.3 - 1.2 mg/dL 0.8  Alkaline Phos 38 - 126 U/L 264(H)  AST 15 - 41 U/L 25  ALT 0 - 44 U/L 15   CBC Latest Ref Rng & Units 02/19/2019  WBC 4.0 - 10.5 K/uL 8.0  Hemoglobin 12.0 - 15.0 g/dL 10.9(L)  Hematocrit 36.0 - 46.0 % 35.5(L)  Platelets 150 - 400 K/uL 609(H)    '@IMAGES' @  Ct Chest W Contrast  Result Date: 02/01/2019 CLINICAL DATA:  Cough with persistent shortness of breath. EXAM: CT CHEST, ABDOMEN, AND PELVIS WITH CONTRAST TECHNIQUE: Multidetector CT imaging of the chest, abdomen and pelvis was performed following the standard protocol during bolus administration of intravenous contrast. CONTRAST:  133m ISOVUE-300 IOPAMIDOL (ISOVUE-300) INJECTION 61% COMPARISON:  Right upper quadrant ultrasound earlier today FINDINGS: CT CHEST FINDINGS Cardiovascular: Normal heart size. No pericardial effusion. Coronary atherosclerosis. Mild aortic atherosclerosis. Mediastinum/Nodes: Negative for adenopathy. Lungs/Pleura: Mild dependent  atelectasis. No consolidation or nodularity Musculoskeletal: No acute or aggressive finding. Generalized spondylosis. CT ABDOMEN PELVIS FINDINGS Hepatobiliary: 4 or 5 indistinct liver masses centered on the right, measuring up to 2.3 cm. There is cholelithiasis and gallbladder wall thickening with mild fat indistinctness. Murphy sign was negative on prior ultrasound. There is intrahepatic biliary duct dilatation above a circumferentially thickened common bile duct. Small subcapsular fluid collection along the right liver without serosal enhancement, favor loculated peritoneal fluid. Pancreas: No evident mass or inflammation Spleen: Eggshell calcification at the hilum measuring 2.3 cm, compatible with remote insult. Adrenals/Urinary Tract: Negative adrenals. No hydronephrosis or stone. Negative bladder. Stomach/Bowel: No evident obstruction, inflammation, or mass. Vascular/Lymphatic: Prominent but not strictly enlarged lymph nodes in the deep liver drainage. Reproductive: Negative Other: Small volume ascites. Nodular appearance of the omentum including omentum that is herniated through a fatty umbilical hernia. Musculoskeletal: No acute or aggressive finding. Advanced lumbar facet degeneration. IMPRESSION: 1. 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 Please correlate for cholangitis symptoms. 2. Indistinct right liver masses, favor metastatic disease over early biliary abscesses. 3. Nodular omentum most consistent with peritoneal carcinomatosis. 4. Cholelithiasis. 5. No acute or malignant finding in the chest. Electronically Signed   By: JMonte FantasiaM.D.   On: 02/01/2019 06:34   Ct Angio Chest Pe W And/or Wo Contrast  Result Date: 02/20/2019 CLINICAL DATA:  68y/o F; PE suspected, intermediate prob, positive D-dimer. EXAM: CT ANGIOGRAPHY CHEST WITH CONTRAST TECHNIQUE: Multidetector CT imaging of  the chest was performed using the standard protocol during bolus administration of intravenous contrast. Multiplanar CT image reconstructions and MIPs were obtained to evaluate the vascular anatomy. CONTRAST:  One hundred thirty-five OMNIPAQUE IOHEXOL 350 MG/ML SOLN COMPARISON:  02/01/2019 CT chest, abdomen pelvis. FINDINGS: Cardiovascular: Multiple attempted acquisitions. Motion artifact. Satisfactory opacification of the pulmonary arteries to the segmental level. No pulmonary embolus identified. Satisfactory opacification of the thoracic aorta no aortic dissection or aneurysm. Mild cardiomegaly heart size. No pericardial effusion. Right port catheter tip projects over lower SVC. Coronary artery and aortic calcific atherosclerosis. Mediastinum/Nodes: No enlarged mediastinal, hilar, or axillary lymph nodes. Thyroid gland, trachea, and esophagus demonstrate no significant findings. Lungs/Pleura: Small right and trace left pleural effusions. Platelike atelectasis in the left lower lobe. Small area of consolidation the base of right lower lobe. No pneumothorax. Few clustered 2-3 mm nodules at the right lung apex are stable. Upper Abdomen: Increased ascites in the upper abdomen with loculations along the liver surface as well as pneumobilia, incompletely visualized. Musculoskeletal: No chest wall abnormality. No acute or significant osseous findings. Review of the MIP images confirms the above findings. IMPRESSION: 1. Motion artifact. No pulmonary embolus identified. 2. Small right and trace left pleural effusions. 3. Small area of consolidation in the right lower lobe may represent pneumonia or atelectasis. 4. Coronary artery and aortic calcific atherosclerosis. 5. Increased  ascites in the upper abdomen with loculations along the liver surface as well as pneumobilia, incompletely visualized. Electronically Signed   By: Kristine Garbe M.D.   On: 02/20/2019 01:14   Ct Abdomen Pelvis W Contrast  Result Date:  02/01/2019 CLINICAL DATA:  Cough with persistent shortness of breath. EXAM: CT CHEST, ABDOMEN, AND PELVIS WITH CONTRAST TECHNIQUE: Multidetector CT imaging of the chest, abdomen and pelvis was performed following the standard protocol during bolus administration of intravenous contrast. CONTRAST:  187m ISOVUE-300 IOPAMIDOL (ISOVUE-300) INJECTION 61% COMPARISON:  Right upper quadrant ultrasound earlier today FINDINGS: CT CHEST FINDINGS Cardiovascular: Normal heart size. No pericardial effusion. Coronary atherosclerosis. Mild aortic atherosclerosis. Mediastinum/Nodes: Negative for adenopathy. Lungs/Pleura: Mild dependent atelectasis. No consolidation or nodularity Musculoskeletal: No acute or aggressive finding. Generalized spondylosis. CT ABDOMEN PELVIS FINDINGS Hepatobiliary: 4 or 5 indistinct liver masses centered on the right, measuring up to 2.3 cm. There is cholelithiasis and gallbladder wall thickening with mild fat indistinctness. Murphy sign was negative on prior ultrasound. There is intrahepatic biliary duct dilatation above a circumferentially thickened common bile duct. Small subcapsular fluid collection along the right liver without serosal enhancement, favor loculated peritoneal fluid. Pancreas: No evident mass or inflammation Spleen: Eggshell calcification at the hilum measuring 2.3 cm, compatible with remote insult. Adrenals/Urinary Tract: Negative adrenals. No hydronephrosis or stone. Negative bladder. Stomach/Bowel: No evident obstruction, inflammation, or mass. Vascular/Lymphatic: Prominent but not strictly enlarged lymph nodes in the deep liver drainage. Reproductive: Negative Other: Small volume ascites. Nodular appearance of the omentum including omentum that is herniated through a fatty umbilical hernia. Musculoskeletal: No acute or aggressive finding. Advanced lumbar facet degeneration. IMPRESSION: 1. Intrahepatic biliary dilatation of above a thickened enhancing common bile duct primarily  concerning for cholangiocarcinoma given constellation of findings. Please correlate for cholangitis symptoms. 2. Indistinct right liver masses, favor metastatic disease over early biliary abscesses. 3. Nodular omentum most consistent with peritoneal carcinomatosis. 4. Cholelithiasis. 5. No acute or malignant finding in the chest. Electronically Signed   By: JMonte FantasiaM.D.   On: 02/01/2019 06:34   Mr Liver W Wo Contrast  Result Date: 02/06/2019 CLINICAL DATA:  68year old female with history of abnormal CT scan concerning for potential cholangiocarcinoma. Follow-up study. EXAM: MRI ABDOMEN WITHOUT AND WITH CONTRAST TECHNIQUE: Multiplanar multisequence MR imaging of the abdomen was performed both before and after the administration of intravenous contrast. CONTRAST:  8 mL of Gadavist. COMPARISON:  No prior abdominal MRI. CT the chest, abdomen and pelvis 02/01/2019. FINDINGS: Comment: Today's study is limited by considerable patient respiratory motion. Lower chest: T2 signal intensity dependently in the lower right hemithorax, compatible with a small to moderate right pleural effusion, likely with some associated passive atelectasis in the right lower lobe. Hepatobiliary: There are multiple hepatic lesions predominantly throughout the right lobe of the liver. The largest of these is in segment 6 (axial image 26 of series 5) measuring 2.4 x 3.7 cm. These lesions are all T1 hypointense, heterogeneously T2 hyperintense, demonstrate diffusion restriction, and are hypovascular with peripheral enhancement on post gadolinium imaging, highly concerning for metastatic lesions. MRCP imaging was not performed. However, the previously noted intrahepatic biliary ductal dilatation evident on CT examination 02/01/2019 is no longer noted on today's study. Mild T2 hyperintensity in the periportal aspects of the hepatic parenchyma, suggesting periportal edema. There is a subtle signal void in the common bile duct, likely  reflective of an indwelling common bile duct stent. Some dependent T2 hypointensity and multiple well-defined rounded T2 hypo intensities are noted  within the gallbladder, compatible with a combination of biliary sludge and gallstones. Gallbladder does not appear distended. Gallbladder wall does not appear thickened. Pancreas: No pancreatic mass. No pancreatic ductal dilatation. No pancreatic or peripancreatic fluid or inflammatory changes. Spleen: Well-defined T1 hypointense, T2 hyperintense, nonenhancing lesion in the anterior aspect of the spleen, corresponding to rim calcified structure on recent CT examination, likely sequela of remote splenic trauma. Spleen is otherwise unremarkable in appearance. Adrenals/Urinary Tract: Bilateral kidneys and adrenal glands are normal in appearance. No hydroureteronephrosis in the visualized portions of the abdomen. Stomach/Bowel: Visualized portions are unremarkable. Vascular/Lymphatic: No aneurysm identified in the visualized abdominal vasculature. Mildly enlarged portacaval lymph node measuring up to 1.6 cm in short axis (axial image forty-nine of series 13). Other:  Small volume of ascites most evident inferior to the liver. Musculoskeletal: No aggressive appearing osseous lesions are noted in the visualized portions of the skeleton. IMPRESSION: 1. Multiple malignant appearing lesions throughout the right lobe of the liver, most likely to reflect metastatic lesions. This is associated with some portacaval lymphadenopathy. 2. Previously noted intrahepatic biliary ductal dilatation has resolved following placement of common bile duct stent and sphincterotomy. 3. Cholelithiasis and biliary sludge in the gallbladder. No findings to suggest an acute cholecystitis at this time. 4. Trace volume of ascites. Electronically Signed   By: Vinnie Langton M.D.   On: 02/06/2019 06:42   US Paracentesis  Result Date: 02/20/2019 INDICATION: Cholangiocarcinoma. Abdominal distention and  ascites. Request for diagnostic and therapeutic paracentesis. EXAM: ULTRASOUND GUIDED LEFT LOWER QUADRANT PARACENTESIS MEDICATIONS: None. COMPLICATIONS: None immediate. PROCEDURE: Informed written consent was obtained from the patient after a discussion of the risks, benefits and alternatives to treatment. A timeout was performed prior to the initiation of the procedure. Initial ultrasound scanning demonstrates a moderate amount of ascites within the left lower abdominal quadrant. The left lower abdomen was prepped and draped in the usual sterile fashion. 1% lidocaine with epinephrine was used for local anesthesia. Following this, a 6 Fr Safe-T-Centesis catheter was introduced. An ultrasound image was saved for documentation purposes. The paracentesis was performed. The catheter was removed and a dressing was applied. The patient tolerated the procedure well without immediate post procedural complication. FINDINGS: A total of approximately 2.8 L of clear yellow fluid was removed. Samples were sent to the laboratory as requested by the clinical team. IMPRESSION: Successful ultrasound-guided paracentesis yielding 2.8 liters of peritoneal fluid. Read by: Ascencion Dike PA-C Electronically Signed   By: Sandi Mariscal M.D.   On: 02/20/2019 11:17   Dg Chest Port 1 View  Result Date: 02/19/2019 CLINICAL DATA:  Sepsis, vomiting EXAM: PORTABLE CHEST 1 VIEW COMPARISON:  Chest CT 02/01/2019 FINDINGS: Right Port-A-Cath in place with the tip in the SVC. Low lung volumes with bibasilar atelectasis and small effusions, right greater than left. Heart is normal size. No acute bony abnormality. IMPRESSION: Low lung volumes with bibasilar atelectasis and small effusions, right greater than left. Electronically Signed   By: Rolm Baptise M.D.   On: 02/19/2019 23:35   Dg C-arm 1-60 Min-no Report  Result Date: 02/01/2019 Fluoroscopy was utilized by the requesting physician.  No radiographic interpretation.   US Abdomen Limited  Ruq  Result Date: 02/20/2019 CLINICAL DATA:  68 y/o F; right upper quadrant abdominal pain with pneumobilia. History of metastatic liver cancer and cholangiocarcinoma. EXAM: ULTRASOUND ABDOMEN LIMITED RIGHT UPPER QUADRANT COMPARISON:  02/06/2019 MRI of the abdomen FINDINGS: Gallbladder: Gallbladder sludge and stones measuring up to 1.9 cm. No gallbladder wall thickening.  Negative sonographic Murphy's sign. Common bile duct: Diameter: 5.6 mm.  Common bile duct pneumobilia. Liver: Pneumobilia predominantly in the left lobe. Numerous ill-defined liver masses seen within the right lobe of liver better characterized on the prior MRI of the abdomen. Loculated fluid collections along the surface of the right lobe of liver. Portal vein is patent on color Doppler imaging with normal direction of blood flow towards the liver. IMPRESSION: 1. Intrahepatic and common bile duct pneumobilia. 2. Cholelithiasis and gallbladder sludge. No findings of acute cholecystitis. 3. Ill-defined masses in right lobe of liver better characterized on prior MRI. 4. Loculated fluid collections along the surface of right lobe of liver. Electronically Signed   By: Kristine Garbe M.D.   On: 02/20/2019 04:49   US Abdomen Limited Ruq  Result Date: 02/01/2019 CLINICAL DATA:  Episodic right upper quadrant pain EXAM: ULTRASOUND ABDOMEN LIMITED RIGHT UPPER QUADRANT COMPARISON:  None. FINDINGS: Gallbladder: Gallbladder sludge and multiple shadowing stones, including a stone at the neck. Gallbladder wall is mildly thickened to 5 mm. No pericholecystic edema. Negative sonographic Murphy sign. Common bile duct: Diameter: 8 mm. Where visualized, no filling defect. There is intrahepatic bile duct dilatation Liver: There are 3 solid appearing liver masses seen in the right liver measuring up to 5.8 cm. Portal vein is patent on color Doppler imaging with normal direction of blood flow towards the liver. IMPRESSION: 1. At least 3 liver masses  primarily concerning for metastatic disease. Recommend enhanced abdomen and pelvis CT. 2. Cholelithiasis and sludge including stone at the gallbladder neck or cystic duct. The incompletely visualized common bile duct is also dilated and there is intrahepatic biliary dilatation. There could be obstructing stone or mass, attention on follow-up CT. 3. No suspected cholecystitis. Electronically Signed   By: Monte Fantasia M.D.   On: 02/01/2019 05:48     Assessment and plan- Patient is a 68 y.o. 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  Metastatic cholangiocarcinoma: She underwent ultrasound-guided paracentesis and 2.8 L of fluid was taken out.  Anticipate to start chemotherapy after discharge from the hospital  2.  Normocytic anemia and thrombocytosis: Likely secondary to malignancy.  Continue to monitor  3.  Patient has a history of seizure disorder and is on valproic acid and levels are subtherapeutic and may need adjustment.  4.  Possible healthcare associated pneumonia: She is currently on ceftriaxone and azithromycin.  Blood cultures are negative so far   Visit Diagnosis 1. Hypoxia   2. Sepsis ([place])   3. Community acquired pneumonia, unspecified laterality   4. RUQ pain   5. Abdominal distension      Dr. Randa Evens, MD, MPH [facility] at [facility] [phone] 02/20/2019 4:15 PM

## 2019-02-20 NOTE — ED Notes (Signed)
Pt asleep, equal rise and fall of chest noted. Lights dimmed. Side rails raised. Will provide medications when pt wakes up.

## 2019-02-21 ENCOUNTER — Other Ambulatory Visit: Payer: Self-pay | Admitting: Oncology

## 2019-02-21 ENCOUNTER — Ambulatory Visit: Payer: Medicare HMO | Admitting: Gastroenterology

## 2019-02-21 ENCOUNTER — Other Ambulatory Visit: Payer: Self-pay | Admitting: *Deleted

## 2019-02-21 LAB — COMPREHENSIVE METABOLIC PANEL
ALBUMIN: 1.7 g/dL — AB (ref 3.5–5.0)
ALT: 12 U/L (ref 0–44)
AST: 28 U/L (ref 15–41)
Alkaline Phosphatase: 192 U/L — ABNORMAL HIGH (ref 38–126)
Anion gap: 8 (ref 5–15)
BUN: 10 mg/dL (ref 8–23)
CO2: 32 mmol/L (ref 22–32)
Calcium: 8 mg/dL — ABNORMAL LOW (ref 8.9–10.3)
Chloride: 99 mmol/L (ref 98–111)
Creatinine, Ser: 0.53 mg/dL (ref 0.44–1.00)
GFR calc Af Amer: >60 mL/min (ref >60)
GFR calc non Af Amer: >60 mL/min (ref >60)
Glucose, Bld: 143 mg/dL — ABNORMAL HIGH (ref 70–99)
Potassium: 3.9 mmol/L (ref 3.5–5.1)
Sodium: 139 mmol/L (ref 135–145)
Total Bilirubin: 0.8 mg/dL (ref 0.3–1.2)
Total Protein: 5.3 g/dL — ABNORMAL LOW (ref 6.5–8.1)

## 2019-02-21 LAB — CBC
HCT: 32.9 % — ABNORMAL LOW (ref 36.0–46.0)
Hemoglobin: 9.9 g/dL — ABNORMAL LOW (ref 12.0–15.0)
MCH: 26.2 pg (ref 26.0–34.0)
MCHC: 30.1 g/dL (ref 30.0–36.0)
MCV: 87 fL (ref 80.0–100.0)
Platelets: 505 10*3/uL — ABNORMAL HIGH (ref 150–400)
RBC: 3.78 MIL/uL — ABNORMAL LOW (ref 3.87–5.11)
RDW: 16.3 % — ABNORMAL HIGH (ref 11.5–15.5)
WBC: 6.9 10*3/uL (ref 4.0–10.5)
nRBC: 0 % (ref 0.0–0.2)

## 2019-02-21 LAB — URINE DRUG SCREEN, QUALITATIVE (ARMC ONLY)
Amphetamines, Ur Screen: NOT DETECTED
BARBITURATES, UR SCREEN: NOT DETECTED
Benzodiazepine, Ur Scrn: NOT DETECTED
CANNABINOID 50 NG, UR ~~LOC~~: NOT DETECTED
COCAINE METABOLITE, UR ~~LOC~~: NOT DETECTED
MDMA (Ecstasy)Ur Screen: NOT DETECTED
Methadone Scn, Ur: NOT DETECTED
Opiate, Ur Screen: NOT DETECTED
Phencyclidine (PCP) Ur S: NOT DETECTED
TRICYCLIC, UR SCREEN: NOT DETECTED

## 2019-02-21 LAB — GLUCOSE, CAPILLARY
GLUCOSE-CAPILLARY: 117 mg/dL — AB (ref 70–99)
Glucose-Capillary: 104 mg/dL — ABNORMAL HIGH (ref 70–99)
Glucose-Capillary: 121 mg/dL — ABNORMAL HIGH (ref 70–99)
Glucose-Capillary: 131 mg/dL — ABNORMAL HIGH (ref 70–99)
Glucose-Capillary: 161 mg/dL — ABNORMAL HIGH (ref 70–99)

## 2019-02-21 LAB — PATHOLOGIST SMEAR REVIEW

## 2019-02-21 MED ORDER — BREXPIPRAZOLE 1 MG PO TABS
3.0000 mg | ORAL_TABLET | Freq: Every morning | ORAL | Status: DC
  Administered 2019-02-21 – 2019-02-23 (×3): 3 mg via ORAL
  Filled 2019-02-21 (×4): qty 3

## 2019-02-21 NOTE — Progress Notes (Signed)
[facility]                                                                                                                                                                                  Patient Demographics   Kaylee Wolf, is a 68 y.o. female, DOB - 1951/01/07, BHA:[id number]  Admit date - 02/19/2019   Admitting Physician Dustin Flock, MD  Outpatient Primary MD for the patient is Remi Haggard, FNP   LOS - 1  Subjective: Pt still sob , patient's feeling better    Review of Systems:   CONSTITUTIONAL: No documented fever. No fatigue, weakness. No weight gain, no weight loss.  EYES: No blurry or double vision.  ENT: No tinnitus. No postnasal drip. No redness of the oropharynx.  RESPIRATORY: No cough, no wheeze, no hemoptysis.  Positive dyspnea.  CARDIOVASCULAR: No chest pain. No orthopnea. No palpitations. No syncope.  GASTROINTESTINAL: No nausea, no vomiting or diarrhea. No abdominal pain. No melena or hematochezia.  GENITOURINARY: No dysuria or hematuria.  ENDOCRINE: No polyuria or nocturia. No heat or cold intolerance.  HEMATOLOGY: No anemia. No bruising. No bleeding.  INTEGUMENTARY: No rashes. No lesions.  MUSCULOSKELETAL: No arthritis. No swelling. No gout.  NEUROLOGIC: No numbness, tingling, or ataxia. No seizure-type activity.  PSYCHIATRIC: No anxiety. No insomnia. No ADD.    Vitals:   Vitals:   02/20/19 1549 02/20/19 1618 02/20/19 2032 02/21/19 0508  BP: 127/73 119/67 107/65 121/69  Pulse: (!) 115 (!) 114 (!) 113 (!) 101  Resp: 18 18 20 20   Temp:  98.6 F (37 C) 99.5 F (37.5 C) 98.5 F (36.9 C)  TempSrc:  Oral    SpO2: 94% 94% 96% 96%  Weight:      Height:        Wt Readings from Last 3 Encounters:  02/19/19 89.7 kg  02/15/19 89.7 kg  02/08/19 89.7 kg     Intake/Output Summary (Last 24 hours) at 02/21/2019 1407 Last data filed at 02/21/2019 1300 Gross per 24 hour  Intake 540.57 ml  Output 350 ml  Net  190.57 ml    Physical Exam:   GENERAL: Pleasant-appearing in no apparent distress.  HEAD, EYES, EARS, NOSE AND THROAT: Atraumatic, normocephalic. Extraocular muscles are intact. Pupils equal and reactive to light. Sclerae anicteric. No conjunctival injection. No oro-pharyngeal erythema.  NECK: Supple. There is no jugular venous distention. No bruits, no lymphadenopathy, no thyromegaly.  HEART: Regular rate and rhythm,. No murmurs, no rubs, no clicks.  LUNGS: Clear to auscultation bilaterally. No rales or rhonchi. No wheezes.  ABDOMEN: Soft, flat, nontender, nondistended. Has good bowel sounds. No hepatosplenomegaly appreciated.  EXTREMITIES:  No evidence of any cyanosis, clubbing, or peripheral edema.  +2 pedal and radial pulses bilaterally.  NEUROLOGIC: The patient is alert, awake, and oriented x3 with no focal motor or sensory deficits appreciated bilaterally.  SKIN: Moist and warm with no rashes appreciated.  Psych: Not anxious, depressed LN: No inguinal LN enlargement    Antibiotics   Anti-infectives (From admission, onward)   Start     Dose/Rate Route Frequency Ordered Stop   02/20/19 0900  azithromycin (ZITHROMAX) 500 mg in sodium chloride 0.9 % 250 mL IVPB     500 mg 250 mL/hr over 60 Minutes Intravenous Every 24 hours 02/20/19 0750     02/20/19 0800  cefTRIAXone (ROCEPHIN) 1 g in sodium chloride 0.9 % 100 mL IVPB     1 g 200 mL/hr over 30 Minutes Intravenous Every 24 hours 02/20/19 0750     02/20/19 0230  ceFEPIme (MAXIPIME) 1 g in sodium chloride 0.9 % 100 mL IVPB     1 g 200 mL/hr over 30 Minutes Intravenous  Once 02/20/19 0218 02/20/19 0334      Medications   Scheduled Meds: . aspirin EC  81 mg Oral Daily  . atorvastatin  20 mg Oral Daily  . Brexpiprazole  3 mg Oral q morning - 10a  . cholecalciferol  1,000 Units Oral Daily  . clonazePAM  0.5 mg Oral QHS  . divalproex  250 mg Oral Q12H  . enoxaparin (LOVENOX) injection  40 mg Subcutaneous Q24H  . escitalopram  20  mg Oral Daily  . furosemide  40 mg Oral Daily  . gabapentin  400 mg Oral BID  . insulin aspart  0-9 Units Subcutaneous TID WC  . lamoTRIgine  150 mg Oral BID  . levothyroxine  50 mcg Oral QAC breakfast  . metFORMIN  500 mg Oral BID WC  . multivitamin with minerals  1 tablet Oral Daily  . pantoprazole  40 mg Oral Daily  . phenytoin  100 mg Oral BID  . sodium chloride flush  3 mL Intravenous Once  . sodium chloride flush  3 mL Intravenous Q12H   Continuous Infusions: . sodium chloride    . azithromycin 500 mg (02/21/19 1240)  . cefTRIAXone (ROCEPHIN)  IV 1 g (02/21/19 1031)  . sodium chloride Stopped (02/19/19 2323)   PRN Meds:.sodium chloride, acetaminophen **OR** acetaminophen, ondansetron **OR** ondansetron (ZOFRAN) IV, oxyCODONE, polyethylene glycol, sodium chloride flush   Data Review:   Micro Results Recent Results (from the past 240 hour(s))  Culture, blood (Routine x 2)     Status: None (Preliminary result)   Collection Time: 02/19/19 10:56 PM  Result Value Ref Range Status   Specimen Description BLOOD LEFT WRIST  Final   Special Requests   Final    BOTTLES DRAWN AEROBIC AND ANAEROBIC Blood Culture results may not be optimal due to an excessive volume of blood received in culture bottles   Culture   Final    NO GROWTH 2 DAYS Performed at [facility], [address], [place], [place] [address]    Report Status PENDING  Incomplete  Culture, blood (Routine x 2)     Status: None (Preliminary result)   Collection Time: 02/19/19 10:56 PM  Result Value Ref Range Status   Specimen Description BLOOD RIGHT WRIST  Final   Special Requests   Final    BOTTLES DRAWN AEROBIC AND ANAEROBIC Blood Culture results may not be optimal due to an excessive volume of blood received in culture bottles  Culture   Final    NO GROWTH 2 DAYS Performed at [facility], [address] [place], [place] [address]    Report Status PENDING  Incomplete  Body fluid culture      Status: None (Preliminary result)   Collection Time: 02/20/19 10:25 AM  Result Value Ref Range Status   Specimen Description   Final    PERITONEAL Performed at [facility], [address], [place], [place] [address]    Special Requests   Final    PERITONEAL Performed at [facility], [address] [place], [place] [address]    Gram Stain   Final    RARE WBC PRESENT, PREDOMINANTLY PMN NO ORGANISMS SEEN    Culture   Final    NO GROWTH < 24 HOURS Performed at [facility], [place] [address] [place], [place] [address]    Report Status PENDING  Incomplete    Radiology Reports Ct Chest W Contrast  Result Date: 02/01/2019 CLINICAL DATA:  Cough with persistent shortness of breath. EXAM: CT CHEST, ABDOMEN, AND PELVIS WITH CONTRAST TECHNIQUE: Multidetector CT imaging of the chest, abdomen and pelvis was performed following the standard protocol during bolus administration of intravenous contrast. CONTRAST:  172mL ISOVUE-300 IOPAMIDOL (ISOVUE-300) INJECTION 61% COMPARISON:  Right upper quadrant ultrasound earlier today FINDINGS: CT CHEST FINDINGS Cardiovascular: Normal heart size. No pericardial effusion. Coronary atherosclerosis. Mild aortic atherosclerosis. Mediastinum/Nodes: Negative for adenopathy. Lungs/Pleura: Mild dependent atelectasis. No consolidation or nodularity Musculoskeletal: No acute or aggressive finding. Generalized spondylosis. CT ABDOMEN PELVIS FINDINGS Hepatobiliary: 4 or 5 indistinct liver masses centered on the right, measuring up to 2.3 cm. There is cholelithiasis and gallbladder wall thickening with mild fat indistinctness. Murphy sign was negative on prior ultrasound. There is intrahepatic biliary duct dilatation above a circumferentially thickened common bile duct. Small subcapsular fluid collection along the right liver without serosal enhancement, favor loculated peritoneal fluid. Pancreas: No evident mass or inflammation Spleen: Eggshell  calcification at the hilum measuring 2.3 cm, compatible with remote insult. Adrenals/Urinary Tract: Negative adrenals. No hydronephrosis or stone. Negative bladder. Stomach/Bowel: No evident obstruction, inflammation, or mass. Vascular/Lymphatic: Prominent but not strictly enlarged lymph nodes in the deep liver drainage. Reproductive: Negative Other: Small volume ascites. Nodular appearance of the omentum including omentum that is herniated through a fatty umbilical hernia. Musculoskeletal: No acute or aggressive finding. Advanced lumbar facet degeneration. IMPRESSION: 1. 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 Please correlate for cholangitis symptoms. 2. Indistinct right liver masses, favor metastatic disease over early biliary abscesses. 3. Nodular omentum most consistent with peritoneal carcinomatosis. 4. Cholelithiasis. 5. No acute or malignant finding in the chest. Electronically Signed   By: Monte Fantasia M.D.   On: 02/01/2019 06:34   Ct Angio Chest Pe W And/or Wo Contrast  Result Date: 02/20/2019 CLINICAL DATA:  68 y/o F; PE suspected, intermediate prob, positive D-dimer. EXAM: CT ANGIOGRAPHY CHEST WITH CONTRAST TECHNIQUE: Multidetector CT imaging of the chest was performed using the standard protocol during bolus administration of intravenous contrast. Multiplanar CT image reconstructions and MIPs were obtained to evaluate the vascular anatomy. CONTRAST:  One hundred thirty-five OMNIPAQUE IOHEXOL 350 MG/ML SOLN COMPARISON:  02/01/2019 CT chest, abdomen pelvis. FINDINGS: Cardiovascular: Multiple attempted acquisitions. Motion artifact. Satisfactory opacification of the pulmonary arteries to the segmental level. No pulmonary embolus identified. Satisfactory opacification of the thoracic aorta no aortic dissection or aneurysm. Mild cardiomegaly heart size. No pericardial  effusion. Right  port catheter tip projects over lower SVC. Coronary artery and aortic calcific atherosclerosis. Mediastinum/Nodes: No enlarged mediastinal, hilar, or axillary lymph nodes. Thyroid gland, trachea, and esophagus demonstrate no significant findings. Lungs/Pleura: Small right and trace left pleural effusions. Platelike atelectasis in the left lower lobe. Small area of consolidation the base of right lower lobe. No pneumothorax. Few clustered 2-3 mm nodules at the right lung apex are stable. Upper Abdomen: Increased ascites in the upper abdomen with loculations along the liver surface as well as pneumobilia, incompletely visualized. Musculoskeletal: No chest wall abnormality. No acute or significant osseous findings. Review of the MIP images confirms the above findings. IMPRESSION: 1. Motion artifact. No pulmonary embolus identified. 2. Small right and trace left pleural effusions. 3. Small area of consolidation in the right lower lobe may represent pneumonia or atelectasis. 4. Coronary artery and aortic calcific atherosclerosis. 5. Increased ascites in the upper abdomen with loculations along the liver surface as well as pneumobilia, incompletely visualized. Electronically Signed   By: Kristine Garbe M.D.   On: 02/20/2019 01:14   Ct Abdomen Pelvis W Contrast  Result Date: 02/01/2019 CLINICAL DATA:  Cough with persistent shortness of breath. EXAM: CT CHEST, ABDOMEN, AND PELVIS WITH CONTRAST TECHNIQUE: Multidetector CT imaging of the chest, abdomen and pelvis was performed following the standard protocol during bolus administration of intravenous contrast. CONTRAST:  128mL ISOVUE-300 IOPAMIDOL (ISOVUE-300) INJECTION 61% COMPARISON:  Right upper quadrant ultrasound earlier today FINDINGS: CT CHEST FINDINGS Cardiovascular: Normal heart size. No pericardial effusion. Coronary atherosclerosis. Mild aortic atherosclerosis. Mediastinum/Nodes: Negative for adenopathy. Lungs/Pleura: Mild dependent atelectasis. No  consolidation or nodularity Musculoskeletal: No acute or aggressive finding. Generalized spondylosis. CT ABDOMEN PELVIS FINDINGS Hepatobiliary: 4 or 5 indistinct liver masses centered on the right, measuring up to 2.3 cm. There is cholelithiasis and gallbladder wall thickening with mild fat indistinctness. Murphy sign was negative on prior ultrasound. There is intrahepatic biliary duct dilatation above a circumferentially thickened common bile duct. Small subcapsular fluid collection along the right liver without serosal enhancement, favor loculated peritoneal fluid. Pancreas: No evident mass or inflammation Spleen: Eggshell calcification at the hilum measuring 2.3 cm, compatible with remote insult. Adrenals/Urinary Tract: Negative adrenals. No hydronephrosis or stone. Negative bladder. Stomach/Bowel: No evident obstruction, inflammation, or mass. Vascular/Lymphatic: Prominent but not strictly enlarged lymph nodes in the deep liver drainage. Reproductive: Negative Other: Small volume ascites. Nodular appearance of the omentum including omentum that is herniated through a fatty umbilical hernia. Musculoskeletal: No acute or aggressive finding. Advanced lumbar facet degeneration. IMPRESSION: 1. 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 Please correlate for cholangitis symptoms. 2. Indistinct right liver masses, favor metastatic disease over early biliary abscesses. 3. Nodular omentum most consistent with peritoneal carcinomatosis. 4. Cholelithiasis. 5. No acute or malignant finding in the chest. Electronically Signed   By: Monte Fantasia M.D.   On: 02/01/2019 06:34   Mr Liver W Wo Contrast  Result Date: 02/06/2019 CLINICAL DATA:  68 year old female with history of abnormal CT scan concerning for potential cholangiocarcinoma. Follow-up study. EXAM: MRI ABDOMEN WITHOUT AND WITH CONTRAST TECHNIQUE:  Multiplanar multisequence MR imaging of the abdomen was performed both before and after the administration of intravenous contrast. CONTRAST:  8 mL of Gadavist. COMPARISON:  No prior abdominal MRI. CT the chest, abdomen and pelvis 02/01/2019. FINDINGS: Comment: Today's study is limited by considerable patient respiratory motion. Lower chest: T2 signal intensity dependently in the lower right hemithorax,  compatible with a small to moderate right pleural effusion, likely with some associated passive atelectasis in the right lower lobe. Hepatobiliary: There are multiple hepatic lesions predominantly throughout the right lobe of the liver. The largest of these is in segment 6 (axial image 26 of series 5) measuring 2.4 x 3.7 cm. These lesions are all T1 hypointense, heterogeneously T2 hyperintense, demonstrate diffusion restriction, and are hypovascular with peripheral enhancement on post gadolinium imaging, highly concerning for metastatic lesions. MRCP imaging was not performed. However, the previously noted intrahepatic biliary ductal dilatation evident on CT examination 02/01/2019 is no longer noted on today's study. Mild T2 hyperintensity in the periportal aspects of the hepatic parenchyma, suggesting periportal edema. There is a subtle signal void in the common bile duct, likely reflective of an indwelling common bile duct stent. Some dependent T2 hypointensity and multiple well-defined rounded T2 hypo intensities are noted within the gallbladder, compatible with a combination of biliary sludge and gallstones. Gallbladder does not appear distended. Gallbladder wall does not appear thickened. Pancreas: No pancreatic mass. No pancreatic ductal dilatation. No pancreatic or peripancreatic fluid or inflammatory changes. Spleen: Well-defined T1 hypointense, T2 hyperintense, nonenhancing lesion in the anterior aspect of the spleen, corresponding to rim calcified structure on recent CT examination, likely sequela of remote  splenic trauma. Spleen is otherwise unremarkable in appearance. Adrenals/Urinary Tract: Bilateral kidneys and adrenal glands are normal in appearance. No hydroureteronephrosis in the visualized portions of the abdomen. Stomach/Bowel: Visualized portions are unremarkable. Vascular/Lymphatic: No aneurysm identified in the visualized abdominal vasculature. Mildly enlarged portacaval lymph node measuring up to 1.6 cm in short axis (axial image forty-nine of series 13). Other:  Small volume of ascites most evident inferior to the liver. Musculoskeletal: No aggressive appearing osseous lesions are noted in the visualized portions of the skeleton. IMPRESSION: 1. Multiple malignant appearing lesions throughout the right lobe of the liver, most likely to reflect metastatic lesions. This is associated with some portacaval lymphadenopathy. 2. Previously noted intrahepatic biliary ductal dilatation has resolved following placement of common bile duct stent and sphincterotomy. 3. Cholelithiasis and biliary sludge in the gallbladder. No findings to suggest an acute cholecystitis at this time. 4. Trace volume of ascites. Electronically Signed   By: Vinnie Langton M.D.   On: 02/06/2019 06:42   US Paracentesis  Result Date: 02/20/2019 INDICATION: Cholangiocarcinoma. Abdominal distention and ascites. Request for diagnostic and therapeutic paracentesis. EXAM: ULTRASOUND GUIDED LEFT LOWER QUADRANT PARACENTESIS MEDICATIONS: None. COMPLICATIONS: None immediate. PROCEDURE: Informed written consent was obtained from the patient after a discussion of the risks, benefits and alternatives to treatment. A timeout was performed prior to the initiation of the procedure. Initial ultrasound scanning demonstrates a moderate amount of ascites within the left lower abdominal quadrant. The left lower abdomen was prepped and draped in the usual sterile fashion. 1% lidocaine with epinephrine was used for local anesthesia. Following this, a 6 Fr  Safe-T-Centesis catheter was introduced. An ultrasound image was saved for documentation purposes. The paracentesis was performed. The catheter was removed and a dressing was applied. The patient tolerated the procedure well without immediate post procedural complication. FINDINGS: A total of approximately 2.8 L of clear yellow fluid was removed. Samples were sent to the laboratory as requested by the clinical team. IMPRESSION: Successful ultrasound-guided paracentesis yielding 2.8 liters of peritoneal fluid. Read by: Ascencion Dike PA-C Electronically Signed   By: Sandi Mariscal M.D.   On: 02/20/2019 11:17   Dg Chest Port 1 View  Result Date: 02/19/2019 CLINICAL DATA:  Sepsis,  vomiting EXAM: PORTABLE CHEST 1 VIEW COMPARISON:  Chest CT 02/01/2019 FINDINGS: Right Port-A-Cath in place with the tip in the SVC. Low lung volumes with bibasilar atelectasis and small effusions, right greater than left. Heart is normal size. No acute bony abnormality. IMPRESSION: Low lung volumes with bibasilar atelectasis and small effusions, right greater than left. Electronically Signed   By: Rolm Baptise M.D.   On: 02/19/2019 23:35   Dg C-arm 1-60 Min-no Report  Result Date: 02/01/2019 Fluoroscopy was utilized by the requesting physician.  No radiographic interpretation.   US Abdomen Limited Ruq  Result Date: 02/20/2019 CLINICAL DATA:  68 y/o F; right upper quadrant abdominal pain with pneumobilia. History of metastatic liver cancer and cholangiocarcinoma. EXAM: ULTRASOUND ABDOMEN LIMITED RIGHT UPPER QUADRANT COMPARISON:  02/06/2019 MRI of the abdomen FINDINGS: Gallbladder: Gallbladder sludge and stones measuring up to 1.9 cm. No gallbladder wall thickening. Negative sonographic Murphy's sign. Common bile duct: Diameter: 5.6 mm.  Common bile duct pneumobilia. Liver: Pneumobilia predominantly in the left lobe. Numerous ill-defined liver masses seen within the right lobe of liver better characterized on the prior MRI of the  abdomen. Loculated fluid collections along the surface of the right lobe of liver. Portal vein is patent on color Doppler imaging with normal direction of blood flow towards the liver. IMPRESSION: 1. Intrahepatic and common bile duct pneumobilia. 2. Cholelithiasis and gallbladder sludge. No findings of acute cholecystitis. 3. Ill-defined masses in right lobe of liver better characterized on prior MRI. 4. Loculated fluid collections along the surface of right lobe of liver. Electronically Signed   By: Kristine Garbe M.D.   On: 02/20/2019 04:49   US Abdomen Limited Ruq  Result Date: 02/01/2019 CLINICAL DATA:  Episodic right upper quadrant pain EXAM: ULTRASOUND ABDOMEN LIMITED RIGHT UPPER QUADRANT COMPARISON:  None. FINDINGS: Gallbladder: Gallbladder sludge and multiple shadowing stones, including a stone at the neck. Gallbladder wall is mildly thickened to 5 mm. No pericholecystic edema. Negative sonographic Murphy sign. Common bile duct: Diameter: 8 mm. Where visualized, no filling defect. There is intrahepatic bile duct dilatation Liver: There are 3 solid appearing liver masses seen in the right liver measuring up to 5.8 cm. Portal vein is patent on color Doppler imaging with normal direction of blood flow towards the liver. IMPRESSION: 1. At least 3 liver masses primarily concerning for metastatic disease. Recommend enhanced abdomen and pelvis CT. 2. Cholelithiasis and sludge including stone at the gallbladder neck or cystic duct. The incompletely visualized common bile duct is also dilated and there is intrahepatic biliary dilatation. There could be obstructing stone or mass, attention on follow-up CT. 3. No suspected cholecystitis. Electronically Signed   By: Monte Fantasia M.D.   On: 02/01/2019 05:48     CBC Recent Labs  Lab 02/19/19 2256 02/21/19 0323  WBC 8.0 6.9  HGB 10.9* 9.9*  HCT 35.5* 32.9*  PLT 609* 505*  MCV 85.5 87.0  MCH 26.3 26.2  MCHC 30.7 30.1  RDW 16.3* 16.3*   LYMPHSABS 0.8  --   MONOABS 1.2*  --   EOSABS 0.0  --   BASOSABS 0.1  --     Chemistries  Recent Labs  Lab 02/19/19 2256 02/21/19 0323  NA 136 139  K 4.1 3.9  CL 97* 99  CO2 30 32  GLUCOSE 164* 143*  BUN 12 10  CREATININE 0.59 0.53  CALCIUM 8.2* 8.0*  AST 25 28  ALT 15 12  ALKPHOS 264* 192*  BILITOT 0.8 0.8   ------------------------------------------------------------------------------------------------------------------ estimated  creatinine clearance is 77.6 mL/min (by C-G formula based on SCr of 0.53 mg/dL). ------------------------------------------------------------------------------------------------------------------ No results for input(s): HGBA1C in the last 72 hours. ------------------------------------------------------------------------------------------------------------------ No results for input(s): CHOL, HDL, LDLCALC, TRIG, CHOLHDL, LDLDIRECT in the last 72 hours. ------------------------------------------------------------------------------------------------------------------ Recent Labs    02/20/19 1633  TSH 4.456   ------------------------------------------------------------------------------------------------------------------ No results for input(s): VITAMINB12, FOLATE, FERRITIN, TIBC, IRON, RETICCTPCT in the last 72 hours.  Coagulation profile Recent Labs  Lab 02/19/19 2256  INR 1.4    No results for input(s): DDIMER in the last 72 hours.  Cardiac Enzymes Recent Labs  Lab 02/19/19 2256  TROPONINI <0.03   ------------------------------------------------------------------------------------------------------------------ Invalid input(s): POCBNP    Assessment & Plan   IMPRESSION AND PLAN: Patient 68 year old presenting with generalized weakness  1.    Pneumonia, continue azithromycin and ceftriaxone follow procalcitonin levels   2.  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  Diabetes type 2 we will continue her home medications plus her sliding scale insulin  4.  Hypothyroidism continue Synthroid  5.  Seizures continue Dilantin and Lamictal  6.  GERD continue omeprazole  7.  Miscellaneous Lovenox for DVT prophylaxis)      Code Status Orders  (From admission, onward)         Start     Ordered   02/20/19 1353  Full code  Continuous     02/20/19 1352        Code Status History    Date Active Date Inactive Code Status Order ID Comments User Context   02/01/2019 0957 02/06/2019 1731 Full Code [id number]  Sela Hua, MD Inpatient   02/02/2018 0339 02/04/2018 1924 Full Code [id number]  Harrie Foreman, MD Inpatient           Consults oncology   DVT Prophylaxis  Lovenox   Lab Results  Component Value Date   PLT 505 (H) 02/21/2019     Time Spent in minutes   71minGreater than 50% of time spent in care coordination and counseling patient regarding the condition and plan of care.   Dustin Flock M.D on 02/21/2019 at 2:07 PM  Between 7am to 6pm - Pager - [phone]  After 6pm go to [email or web address] - Proofreader  [employer]   Office  [phone]

## 2019-02-21 NOTE — Evaluation (Signed)
Physical Therapy Evaluation Patient Details Name: Kaylee Wolf MRN: [id number] DOB: [birth year] Today's Date: 02/21/2019   History of Present Illness  From MD H&P: Pt is a 68 y.o. 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  Patient's daughter states that she was doing okay and was able to ambulate some then all of a sudden she started becoming very weak and limp.  Patient also has had some dry cough.  Assessment includes: Pneumonia with paracentesis from LLQ 02/20/19 with 2.8 L fluid removed, Cholangiocarcinoma with mets now with worsening ascites, hypothyroidism, seizures, and GERD.      Clinical Impression  Pt presents with deficits in strength, transfers, mobility, gait, balance, and activity tolerance.  Pt required min A with bed mobility tasks and mod A to stand from an elevated EOB.  Once in standing the pt presented with posterior weight shift and required constant mod A to prevent posterior LOB.  Multiple attempts made to get the pt to anterior weight shift with the pt unable to do so.  With physical assistance the pt could come to more neutral standing but when assistance was removed she would immediately lose balance posteriorly again.  The pt presents with significant decline in function compared to her baseline and would not be safe to return to her prior living situation at this time.  Pt will benefit from PT services in a SNF setting upon discharge to safely address above deficits for decreased caregiver assistance and eventual return to PLOF.      Follow Up Recommendations SNF    Equipment Recommendations  Other (comment)(TBD at next venue of care)    Recommendations for Other Services       Precautions / Restrictions Precautions Precautions: Fall Restrictions Weight Bearing Restrictions: No      Mobility  Bed Mobility Overal bed  mobility: Needs Assistance Bed Mobility: Sit to Supine;Supine to Sit     Supine to sit: Min assist Sit to supine: Min assist   General bed mobility comments: Min A for trunk to upright position during sup to sit and for BLEs into bed during sit to sup  Transfers Overall transfer level: Needs assistance Equipment used: Rolling walker (2 wheeled) Transfers: Sit to/from Stand Sit to Stand: Mod assist;From elevated surface         General transfer comment: Pt required mod A to stand from an elevated EOB with cues for hand placement; once in standing pt required constant Mod A to prevent posterior LOB.   Ambulation/Gait             General Gait Details: Unable   Stairs            Wheelchair Mobility    Modified Rankin (Stroke Patients Only)       Balance Overall balance assessment: Needs assistance Sitting-balance support: Bilateral upper extremity supported;Feet supported Sitting balance-Leahy Scale: Fair     Standing balance support: Bilateral upper extremity supported Standing balance-Leahy Scale: Poor Standing balance comment: Constant Mod A in standing to prevent posterior LOB                             Pertinent Vitals/Pain Pain Assessment: No/denies pain    Home Living Family/patient expects to be discharged to:: Private residence Living Arrangements: Other relatives;Children(daughter) Available Help at Discharge: Family;Available 24 hours/day Type of Home:  House Home Access: Stairs to enter Entrance Stairs-Rails: None Entrance Stairs-Number of Steps: 2 Home Layout: One level Home Equipment: [place] - 4 wheels;Walker - standard      Prior Function Level of Independence: Needs assistance   Gait / Transfers Assistance Needed: SBA with amb with a 4WW household distances, no recent falls; prior to previous recent admission pt was able to amb limited community distances without an AD and was Ind with ADLs  ADL's / Homemaking Assistance  Needed: Daughter assists with ADLs as needed        Hand Dominance        Extremity/Trunk Assessment   Upper Extremity Assessment Upper Extremity Assessment: Generalized weakness    Lower Extremity Assessment Lower Extremity Assessment: Generalized weakness       Communication   Communication: HOH  Cognition Arousal/Alertness: Awake/alert Behavior During Therapy: WFL for tasks assessed/performed Overall Cognitive Status: No family/caregiver present to determine baseline cognitive functioning                                 General Comments: Pt able to follow commands and provide history but was mildly confused at times including difficulty recalling the year and stating she was 68 years old      General Comments      Exercises Total Joint Exercises Ankle Circles/Pumps: Strengthening;Both;10 reps;15 reps Quad Sets: Strengthening;Both;10 reps;15 reps Gluteal Sets: Strengthening;Both;10 reps Short Arc Quad: Strengthening;Both;10 reps Heel Slides: AROM;Both;5 reps Hip ABduction/ADduction: AROM;AAROM;Both;10 reps Straight Leg Raises: AROM;AAROM;Both;10 reps Long Arc Quad: Strengthening;Both;10 reps Knee Flexion: Strengthening;Both;10 reps   Assessment/Plan    PT Assessment Patient needs continued PT services  PT Problem List Decreased strength;Decreased activity tolerance;Decreased balance;Decreased mobility;Decreased knowledge of use of DME       PT Treatment Interventions DME instruction;Gait training;Stair training;Functional mobility training;Therapeutic activities;Therapeutic exercise;Balance training;Patient/family education    PT Goals (Current goals can be found in the Care Plan section)  Acute Rehab PT Goals Patient Stated Goal: To get my strength back and get home PT Goal Formulation: With patient Time For Goal Achievement: 03/06/19 Potential to Achieve Goals: Fair    Frequency Min 2X/week   Barriers to discharge Inaccessible home  environment      Co-evaluation               AM-PAC PT "6 Clicks" Mobility  Outcome Measure Help needed turning from your back to your side while in a flat bed without using bedrails?: A Little Help needed moving from lying on your back to sitting on the side of a flat bed without using bedrails?: A Little Help needed moving to and from a bed to a chair (including a wheelchair)?: A Lot Help needed standing up from a chair using your arms (e.g., wheelchair or bedside chair)?: A Lot Help needed to walk in hospital room?: Total Help needed climbing 3-5 steps with a railing? : Total 6 Click Score: 12    End of Session Equipment Utilized During Treatment: Gait belt Activity Tolerance: Patient tolerated treatment well Patient left: in bed;with bed alarm set;with call bell/phone within reach Nurse Communication: Mobility status PT Visit Diagnosis: Unsteadiness on feet (R26.81);Muscle weakness (generalized) (M62.81);Difficulty in walking, not elsewhere classified (R26.2)    Time: 1093-2355 PT Time Calculation (min) (ACUTE ONLY): 31 min   Charges:   PT Evaluation $PT Eval Moderate Complexity: 1 Mod PT Treatments $Therapeutic Exercise: 8-22 mins        D.  Scott Elasha Tess PT, DPT 02/21/19, 3:25 PM

## 2019-02-22 ENCOUNTER — Inpatient Hospital Stay: Payer: Medicare HMO

## 2019-02-22 ENCOUNTER — Inpatient Hospital Stay: Payer: Medicare HMO | Admitting: Oncology

## 2019-02-22 ENCOUNTER — Inpatient Hospital Stay: Payer: Medicare HMO | Admitting: Hospice and Palliative Medicine

## 2019-02-22 DIAGNOSIS — Z515 Encounter for palliative care: Secondary | ICD-10-CM

## 2019-02-22 DIAGNOSIS — J189 Pneumonia, unspecified organism: Principal | ICD-10-CM

## 2019-02-22 LAB — GLUCOSE, CAPILLARY
Glucose-Capillary: 110 mg/dL — ABNORMAL HIGH (ref 70–99)
Glucose-Capillary: 120 mg/dL — ABNORMAL HIGH (ref 70–99)
Glucose-Capillary: 200 mg/dL — ABNORMAL HIGH (ref 70–99)
Glucose-Capillary: 112 mg/dL — ABNORMAL HIGH (ref 70–99)

## 2019-02-22 LAB — PROCALCITONIN: Procalcitonin: 0.7 ng/mL

## 2019-02-22 NOTE — NC FL2 (Signed)
[place] LEVEL OF CARE SCREENING TOOL     IDENTIFICATION  Patient Name: Kaylee Wolf Birthdate: 1951/06/24 Sex: female Admission Date (Current Location): 02/19/2019  [place] and [place] Number:  Engineering geologist and Address:  [facility], [address] [place], [place] [address]      Provider Number: [id number]  Attending Physician Name and Address:  Henreitta Leber, MD  Relative Name and Phone Number:       Current Level of Care: Hospital Recommended Level of Care: [place] Prior Approval Number:    Date Approved/Denied:   PASRR Number: [id number]  Discharge Plan: SNF    Current Diagnoses: Patient Active Problem List   Diagnosis Date Noted  . SOB (shortness of breath) 02/20/2019  . Goals of care, counseling/discussion 02/12/2019  . Peritoneal carcinomatosis ([place]) 02/12/2019  . Cholangiocarcinoma metastatic to liver ([place]) 02/12/2019  . Pressure injury of skin 02/02/2019  . Cholangiocarcinoma ([place]) 02/01/2019  . Gall stones 04/18/2018  . Syncope 02/03/2018  . Epilepsy ([place]) 02/02/2018  . Pain in both upper extremities 09/06/2017  . Anxiety 07/03/2017  . Onychomycosis of left great toe 02/07/2017  . Allergic rhinitis due to allergen 02/07/2017  . Arthralgia of right temporomandibular joint 02/07/2017  . 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 Recurrent major depressive disorder ([place]) 10/24/2015  . Varicose veins 10/24/2015  . Seizures ([place]) 10/24/2015  . Tinea pedis of both feet 10/24/2015  . Insomnia 10/24/2015  . RLS (restless legs syndrome) 10/24/2015  . Avitaminosis D 11/13/2012  . HLD (hyperlipidemia) 08/14/2012    Orientation RESPIRATION BLADDER Height & Weight     Self, Place  O2(2 liters ) Incontinent Weight: 197 lb 12 oz (89.7 kg) Height:  5\' 5"  (165.1 cm)  BEHAVIORAL SYMPTOMS/MOOD  NEUROLOGICAL BOWEL NUTRITION STATUS  (none) (None) Incontinent Diet(Heart Healthy )  AMBULATORY STATUS COMMUNICATION OF NEEDS Skin   Extensive Assist Verbally Other (Comment)(Stage 1 on Coccyx )                       Personal Care Assistance Level of Assistance  Bathing, Feeding, Dressing Bathing Assistance: Limited assistance Feeding assistance: Independent Dressing Assistance: Limited assistance     Functional Limitations Info  Sight, Hearing, Speech Sight Info: Adequate Hearing Info: Adequate Speech Info: Adequate    SPECIAL CARE FACTORS FREQUENCY  PT (By licensed PT), OT (By licensed OT)     PT Frequency: 5 OT Frequency: 5            Contractures Contractures Info: Not present    Additional Factors Info  Code Status, Allergies Code Status Info: Full Code  Allergies Info: NKA           Current Medications (02/22/2019):  This is the current hospital active medication list Current Facility-Administered Medications  Medication Dose Route Frequency Provider Last Rate Last Dose  . 0.9 %  sodium chloride infusion  250 mL Intravenous PRN Dustin Flock, MD      . acetaminophen (TYLENOL) tablet 650 mg  650 mg Oral Q6H PRN Dustin Flock, MD       Or  . acetaminophen (TYLENOL) suppository 650 mg  650 mg Rectal Q6H PRN Dustin Flock, MD      . aspirin EC tablet 81 mg  81 mg Oral Daily Dustin Flock, MD   81 mg at 02/21/19 1011  . atorvastatin (LIPITOR)  tablet 20 mg  20 mg Oral Daily Dustin Flock, MD   20 mg at 02/21/19 1011  . azithromycin (ZITHROMAX) 500 mg in sodium chloride 0.9 % 250 mL IVPB  500 mg Intravenous Q24H Dustin Flock, MD   Stopped at 02/21/19 1340  . Brexpiprazole TABS 3 mg  3 mg Oral q morning - 10a Dustin Flock, MD   3 mg at 02/21/19 1015  . cefTRIAXone (ROCEPHIN) 1 g in sodium chloride 0.9 % 100 mL IVPB  1 g Intravenous Q24H Dustin Flock, MD   Stopped at 02/21/19 1101  . cholecalciferol (VITAMIN D) tablet 1,000 Units  1,000 Units  Oral Daily Dustin Flock, MD   1,000 Units at 02/21/19 1012  . clonazePAM (KLONOPIN) tablet 0.5 mg  0.5 mg Oral QHS Dustin Flock, MD   0.5 mg at 02/21/19 2253  . divalproex (DEPAKOTE) DR tablet 250 mg  250 mg Oral Q12H Dustin Flock, MD   250 mg at 02/21/19 2254  . enoxaparin (LOVENOX) injection 40 mg  40 mg Subcutaneous Q24H Dustin Flock, MD   40 mg at 02/21/19 1500  . escitalopram (LEXAPRO) tablet 20 mg  20 mg Oral Daily Dustin Flock, MD   20 mg at 02/21/19 1012  . furosemide (LASIX) tablet 40 mg  40 mg Oral Daily Dustin Flock, MD   40 mg at 02/21/19 1011  . gabapentin (NEURONTIN) capsule 400 mg  400 mg Oral BID Dustin Flock, MD   400 mg at 02/21/19 2250  . insulin aspart (novoLOG) injection 0-9 Units  0-9 Units Subcutaneous TID WC Dustin Flock, MD   2 Units at 02/21/19 1504  . lamoTRIgine (LAMICTAL) tablet 150 mg  150 mg Oral BID Dustin Flock, MD   150 mg at 02/21/19 2252  . levothyroxine (SYNTHROID, LEVOTHROID) tablet 50 mcg  50 mcg Oral QAC breakfast Dustin Flock, MD   50 mcg at 02/22/19 2094  . metFORMIN (GLUCOPHAGE) tablet 500 mg  500 mg Oral BID WC Dustin Flock, MD   500 mg at 02/21/19 1014  . multivitamin with minerals tablet 1 tablet  1 tablet Oral Daily Dustin Flock, MD   1 tablet at 02/21/19 1011  . ondansetron (ZOFRAN) tablet 4 mg  4 mg Oral Q6H PRN Dustin Flock, MD       Or  . ondansetron [facility]) injection 4 mg  4 mg Intravenous Q6H PRN Dustin Flock, MD   4 mg at 02/21/19 1745  . oxyCODONE (Oxy IR/ROXICODONE) immediate release tablet 5 mg  5 mg Oral Q6H PRN Dustin Flock, MD      . pantoprazole (PROTONIX) EC tablet 40 mg  40 mg Oral Daily Dustin Flock, MD   40 mg at 02/21/19 1011  . phenytoin (DILANTIN) ER capsule 100 mg  100 mg Oral BID Dustin Flock, MD   100 mg at 02/21/19 2254  . polyethylene glycol (MIRALAX / GLYCOLAX) packet 17 g  17 g Oral Daily PRN Dustin Flock, MD      . sodium chloride 0.9 % bolus 1,000 mL  1,000 mL  Intravenous Once Nena Polio, MD   Stopped at 02/19/19 2323  . sodium chloride flush (NS) 0.9 % injection 3 mL  3 mL Intravenous Once Nena Polio, MD      . sodium chloride flush (NS) 0.9 % injection 3 mL  3 mL Intravenous Q12H Dustin Flock, MD   3 mL at 02/21/19 1032  . sodium chloride flush (NS) 0.9 % injection 3 mL  3 mL Intravenous PRN  Dustin Flock, MD         Discharge Medications: Please see discharge summary for a list of discharge medications.  Relevant Imaging Results:  Relevant Lab Results:   Additional Information SSN: [id number]  Annamaria Boots, [place]

## 2019-02-22 NOTE — Consult Note (Signed)
[place]  [phone] [phone]   Name: Kaylee Wolf Date: 02/22/2019 MRN: [id number]  DOB: [birth year]  Patient Care Team: Remi Haggard, FNP as PCP - General (Family Medicine)    REASON FOR CONSULTATION: Palliative Care consult requested for this 68 y.o. 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  Patient was found to have possible pneumonia and worsening ascites.  Patient is status post paracentesis with 2.5 L of fluid removed.  She has had some lethargy and confusion.  Palliative care was consulted to help address goals.   SOCIAL HISTORY:     reports that she has never smoked. She has never used smokeless tobacco. She reports that she does not drink alcohol or use drugs.   Lives at home with daughter.  ADVANCE DIRECTIVES:  Not on file  CODE STATUS: Full code  PAST MEDICAL HISTORY: Past Medical History:  Diagnosis Date  . Anxiety   . Colon polyp 11/29/2012   at [place].  . Insomnia   . Mixed hyperlipidemia   . Recurrent major depressive disorder ([place])   . RLS (restless legs syndrome)   . Seizures ([place])   . Tinea pedis of both feet   . Type 2 diabetes mellitus ([place]) 2014?  [name] Varicose veins     PAST SURGICAL HISTORY:  Past Surgical History:  Procedure Laterality Date  . COLONOSCOPY  11/29/2012   Dr [name]  . ERCP N/A 02/01/2019   Procedure: ENDOSCOPIC RETROGRADE CHOLANGIOPANCREATOGRAPHY (ERCP);  Surgeon: [name], MD;  Location: [facility] ENDOSCOPY;  Service: Endoscopy;  Laterality: N/A;  . none    . PORTA CATH INSERTION N/A 02/15/2019   Procedure: PORTA CATH INSERTION;  Surgeon: Algernon Huxley, MD;  Location: [place] CV LAB;  Service: Cardiovascular;  Laterality: N/A;     HEMATOLOGY/ONCOLOGY HISTORY:    Cholangiocarcinoma metastatic to liver ([place])   02/08/2019 Cancer Staging    Staging form: Distal Bile Duct, AJCC 8th Edition - Clinical stage from 02/08/2019: Stage IV (cTX, cN1, cM1) - Signed by Sindy Guadeloupe, MD on 02/12/2019    02/12/2019 Initial Diagnosis    Cholangiocarcinoma metastatic to liver ([place])    02/22/2019 -  Chemotherapy    The patient had palonosetron (ALOXI) injection 0.25 mg, 0.25 mg, Intravenous,  Once, 0 of 4 cycles CISplatin (PLATINOL) 52 mg in sodium chloride 0.9 % 250 mL chemo infusion, 25 mg/m2, Intravenous,  Once, 0 of 4 cycles gemcitabine (GEMZAR) 2,052 mg in sodium chloride 0.9 % 250 mL chemo infusion, 1,000 mg/m2, Intravenous,  Once, 0 of 4 cycles fosaprepitant (EMEND) 150 mg, dexamethasone (DECADRON) 12 mg in sodium chloride 0.9 % 145 mL IVPB, , Intravenous,  Once, 0 of 4 cycles  for chemotherapy treatment.      ALLERGIES:  has No Known Allergies.  MEDICATIONS:  Current Facility-Administered Medications  Medication Dose Route Frequency Provider Last Rate Last Dose  . 0.9 %  sodium chloride infusion  250 mL Intravenous PRN Dustin Flock, MD      . acetaminophen (TYLENOL) tablet 650 mg  650 mg Oral Q6H PRN Dustin Flock, MD       Or  . acetaminophen (TYLENOL) suppository 650 mg  650 mg Rectal Q6H PRN Dustin Flock, MD      . aspirin EC tablet 81 mg  81 mg Oral  Daily Dustin Flock, MD   81 mg at 02/22/19 1035  . atorvastatin (LIPITOR) tablet 20 mg  20 mg Oral Daily Dustin Flock, MD   20 mg at 02/22/19 1035  . azithromycin (ZITHROMAX) 500 mg in sodium chloride 0.9 % 250 mL IVPB  500 mg Intravenous Q24H Dustin Flock, MD   Stopped at 02/22/19 1246  . Brexpiprazole TABS 3 mg  3 mg Oral q morning - 10a Dustin Flock, MD   3 mg at 02/22/19 1505  . cefTRIAXone (ROCEPHIN) 1 g in sodium chloride 0.9 % 100 mL IVPB  1 g Intravenous Q24H Dustin Flock, MD   Stopped at 02/22/19 1117  . cholecalciferol (VITAMIN D) tablet  1,000 Units  1,000 Units Oral Daily Dustin Flock, MD   1,000 Units at 02/22/19 1035  . clonazePAM (KLONOPIN) tablet 0.5 mg  0.5 mg Oral QHS Dustin Flock, MD   0.5 mg at 02/21/19 2253  . divalproex (DEPAKOTE) DR tablet 250 mg  250 mg Oral Q12H Dustin Flock, MD   250 mg at 02/22/19 1121  . enoxaparin (LOVENOX) injection 40 mg  40 mg Subcutaneous Q24H Dustin Flock, MD   40 mg at 02/22/19 1431  . escitalopram (LEXAPRO) tablet 20 mg  20 mg Oral Daily Dustin Flock, MD   20 mg at 02/22/19 1120  . furosemide (LASIX) tablet 40 mg  40 mg Oral Daily Dustin Flock, MD   40 mg at 02/22/19 1035  . gabapentin (NEURONTIN) capsule 400 mg  400 mg Oral BID Dustin Flock, MD   400 mg at 02/22/19 1035  . insulin aspart (novoLOG) injection 0-9 Units  0-9 Units Subcutaneous TID WC Dustin Flock, MD   2 Units at 02/22/19 1245  . lamoTRIgine (LAMICTAL) tablet 150 mg  150 mg Oral BID Dustin Flock, MD   150 mg at 02/22/19 1035  . levothyroxine (SYNTHROID, LEVOTHROID) tablet 50 mcg  50 mcg Oral QAC breakfast Dustin Flock, MD   50 mcg at 02/22/19 9518  . metFORMIN (GLUCOPHAGE) tablet 500 mg  500 mg Oral BID WC Dustin Flock, MD   500 mg at 02/22/19 1732  . multivitamin with minerals tablet 1 tablet  1 tablet Oral Daily Dustin Flock, MD   1 tablet at 02/22/19 1119  . ondansetron (ZOFRAN) tablet 4 mg  4 mg Oral Q6H PRN Dustin Flock, MD       Or  . ondansetron [facility]) injection 4 mg  4 mg Intravenous Q6H PRN Dustin Flock, MD   4 mg at 02/21/19 1745  . oxyCODONE (Oxy IR/ROXICODONE) immediate release tablet 5 mg  5 mg Oral Q6H PRN Dustin Flock, MD      . pantoprazole (PROTONIX) EC tablet 40 mg  40 mg Oral Daily Dustin Flock, MD   40 mg at 02/22/19 1034  . phenytoin (DILANTIN) ER capsule 100 mg  100 mg Oral BID Dustin Flock, MD   100 mg at 02/22/19 1120  . polyethylene glycol (MIRALAX / GLYCOLAX) packet 17 g  17 g Oral Daily PRN Dustin Flock, MD      . sodium chloride 0.9 % bolus  1,000 mL  1,000 mL Intravenous Once Nena Polio, MD   Stopped at 02/19/19 2323  . sodium chloride flush (NS) 0.9 % injection 3 mL  3 mL Intravenous Once Nena Polio, MD      . sodium chloride flush (NS) 0.9 % injection 3 mL  3 mL Intravenous Q12H Dustin Flock, MD   3 mL at 02/21/19 1032  .  sodium chloride flush (NS) 0.9 % injection 3 mL  3 mL Intravenous PRN Dustin Flock, MD        VITAL SIGNS: BP 111/67 (BP Location: Left Arm)   Pulse (!) 108   Temp 99.1 F (37.3 C) (Oral)   Resp 14   Ht 5\' 5"  (1.651 m)   Wt 197 lb 12 oz (89.7 kg)   LMP 05/10/2012   SpO2 93%   BMI 32.91 kg/m  Filed Weights   02/19/19 2236  Weight: 197 lb 12 oz (89.7 kg)    Estimated body mass index is 32.91 kg/m as calculated from the following:   Height as of this encounter: 5\' 5"  (1.651 m).   Weight as of this encounter: 197 lb 12 oz (89.7 kg).  LABS: CBC:    Component Value Date/Time   WBC 6.9 02/21/2019 0323   HGB 9.9 (L) 02/21/2019 0323   HGB 13.9 04/17/2015 2020   HCT 32.9 (L) 02/21/2019 0323   HCT 41.1 04/17/2015 2020   PLT 505 (H) 02/21/2019 0323   PLT 196 04/17/2015 2020   MCV 87.0 02/21/2019 0323   MCV 89 04/17/2015 2020   NEUTROABS 5.9 02/19/2019 2256   LYMPHSABS 0.8 02/19/2019 2256   MONOABS 1.2 (H) 02/19/2019 2256   EOSABS 0.0 02/19/2019 2256   BASOSABS 0.1 02/19/2019 2256   Comprehensive Metabolic Panel:    Component Value Date/Time   NA 139 02/21/2019 0323   NA 142 04/17/2015 2020   K 3.9 02/21/2019 0323   K 3.8 04/17/2015 2020   CL 99 02/21/2019 0323   CL 107 04/17/2015 2020   CO2 32 02/21/2019 0323   CO2 29 04/17/2015 2020   BUN 10 02/21/2019 0323   BUN 17 04/17/2015 2020   CREATININE 0.53 02/21/2019 0323   CREATININE 0.72 09/14/2016 1504   GLUCOSE 143 (H) 02/21/2019 0323   GLUCOSE 149 (H) 04/17/2015 2020   CALCIUM 8.0 (L) 02/21/2019 0323   CALCIUM 9.2 04/17/2015 2020   AST 28 02/21/2019 0323   AST 20 04/17/2015 2020   ALT 12 02/21/2019 0323   ALT 14  04/17/2015 2020   ALKPHOS 192 (H) 02/21/2019 0323   ALKPHOS 108 04/17/2015 2020   BILITOT 0.8 02/21/2019 0323   BILITOT 0.2 (L) 04/17/2015 2020   PROT 5.3 (L) 02/21/2019 0323   PROT 7.1 04/17/2015 2020   ALBUMIN 1.7 (L) 02/21/2019 0323   ALBUMIN 4.4 04/17/2015 2020    RADIOGRAPHIC STUDIES: Ct Chest W Contrast  Result Date: 02/01/2019 CLINICAL DATA:  Cough with persistent shortness of breath. EXAM: CT CHEST, ABDOMEN, AND PELVIS WITH CONTRAST TECHNIQUE: Multidetector CT imaging of the chest, abdomen and pelvis was performed following the standard protocol during bolus administration of intravenous contrast. CONTRAST:  126mL ISOVUE-300 IOPAMIDOL (ISOVUE-300) INJECTION 61% COMPARISON:  Right upper quadrant ultrasound earlier today FINDINGS: CT CHEST FINDINGS Cardiovascular: Normal heart size. No pericardial effusion. Coronary atherosclerosis. Mild aortic atherosclerosis. Mediastinum/Nodes: Negative for adenopathy. Lungs/Pleura: Mild dependent atelectasis. No consolidation or nodularity Musculoskeletal: No acute or aggressive finding. Generalized spondylosis. CT ABDOMEN PELVIS FINDINGS Hepatobiliary: 4 or 5 indistinct liver masses centered on the right, measuring up to 2.3 cm. There is cholelithiasis and gallbladder wall thickening with mild fat indistinctness. Murphy sign was negative on prior ultrasound. There is intrahepatic biliary duct dilatation above a circumferentially thickened common bile duct. Small subcapsular fluid collection along the right liver without serosal enhancement, favor loculated peritoneal fluid. Pancreas: No evident mass or inflammation Spleen: Eggshell calcification at the hilum measuring 2.3  cm, compatible with remote insult. Adrenals/Urinary Tract: Negative adrenals. No hydronephrosis or stone. Negative bladder. Stomach/Bowel: No evident obstruction, inflammation, or mass. Vascular/Lymphatic: Prominent but not strictly enlarged lymph nodes in the deep liver drainage.  Reproductive: Negative Other: Small volume ascites. Nodular appearance of the omentum including omentum that is herniated through a fatty umbilical hernia. Musculoskeletal: No acute or aggressive finding. Advanced lumbar facet degeneration. IMPRESSION: 1. 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 Please correlate for cholangitis symptoms. 2. Indistinct right liver masses, favor metastatic disease over early biliary abscesses. 3. Nodular omentum most consistent with peritoneal carcinomatosis. 4. Cholelithiasis. 5. No acute or malignant finding in the chest. Electronically Signed   By: Monte Fantasia M.D.   On: 02/01/2019 06:34   Ct Angio Chest Pe W And/or Wo Contrast  Result Date: 02/20/2019 CLINICAL DATA:  68 y/o F; PE suspected, intermediate prob, positive D-dimer. EXAM: CT ANGIOGRAPHY CHEST WITH CONTRAST TECHNIQUE: Multidetector CT imaging of the chest was performed using the standard protocol during bolus administration of intravenous contrast. Multiplanar CT image reconstructions and MIPs were obtained to evaluate the vascular anatomy. CONTRAST:  One hundred thirty-five OMNIPAQUE IOHEXOL 350 MG/ML SOLN COMPARISON:  02/01/2019 CT chest, abdomen pelvis. FINDINGS: Cardiovascular: Multiple attempted acquisitions. Motion artifact. Satisfactory opacification of the pulmonary arteries to the segmental level. No pulmonary embolus identified. Satisfactory opacification of the thoracic aorta no aortic dissection or aneurysm. Mild cardiomegaly heart size. No pericardial effusion. Right port catheter tip projects over lower SVC. Coronary artery and aortic calcific atherosclerosis. Mediastinum/Nodes: No enlarged mediastinal, hilar, or axillary lymph nodes. Thyroid gland, trachea, and esophagus demonstrate no significant findings. Lungs/Pleura: Small right and trace left pleural effusions. Platelike  atelectasis in the left lower lobe. Small area of consolidation the base of right lower lobe. No pneumothorax. Few clustered 2-3 mm nodules at the right lung apex are stable. Upper Abdomen: Increased ascites in the upper abdomen with loculations along the liver surface as well as pneumobilia, incompletely visualized. Musculoskeletal: No chest wall abnormality. No acute or significant osseous findings. Review of the MIP images confirms the above findings. IMPRESSION: 1. Motion artifact. No pulmonary embolus identified. 2. Small right and trace left pleural effusions. 3. Small area of consolidation in the right lower lobe may represent pneumonia or atelectasis. 4. Coronary artery and aortic calcific atherosclerosis. 5. Increased ascites in the upper abdomen with loculations along the liver surface as well as pneumobilia, incompletely visualized. Electronically Signed   By: Kristine Garbe M.D.   On: 02/20/2019 01:14   Ct Abdomen Pelvis W Contrast  Result Date: 02/01/2019 CLINICAL DATA:  Cough with persistent shortness of breath. EXAM: CT CHEST, ABDOMEN, AND PELVIS WITH CONTRAST TECHNIQUE: Multidetector CT imaging of the chest, abdomen and pelvis was performed following the standard protocol during bolus administration of intravenous contrast. CONTRAST:  131mL ISOVUE-300 IOPAMIDOL (ISOVUE-300) INJECTION 61% COMPARISON:  Right upper quadrant ultrasound earlier today FINDINGS: CT CHEST FINDINGS Cardiovascular: Normal heart size. No pericardial effusion. Coronary atherosclerosis. Mild aortic atherosclerosis. Mediastinum/Nodes: Negative for adenopathy. Lungs/Pleura: Mild dependent atelectasis. No consolidation or nodularity Musculoskeletal: No acute or aggressive finding. Generalized spondylosis. CT ABDOMEN PELVIS FINDINGS Hepatobiliary: 4 or 5 indistinct liver masses centered on the right, measuring up to 2.3 cm. There is cholelithiasis and gallbladder wall thickening with mild fat indistinctness. Murphy sign  was negative on prior ultrasound. There is intrahepatic biliary duct dilatation above a circumferentially thickened common bile duct. Small subcapsular fluid collection along  the right liver without serosal enhancement, favor loculated peritoneal fluid. Pancreas: No evident mass or inflammation Spleen: Eggshell calcification at the hilum measuring 2.3 cm, compatible with remote insult. Adrenals/Urinary Tract: Negative adrenals. No hydronephrosis or stone. Negative bladder. Stomach/Bowel: No evident obstruction, inflammation, or mass. Vascular/Lymphatic: Prominent but not strictly enlarged lymph nodes in the deep liver drainage. Reproductive: Negative Other: Small volume ascites. Nodular appearance of the omentum including omentum that is herniated through a fatty umbilical hernia. Musculoskeletal: No acute or aggressive finding. Advanced lumbar facet degeneration. IMPRESSION: 1. 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 Please correlate for cholangitis symptoms. 2. Indistinct right liver masses, favor metastatic disease over early biliary abscesses. 3. Nodular omentum most consistent with peritoneal carcinomatosis. 4. Cholelithiasis. 5. No acute or malignant finding in the chest. Electronically Signed   By: Monte Fantasia M.D.   On: 02/01/2019 06:34   Mr Liver W Wo Contrast  Result Date: 02/06/2019 CLINICAL DATA:  68 year old female with history of abnormal CT scan concerning for potential cholangiocarcinoma. Follow-up study. EXAM: MRI ABDOMEN WITHOUT AND WITH CONTRAST TECHNIQUE: Multiplanar multisequence MR imaging of the abdomen was performed both before and after the administration of intravenous contrast. CONTRAST:  8 mL of Gadavist. COMPARISON:  No prior abdominal MRI. CT the chest, abdomen and pelvis 02/01/2019. FINDINGS: Comment: Today's study is limited by considerable patient respiratory  motion. Lower chest: T2 signal intensity dependently in the lower right hemithorax, compatible with a small to moderate right pleural effusion, likely with some associated passive atelectasis in the right lower lobe. Hepatobiliary: There are multiple hepatic lesions predominantly throughout the right lobe of the liver. The largest of these is in segment 6 (axial image 26 of series 5) measuring 2.4 x 3.7 cm. These lesions are all T1 hypointense, heterogeneously T2 hyperintense, demonstrate diffusion restriction, and are hypovascular with peripheral enhancement on post gadolinium imaging, highly concerning for metastatic lesions. MRCP imaging was not performed. However, the previously noted intrahepatic biliary ductal dilatation evident on CT examination 02/01/2019 is no longer noted on today's study. Mild T2 hyperintensity in the periportal aspects of the hepatic parenchyma, suggesting periportal edema. There is a subtle signal void in the common bile duct, likely reflective of an indwelling common bile duct stent. Some dependent T2 hypointensity and multiple well-defined rounded T2 hypo intensities are noted within the gallbladder, compatible with a combination of biliary sludge and gallstones. Gallbladder does not appear distended. Gallbladder wall does not appear thickened. Pancreas: No pancreatic mass. No pancreatic ductal dilatation. No pancreatic or peripancreatic fluid or inflammatory changes. Spleen: Well-defined T1 hypointense, T2 hyperintense, nonenhancing lesion in the anterior aspect of the spleen, corresponding to rim calcified structure on recent CT examination, likely sequela of remote splenic trauma. Spleen is otherwise unremarkable in appearance. Adrenals/Urinary Tract: Bilateral kidneys and adrenal glands are normal in appearance. No hydroureteronephrosis in the visualized portions of the abdomen. Stomach/Bowel: Visualized portions are unremarkable. Vascular/Lymphatic: No aneurysm identified in the  visualized abdominal vasculature. Mildly enlarged portacaval lymph node measuring up to 1.6 cm in short axis (axial image forty-nine of series 13). Other:  Small volume of ascites most evident inferior to the liver. Musculoskeletal: No aggressive appearing osseous lesions are noted in the visualized portions of the skeleton. IMPRESSION: 1. Multiple malignant appearing lesions throughout the right lobe of the liver, most likely to reflect metastatic lesions. This is associated with some portacaval lymphadenopathy. 2. Previously noted intrahepatic biliary ductal dilatation has resolved  following placement of common bile duct stent and sphincterotomy. 3. Cholelithiasis and biliary sludge in the gallbladder. No findings to suggest an acute cholecystitis at this time. 4. Trace volume of ascites. Electronically Signed   By: Vinnie Langton M.D.   On: 02/06/2019 06:42   US Paracentesis  Result Date: 02/20/2019 INDICATION: Cholangiocarcinoma. Abdominal distention and ascites. Request for diagnostic and therapeutic paracentesis. EXAM: ULTRASOUND GUIDED LEFT LOWER QUADRANT PARACENTESIS MEDICATIONS: None. COMPLICATIONS: None immediate. PROCEDURE: Informed written consent was obtained from the patient after a discussion of the risks, benefits and alternatives to treatment. A timeout was performed prior to the initiation of the procedure. Initial ultrasound scanning demonstrates a moderate amount of ascites within the left lower abdominal quadrant. The left lower abdomen was prepped and draped in the usual sterile fashion. 1% lidocaine with epinephrine was used for local anesthesia. Following this, a 6 Fr Safe-T-Centesis catheter was introduced. An ultrasound image was saved for documentation purposes. The paracentesis was performed. The catheter was removed and a dressing was applied. The patient tolerated the procedure well without immediate post procedural complication. FINDINGS: A total of approximately 2.8 L of clear  yellow fluid was removed. Samples were sent to the laboratory as requested by the clinical team. IMPRESSION: Successful ultrasound-guided paracentesis yielding 2.8 liters of peritoneal fluid. Read by: Ascencion Dike PA-C Electronically Signed   By: Sandi Mariscal M.D.   On: 02/20/2019 11:17   Dg Chest Port 1 View  Result Date: 02/19/2019 CLINICAL DATA:  Sepsis, vomiting EXAM: PORTABLE CHEST 1 VIEW COMPARISON:  Chest CT 02/01/2019 FINDINGS: Right Port-A-Cath in place with the tip in the SVC. Low lung volumes with bibasilar atelectasis and small effusions, right greater than left. Heart is normal size. No acute bony abnormality. IMPRESSION: Low lung volumes with bibasilar atelectasis and small effusions, right greater than left. Electronically Signed   By: Rolm Baptise M.D.   On: 02/19/2019 23:35   Dg C-arm 1-60 Min-no Report  Result Date: 02/01/2019 Fluoroscopy was utilized by the requesting physician.  No radiographic interpretation.   US Abdomen Limited Ruq  Result Date: 02/20/2019 CLINICAL DATA:  68 y/o F; right upper quadrant abdominal pain with pneumobilia. History of metastatic liver cancer and cholangiocarcinoma. EXAM: ULTRASOUND ABDOMEN LIMITED RIGHT UPPER QUADRANT COMPARISON:  02/06/2019 MRI of the abdomen FINDINGS: Gallbladder: Gallbladder sludge and stones measuring up to 1.9 cm. No gallbladder wall thickening. Negative sonographic Murphy's sign. Common bile duct: Diameter: 5.6 mm.  Common bile duct pneumobilia. Liver: Pneumobilia predominantly in the left lobe. Numerous ill-defined liver masses seen within the right lobe of liver better characterized on the prior MRI of the abdomen. Loculated fluid collections along the surface of the right lobe of liver. Portal vein is patent on color Doppler imaging with normal direction of blood flow towards the liver. IMPRESSION: 1. Intrahepatic and common bile duct pneumobilia. 2. Cholelithiasis and gallbladder sludge. No findings of acute cholecystitis. 3.  Ill-defined masses in right lobe of liver better characterized on prior MRI. 4. Loculated fluid collections along the surface of right lobe of liver. Electronically Signed   By: Kristine Garbe M.D.   On: 02/20/2019 04:49   US Abdomen Limited Ruq  Result Date: 02/01/2019 CLINICAL DATA:  Episodic right upper quadrant pain EXAM: ULTRASOUND ABDOMEN LIMITED RIGHT UPPER QUADRANT COMPARISON:  None. FINDINGS: Gallbladder: Gallbladder sludge and multiple shadowing stones, including a stone at the neck. Gallbladder wall is mildly thickened to 5 mm. No pericholecystic edema. Negative sonographic Murphy sign. Common bile duct: Diameter: 8  mm. Where visualized, no filling defect. There is intrahepatic bile duct dilatation Liver: There are 3 solid appearing liver masses seen in the right liver measuring up to 5.8 cm. Portal vein is patent on color Doppler imaging with normal direction of blood flow towards the liver. IMPRESSION: 1. At least 3 liver masses primarily concerning for metastatic disease. Recommend enhanced abdomen and pelvis CT. 2. Cholelithiasis and sludge including stone at the gallbladder neck or cystic duct. The incompletely visualized common bile duct is also dilated and there is intrahepatic biliary dilatation. There could be obstructing stone or mass, attention on follow-up CT. 3. No suspected cholecystitis. Electronically Signed   By: Monte Fantasia M.D.   On: 02/01/2019 05:48    PERFORMANCE STATUS (ECOG) : 3 - Symptomatic, >50% confined to bed  Review of Systems As noted above. Otherwise, a complete review of systems is negative.  Physical Exam General: Frail-appearing Cardiovascular: regular rate and rhythm Pulmonary: clear ant fields Abdomen: Firm, distended, fluid wave GU: no suprapubic tenderness Extremities: no edema Skin: no rashes Neurological: Weakness, alert but confused  IMPRESSION: Patient seen and examined.  She is alert but confused.  She knew she was in the  hospital but was unable to provide any details regarding her health or her current clinical status.  I was unable to have a meaningful conversation with her regarding her goals.  We will contact daughter and arrange a meeting.  I note that there is a plan for short-term rehab.  I would recommend the patient is followed at SNF by palliative care.  I can also follow patient in the clinic.  Case discussed with Dr. Janese Banks.  PLAN: -Continue current scope of treatment -Will speak with daughter to arrange [place] meeting   Time Total: 30 minutes  Visit consisted of counseling and education dealing with the complex and emotionally intense issues of symptom management and palliative care in the setting of serious and potentially life-threatening illness.Greater than 50%  of this time was spent counseling and coordinating care related to the above assessment and plan.  Signed by: Altha Harm, PhD, NP-C [phone] (Work Cell)

## 2019-02-22 NOTE — Progress Notes (Signed)
   02/22/19 1200  Clinical Encounter Type  Visited With Patient and family together  Visit Type Follow-up  Spiritual Encounters  Spiritual Needs Emotional  Pt requested to complete an AD. Pt understood what the document meant but had a difficulty restating it to other people. Notary deemed that the case was inappropriate for a completion and declined to notarize. Ch stayed to comfort the family and pt. Ch also explained that her daughter by proxy is the pt's HCPOA, which gave the daughter and pt some relief.

## 2019-02-22 NOTE — Clinical Social Work Note (Signed)
CSW spoke with patient's daughter [name] and present bed offers and insurance information. Daughter chose [facility]. CSW notified Claiborne Billings at [facility] of bed acceptance. CSW also started [employer] Bernadene Bell) authorization and faxed clinicals. Insurance reference number is [id number]. CSW will continue to follow for discharge planning.   [name], [place]

## 2019-02-22 NOTE — Clinical Social Work Note (Signed)
Clinical Social Work Assessment  Patient Details  Name: Kaylee Wolf MRN: [id number] Date of Birth: 06/08/1951  Date of referral:  02/22/19               Reason for consult:  Facility Placement                Permission sought to share information with:  Case Manager, Facility Contact Representative, Family Supports Permission granted to share information::  Yes, Verbal Permission Granted  Name::      SNF  Agency::   [place]   Relationship::     Contact Information:     Housing/Transportation Living arrangements for the past 2 months:  Single Family Home Source of Information:  Adult Children Patient Interpreter Needed:  None Criminal Activity/Legal Involvement Pertinent to Current Situation/Hospitalization:  No - Comment as needed Significant Relationships:  Adult Children Lives with:  Adult Children Do you feel safe going back to the place where you live?  Yes Need for family participation in patient care:  Yes (Comment)  Care giving concerns:  Patient lives with daughter in [place]    Social Worker assessment / plan:  CSW consulted for SNF placement. CSW met with patient, [name] and [name] at bedside. Patient is only alert to self and unable to answer any questions. Daughter reports that patient lives with her and they are interested in SNF placement for rehab. CSW explained referral process and bed search. Daughter is in agreement with bed search in [place]. CSW also answered questions regarding insurance. CSW will begin bed search and give offers once received. CSW will follow for discharge planning.   Employment status:  Retired Insurance information:  Managed Medicare PT Recommendations:  Skilled Nursing Facility Information / Referral to community resources:  Skilled Nursing Facility  Patient/Family's Response to care:  Family thanked CSW for assistance   Patient/Family's Understanding of and Emotional Response to  Diagnosis, Current Treatment, and Prognosis:  Family state understanding of current treatment   Emotional Assessment Appearance:  Appears stated age Attitude/Demeanor/Rapport:    Affect (typically observed):    Orientation:  Oriented to Self Alcohol / Substance use:  Not Applicable Psych involvement (Current and /or in the community):  No (Comment)  Discharge Needs  Concerns to be addressed:  Discharge Planning Concerns Readmission within the last 30 days:  Yes Current discharge risk:  Dependent with Mobility Barriers to Discharge:  Continued Medical Work up     , LCSWA 02/22/2019, 2:13 PM  

## 2019-02-22 NOTE — Progress Notes (Signed)
[place] [facility] [place] NAME: Kaylee Wolf    MR#:  [id number]  DATE OF BIRTH:  [birth year]  SUBJECTIVE:   Patient presented to the hospital due to nausea, generalized weakness and suspected to have pneumonia.  Patient is status post ultrasound-guided paracentesis yesterday with 2.5 L of fluid removed.  Patient is somewhat lethargic today, denies any shortness of breath or worsening abdominal distention.  Patient's daughter was at bedside.  REVIEW OF SYSTEMS:    Review of Systems  Constitutional: Negative for chills and fever.  HENT: Negative for congestion and tinnitus.   Eyes: Negative for blurred vision and double vision.  Respiratory: Negative for cough, shortness of breath and wheezing.   Cardiovascular: Negative for chest pain, orthopnea and PND.  Gastrointestinal: Negative for abdominal pain, diarrhea, nausea and vomiting.  Genitourinary: Negative for dysuria and hematuria.  Neurological: Positive for weakness (generalized. ). Negative for dizziness, sensory change and focal weakness.  All other systems reviewed and are negative.    Nutrition: Heart Healthy Tolerating Diet: Yes Tolerating PT: Eval noted   DRUG ALLERGIES:  No Known Allergies  VITALS:  Blood pressure 105/65, pulse (!) 104, temperature 97.9 F (36.6 C), resp. rate 17, height 5\' 5"  (1.651 m), weight 89.7 kg, last menstrual period 05/10/2012, SpO2 94 %.  PHYSICAL EXAMINATION:   Physical Exam  GENERAL:  68 y.o.-year-old patient lying in bed in no acute distress.  EYES: Pupils equal, round, reactive to light and accommodation. No scleral icterus. Extraocular muscles intact.  HEENT: Head atraumatic, normocephalic. Oropharynx and nasopharynx clear.  NECK:  Supple, no jugular venous distention. No thyroid enlargement, no tenderness.  LUNGS: Normal breath sounds bilaterally, no wheezing, rales, rhonchi. No use of accessory muscles of respiration.  CARDIOVASCULAR:  S1, S2 normal. No murmurs, rubs, or gallops.  Right Chest wall port in place ABDOMEN: Soft, nontender, nondistended. Bowel sounds present. No organomegaly or mass.  EXTREMITIES: No cyanosis, clubbing, +1-2 dependent edema b/l.     NEUROLOGIC: Cranial nerves II through XII are intact. No focal Motor or sensory deficits b/l. Globally weak.   PSYCHIATRIC: The patient is alert and oriented x 3.  SKIN: No obvious rash, lesion, or ulcer.    LABORATORY PANEL:   CBC Recent Labs  Lab 02/21/19 0323  WBC 6.9  HGB 9.9*  HCT 32.9*  PLT 505*   ------------------------------------------------------------------------------------------------------------------  Chemistries  Recent Labs  Lab 02/21/19 0323  NA 139  K 3.9  CL 99  CO2 32  GLUCOSE 143*  BUN 10  CREATININE 0.53  CALCIUM 8.0*  AST 28  ALT 12  ALKPHOS 192*  BILITOT 0.8   ------------------------------------------------------------------------------------------------------------------  Cardiac Enzymes Recent Labs  Lab 02/19/19 2256  TROPONINI <0.03   ------------------------------------------------------------------------------------------------------------------  RADIOLOGY:  No results found.   ASSESSMENT AND PLAN:   68 year old female with past medical history of diabetes, anxiety, hypertension, hyperlipidemia, restless leg syndrome, recently diagnosed cholangiocarcinoma who was presented to the hospital due to generalized weakness.  1.  Pneumonia-suspected to be community-acquired pneumonia. -Continue ceftriaxone, Zithromax.  Patient denies any shortness of breath cough or congestion presently. -This was incidentally noted on the CT scan of the chest on admission.  Patient's been afebrile, WBC count is normal.  2.  Generalized weakness-multifactorial in nature related to deconditioning with underlying malignancy and also suspected pneumonia. -Continue antibiotics as mentioned above, patient is status post  ultrasound-guided paracentesis with 2.5 L of fluid removed.  Seen by physical therapy and echo recommend  short-term rehab.  3.  Cholangiocarcinoma-recently diagnosed.  Patient has not initiated treatment yet. - Await further oncology input, will get palliative care consult discuss goals of care given the patient's functional status.  4. DM type II without complication- continue metformin, sliding scale insulin.  5.  History of seizures-no acute seizure activity.  Continue Dilantin/Depakote.  6.  Hyperlipidemia-  Cont. atorvastatin.  7.  Hypothyroidism-continue Synthroid.  8.  History of anxiety/depression-continue Klonopin, Lexapro.   All the records are reviewed and case discussed with Care Management/Social Worker. Management plans discussed with the patient, family and they are in agreement.  CODE STATUS: Full code  DVT Prophylaxis: Lovenox  TOTAL TIME TAKING CARE OF THIS PATIENT: 30 minutes.   POSSIBLE D/C IN 1-2 DAYS, DEPENDING ON CLINICAL CONDITION.   Henreitta Leber M.D on 02/22/2019 at 1:47 PM  Between 7am to 6pm - Pager - [phone]  After 6pm go to [email or web address] - Proofreader  [facility]  Office  [phone]  CC: Primary care physician; Remi Haggard, FNP

## 2019-02-22 NOTE — Plan of Care (Signed)

## 2019-02-22 NOTE — Progress Notes (Signed)
SNF and Non-Emergent EMS Transport Benefits:  Number called: [phone] Rep: Estill Bamberg Reference Number: [id number]  [employer] Gold Plus HMO D-SNP plan with $198 deductible, $0 met.  Out of pocket max is $6700, $0 met as of time of call.     In-network SNF if patient has active Medicaid: $0 copay for days 1-100.  Auth required: [phone].  In-network SNF if patient does not have active Medicaid: $0 copay for days 1-20 and a $178 daily copay for days 21-100.  Once out of pocket reached, covered at 100% for remainder of 100 day benefit period.  Josem Kaufmann is required: [phone].     Non-emergent EMS transport:  -If Active Medicaid - $0 copay -If no active Medicaid coverage - 20% co-insurance for service.   Must be medically necessary.  Josem Kaufmann is not required for service codes [phone] or [phone].

## 2019-02-23 ENCOUNTER — Inpatient Hospital Stay: Payer: Medicare HMO

## 2019-02-23 DIAGNOSIS — C221 Intrahepatic bile duct carcinoma: Secondary | ICD-10-CM

## 2019-02-23 DIAGNOSIS — A419 Sepsis, unspecified organism: Secondary | ICD-10-CM

## 2019-02-23 DIAGNOSIS — R188 Other ascites: Secondary | ICD-10-CM

## 2019-02-23 DIAGNOSIS — R1011 Right upper quadrant pain: Secondary | ICD-10-CM

## 2019-02-23 DIAGNOSIS — R180 Malignant ascites: Secondary | ICD-10-CM

## 2019-02-23 DIAGNOSIS — C787 Secondary malignant neoplasm of liver and intrahepatic bile duct: Secondary | ICD-10-CM

## 2019-02-23 DIAGNOSIS — C786 Secondary malignant neoplasm of retroperitoneum and peritoneum: Secondary | ICD-10-CM

## 2019-02-23 DIAGNOSIS — R0902 Hypoxemia: Secondary | ICD-10-CM

## 2019-02-23 DIAGNOSIS — R140 Abdominal distension (gaseous): Secondary | ICD-10-CM

## 2019-02-23 LAB — GLUCOSE, CAPILLARY
Glucose-Capillary: 146 mg/dL — ABNORMAL HIGH (ref 70–99)
Glucose-Capillary: 125 mg/dL — ABNORMAL HIGH (ref 70–99)
Glucose-Capillary: 126 mg/dL — ABNORMAL HIGH (ref 70–99)
Glucose-Capillary: 130 mg/dL — ABNORMAL HIGH (ref 70–99)

## 2019-02-23 LAB — BASIC METABOLIC PANEL
Anion gap: 6 (ref 5–15)
BUN: 11 mg/dL (ref 8–23)
CALCIUM: 7.7 mg/dL — AB (ref 8.9–10.3)
CO2: 33 mmol/L — ABNORMAL HIGH (ref 22–32)
Chloride: 100 mmol/L (ref 98–111)
Creatinine, Ser: 0.67 mg/dL (ref 0.44–1.00)
GFR calc Af Amer: >60 mL/min (ref >60)
GFR calc non Af Amer: >60 mL/min (ref >60)
Glucose, Bld: 144 mg/dL — ABNORMAL HIGH (ref 70–99)
Potassium: 3.6 mmol/L (ref 3.5–5.1)
SODIUM: 139 mmol/L (ref 135–145)

## 2019-02-23 LAB — CBC
HCT: 32.6 % — ABNORMAL LOW (ref 36.0–46.0)
Hemoglobin: 9.7 g/dL — ABNORMAL LOW (ref 12.0–15.0)
MCH: 26.2 pg (ref 26.0–34.0)
MCHC: 29.8 g/dL — ABNORMAL LOW (ref 30.0–36.0)
MCV: 88.1 fL (ref 80.0–100.0)
NRBC: 0 % (ref 0.0–0.2)
Platelets: 520 10*3/uL — ABNORMAL HIGH (ref 150–400)
RBC: 3.7 MIL/uL — ABNORMAL LOW (ref 3.87–5.11)
RDW: 16.1 % — ABNORMAL HIGH (ref 11.5–15.5)
WBC: 6.7 10*3/uL (ref 4.0–10.5)

## 2019-02-23 LAB — BODY FLUID CULTURE: Culture: NO GROWTH

## 2019-02-23 MED ORDER — AZITHROMYCIN 500 MG PO TABS: 500.0000 mg | ORAL_TABLET | Freq: Every day | ORAL | Status: DC

## 2019-02-23 MED ORDER — LEVOFLOXACIN 500 MG PO TABS: 500.0000 mg | ORAL_TABLET | Freq: Every day | ORAL | 0 refills | Status: AC

## 2019-02-23 MED ORDER — OXYCODONE HCL 5 MG PO TABS: 5.0000 mg | ORAL_TABLET | Freq: Four times a day (QID) | ORAL | 0 refills | Status: DC | PRN

## 2019-02-23 MED ORDER — CLONAZEPAM 0.5 MG PO TABS: 0.5000 mg | ORAL_TABLET | Freq: Every day | ORAL | 0 refills | Status: AC

## 2019-02-23 MED ORDER — OXYCODONE HCL 5 MG PO TABS: 5.0000 mg | ORAL_TABLET | Freq: Four times a day (QID) | ORAL | 0 refills | Status: AC | PRN

## 2019-02-23 NOTE — Progress Notes (Signed)
Hematology/Oncology Consult note [facility]  [phone] [phone]  Patient Care Team: Remi Haggard, FNP as PCP - General (Family Medicine)   Name of the patient: Kaylee Wolf  [id number]  1951-08-03   Date of visit: 02/23/2019   Interval history-she feels better today and is sitting up on her bed eating her lunch.  Plans to go to subacute rehab today  ECOG PS- 2 Pain scale- 0 Opioid associated constipation- no  Review of systems- Review of Systems  Constitutional: Positive for malaise/fatigue. Negative for chills, fever and weight loss.  HENT: Negative for congestion, ear discharge and nosebleeds.   Eyes: Negative for blurred vision.  Respiratory: Negative for cough, hemoptysis, sputum production, shortness of breath and wheezing.   Cardiovascular: Negative for chest pain, palpitations, orthopnea and claudication.  Gastrointestinal: Positive for abdominal pain. Negative for blood in stool, constipation, diarrhea, heartburn, melena, nausea and vomiting.  Genitourinary: Negative for dysuria, flank pain, frequency, hematuria and urgency.  Musculoskeletal: Negative for back pain, joint pain and myalgias.  Skin: Negative for rash.  Neurological: Negative for dizziness, tingling, focal weakness, seizures, weakness and headaches.  Endo/Heme/Allergies: Does not bruise/bleed easily.  Psychiatric/Behavioral: Negative for depression and suicidal ideas. The patient does not have insomnia.      No Known Allergies   Past Medical History:  Diagnosis Date  . Anxiety   . Colon polyp 11/29/2012   at [facility].  . Insomnia   . Mixed hyperlipidemia   . Recurrent major depressive disorder ([place])   . RLS (restless legs syndrome)   . Seizures ([place])   . Tinea pedis of both feet   . Type 2 diabetes mellitus ([place]) 2014?  [name] Varicose veins      Past Surgical History:  Procedure Laterality Date   . COLONOSCOPY  11/29/2012   Dr [name]  . ERCP N/A 02/01/2019   Procedure: ENDOSCOPIC RETROGRADE CHOLANGIOPANCREATOGRAPHY (ERCP);  Surgeon: [name], MD;  Location: [facility] ENDOSCOPY;  Service: Endoscopy;  Laterality: N/A;  . none    . PORTA CATH INSERTION N/A 02/15/2019   Procedure: PORTA CATH INSERTION;  Surgeon: Algernon Huxley, MD;  Location: [facility] LAB;  Service: Cardiovascular;  Laterality: N/A;    Social History   Socioeconomic History  . Marital status: Widowed    Spouse name: Not on file  . Number of children: 1  . Years of education: Not on file  . Highest education level: Not on file  Occupational History  . Not on file  Social Needs  . Financial resource strain: Patient refused  . Food insecurity:    Worry: Patient refused    Inability: Patient refused  . Transportation needs:    Medical: Patient refused    Non-medical: Patient refused  Tobacco Use  . Smoking status: Never Smoker  . Smokeless tobacco: Never Used  Substance and Sexual Activity  . Alcohol use: No    Alcohol/week: 0.0 standard drinks  . Drug use: No  . Sexual activity: Not Currently  Lifestyle  . Physical activity:    Days per week: Patient refused    Minutes per session: Patient refused  . Stress: Patient refused  Relationships  . Social connections:    Talks on phone: Patient refused    Gets together: Patient refused    Attends religious service: Patient refused    Active member of club or organization: Patient refused    Attends meetings of clubs or  organizations: Patient refused    Relationship status: Patient refused  . Intimate partner violence:    Fear of current or ex partner: Patient refused    Emotionally abused: Patient refused    Physically abused: Patient refused    Forced sexual activity: Patient refused  Other Topics Concern  . Not on file  Social History Narrative   Lives with daughter, karen    Family History  Problem Relation Age of Onset  . Diabetes Mother     . Heart disease Mother   . Cancer Father   . Diabetes Brother   . Hypertension Brother   . Stroke Brother   . Hypertension Brother   . Breast cancer Neg Hx      Current Facility-Administered Medications:  .  0.9 %  sodium chloride infusion, 250 mL, Intravenous, PRN, Dustin Flock, MD .  acetaminophen (TYLENOL) tablet 650 mg, 650 mg, Oral, Q6H PRN **OR** acetaminophen (TYLENOL) suppository 650 mg, 650 mg, Rectal, Q6H PRN, Dustin Flock, MD .  aspirin EC tablet 81 mg, 81 mg, Oral, Daily, Dustin Flock, MD, 81 mg at 02/23/19 1007 .  atorvastatin (LIPITOR) tablet 20 mg, 20 mg, Oral, Daily, Dustin Flock, MD, 20 mg at 02/23/19 1008 .  [START ON 02/24/2019] azithromycin (ZITHROMAX) tablet 500 mg, 500 mg, Oral, Daily, Dallie Piles, RPH .  Brexpiprazole TABS 3 mg, 3 mg, Oral, q morning - 10a, Dustin Flock, MD, 3 mg at 02/23/19 1006 .  cefTRIAXone (ROCEPHIN) 1 g in sodium chloride 0.9 % 100 mL IVPB, 1 g, Intravenous, Q24H, Dustin Flock, MD, Last Rate: 200 mL/hr at 02/23/19 1008, 1 g at 02/23/19 1008 .  cholecalciferol (VITAMIN D) tablet 1,000 Units, 1,000 Units, Oral, Daily, Dustin Flock, MD, 1,000 Units at 02/23/19 1006 .  clonazePAM (KLONOPIN) tablet 0.5 mg, 0.5 mg, Oral, QHS, Dustin Flock, MD, 0.5 mg at 02/22/19 2119 .  divalproex (DEPAKOTE) DR tablet 250 mg, 250 mg, Oral, Q12H, Dustin Flock, MD, 250 mg at 02/23/19 1007 .  enoxaparin (LOVENOX) injection 40 mg, 40 mg, Subcutaneous, Q24H, Dustin Flock, MD, 40 mg at 02/22/19 1431 .  escitalopram (LEXAPRO) tablet 20 mg, 20 mg, Oral, Daily, Dustin Flock, MD, 20 mg at 02/23/19 1007 .  furosemide (LASIX) tablet 40 mg, 40 mg, Oral, Daily, Dustin Flock, MD, 40 mg at 02/23/19 1008 .  gabapentin (NEURONTIN) capsule 400 mg, 400 mg, Oral, BID, Dustin Flock, MD, 400 mg at 02/23/19 1008 .  insulin aspart (novoLOG) injection 0-9 Units, 0-9 Units, Subcutaneous, TID WC, Dustin Flock, MD, 2 Units at 02/22/19 1245 .   lamoTRIgine (LAMICTAL) tablet 150 mg, 150 mg, Oral, BID, Dustin Flock, MD, 150 mg at 02/23/19 1007 .  levothyroxine (SYNTHROID, LEVOTHROID) tablet 50 mcg, 50 mcg, Oral, QAC breakfast, Dustin Flock, MD, 50 mcg at 02/23/19 [phone] .  metFORMIN (GLUCOPHAGE) tablet 500 mg, 500 mg, Oral, BID WC, Dustin Flock, MD, 500 mg at 02/23/19 1006 .  multivitamin with minerals tablet 1 tablet, 1 tablet, Oral, Daily, Dustin Flock, MD, 1 tablet at 02/23/19 1008 .  ondansetron (ZOFRAN) tablet 4 mg, 4 mg, Oral, Q6H PRN **OR** ondansetron (ZOFRAN) injection 4 mg, 4 mg, Intravenous, Q6H PRN, Dustin Flock, MD, 4 mg at 02/21/19 1745 .  oxyCODONE (Oxy IR/ROXICODONE) immediate release tablet 5 mg, 5 mg, Oral, Q6H PRN, Dustin Flock, MD .  pantoprazole (PROTONIX) EC tablet 40 mg, 40 mg, Oral, Daily, Dustin Flock, MD, 40 mg at 02/23/19 1008 .  phenytoin (DILANTIN) ER capsule 100 mg, 100 mg, Oral, BID,  Dustin Flock, MD, 100 mg at 02/23/19 1007 .  polyethylene glycol (MIRALAX / GLYCOLAX) packet 17 g, 17 g, Oral, Daily PRN, Dustin Flock, MD .  sodium chloride 0.9 % bolus 1,000 mL, 1,000 mL, Intravenous, Once, Nena Polio, MD, Stopped at 02/19/19 2323 .  sodium chloride flush (NS) 0.9 % injection 3 mL, 3 mL, Intravenous, Once, Nena Polio, MD .  sodium chloride flush (NS) 0.9 % injection 3 mL, 3 mL, Intravenous, Q12H, Dustin Flock, MD, 3 mL at 02/22/19 2122 .  sodium chloride flush (NS) 0.9 % injection 3 mL, 3 mL, Intravenous, PRN, Dustin Flock, MD  Physical exam:  Vitals:   02/22/19 0458 02/22/19 1623 02/22/19 2120 02/23/19 0553  BP: 105/65 111/67 100/70 114/61  Pulse: (!) 104 (!) 108 97 (!) 105  Resp: 17 14 17 19   Temp: 97.9 F (36.6 C) 99.1 F (37.3 C) (!) 97.5 F (36.4 C) 98.9 F (37.2 C)  TempSrc:  Oral Oral   SpO2: 94% 93% 96% 97%  Weight:      Height:       Physical Exam Constitutional:      Appearance: She is obese.  HENT:     Head: Normocephalic and atraumatic.  Eyes:      Pupils: Pupils are equal, round, and reactive to light.  Neck:     Musculoskeletal: Normal range of motion.  Cardiovascular:     Rate and Rhythm: Normal rate and regular rhythm.     Heart sounds: Normal heart sounds.  Pulmonary:     Effort: Pulmonary effort is normal.     Breath sounds: Normal breath sounds.  Abdominal:     General: Bowel sounds are normal. There is distension.     Palpations: Abdomen is soft.  Skin:    General: Skin is warm and dry.  Neurological:     Mental Status: She is alert and oriented to person, place, and time.      CMP Latest Ref Rng & Units 02/23/2019  Glucose 70 - 99 mg/dL 144(H)  BUN 8 - 23 mg/dL 11  Creatinine 0.44 - 1.00 mg/dL 0.67  Sodium 135 - 145 mmol/L 139  Potassium 3.5 - 5.1 mmol/L 3.6  Chloride 98 - 111 mmol/L 100  CO2 22 - 32 mmol/L 33(H)  Calcium 8.9 - 10.3 mg/dL 7.7(L)  Total Protein 6.5 - 8.1 g/dL -  Total Bilirubin 0.3 - 1.2 mg/dL -  Alkaline Phos 38 - 126 U/L -  AST 15 - 41 U/L -  ALT 0 - 44 U/L -   CBC Latest Ref Rng & Units 02/23/2019  WBC 4.0 - 10.5 K/uL 6.7  Hemoglobin 12.0 - 15.0 g/dL 9.7(L)  Hematocrit 36.0 - 46.0 % 32.6(L)  Platelets 150 - 400 K/uL 520(H)    @IMAGES @  Ct Chest W Contrast  Result Date: 02/01/2019 CLINICAL DATA:  Cough with persistent shortness of breath. EXAM: CT CHEST, ABDOMEN, AND PELVIS WITH CONTRAST TECHNIQUE: Multidetector CT imaging of the chest, abdomen and pelvis was performed following the standard protocol during bolus administration of intravenous contrast. CONTRAST:  126mL ISOVUE-300 IOPAMIDOL (ISOVUE-300) INJECTION 61% COMPARISON:  Right upper quadrant ultrasound earlier today FINDINGS: CT CHEST FINDINGS Cardiovascular: Normal heart size. No pericardial effusion. Coronary atherosclerosis. Mild aortic atherosclerosis. Mediastinum/Nodes: Negative for adenopathy. Lungs/Pleura: Mild dependent atelectasis. No consolidation or nodularity Musculoskeletal: No acute or aggressive finding.  Generalized spondylosis. CT ABDOMEN PELVIS FINDINGS Hepatobiliary: 4 or 5 indistinct liver masses centered on the right, measuring up to 2.3 cm. There  is cholelithiasis and gallbladder wall thickening with mild fat indistinctness. Murphy sign was negative on prior ultrasound. There is intrahepatic biliary duct dilatation above a circumferentially thickened common bile duct. Small subcapsular fluid collection along the right liver without serosal enhancement, favor loculated peritoneal fluid. Pancreas: No evident mass or inflammation Spleen: Eggshell calcification at the hilum measuring 2.3 cm, compatible with remote insult. Adrenals/Urinary Tract: Negative adrenals. No hydronephrosis or stone. Negative bladder. Stomach/Bowel: No evident obstruction, inflammation, or mass. Vascular/Lymphatic: Prominent but not strictly enlarged lymph nodes in the deep liver drainage. Reproductive: Negative Other: Small volume ascites. Nodular appearance of the omentum including omentum that is herniated through a fatty umbilical hernia. Musculoskeletal: No acute or aggressive finding. Advanced lumbar facet degeneration. IMPRESSION: 1. 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 Please correlate for cholangitis symptoms. 2. Indistinct right liver masses, favor metastatic disease over early biliary abscesses. 3. Nodular omentum most consistent with peritoneal carcinomatosis. 4. Cholelithiasis. 5. No acute or malignant finding in the chest. Electronically Signed   By: Monte Fantasia M.D.   On: 02/01/2019 06:34   Ct Angio Chest Pe W And/or Wo Contrast  Result Date: 02/20/2019 CLINICAL DATA:  68 y/o F; PE suspected, intermediate prob, positive D-dimer. EXAM: CT ANGIOGRAPHY CHEST WITH CONTRAST TECHNIQUE: Multidetector CT imaging of the chest was performed using the standard protocol during bolus administration of  intravenous contrast. Multiplanar CT image reconstructions and MIPs were obtained to evaluate the vascular anatomy. CONTRAST:  One hundred thirty-five OMNIPAQUE IOHEXOL 350 MG/ML SOLN COMPARISON:  02/01/2019 CT chest, abdomen pelvis. FINDINGS: Cardiovascular: Multiple attempted acquisitions. Motion artifact. Satisfactory opacification of the pulmonary arteries to the segmental level. No pulmonary embolus identified. Satisfactory opacification of the thoracic aorta no aortic dissection or aneurysm. Mild cardiomegaly heart size. No pericardial effusion. Right port catheter tip projects over lower SVC. Coronary artery and aortic calcific atherosclerosis. Mediastinum/Nodes: No enlarged mediastinal, hilar, or axillary lymph nodes. Thyroid gland, trachea, and esophagus demonstrate no significant findings. Lungs/Pleura: Small right and trace left pleural effusions. Platelike atelectasis in the left lower lobe. Small area of consolidation the base of right lower lobe. No pneumothorax. Few clustered 2-3 mm nodules at the right lung apex are stable. Upper Abdomen: Increased ascites in the upper abdomen with loculations along the liver surface as well as pneumobilia, incompletely visualized. Musculoskeletal: No chest wall abnormality. No acute or significant osseous findings. Review of the MIP images confirms the above findings. IMPRESSION: 1. Motion artifact. No pulmonary embolus identified. 2. Small right and trace left pleural effusions. 3. Small area of consolidation in the right lower lobe may represent pneumonia or atelectasis. 4. Coronary artery and aortic calcific atherosclerosis. 5. Increased ascites in the upper abdomen with loculations along the liver surface as well as pneumobilia, incompletely visualized. Electronically Signed   By: Kristine Garbe M.D.   On: 02/20/2019 01:14   Ct Abdomen Pelvis W Contrast  Result Date: 02/01/2019 CLINICAL DATA:  Cough with persistent shortness of breath. EXAM: CT  CHEST, ABDOMEN, AND PELVIS WITH CONTRAST TECHNIQUE: Multidetector CT imaging of the chest, abdomen and pelvis was performed following the standard protocol during bolus administration of intravenous contrast. CONTRAST:  166mL ISOVUE-300 IOPAMIDOL (ISOVUE-300) INJECTION 61% COMPARISON:  Right upper quadrant ultrasound earlier today FINDINGS: CT CHEST FINDINGS Cardiovascular: Normal heart size. No pericardial effusion. Coronary atherosclerosis. Mild aortic atherosclerosis. Mediastinum/Nodes: Negative for adenopathy. Lungs/Pleura: Mild dependent atelectasis. No consolidation or nodularity Musculoskeletal: No acute or aggressive finding.  Generalized spondylosis. CT ABDOMEN PELVIS FINDINGS Hepatobiliary: 4 or 5 indistinct liver masses centered on the right, measuring up to 2.3 cm. There is cholelithiasis and gallbladder wall thickening with mild fat indistinctness. Murphy sign was negative on prior ultrasound. There is intrahepatic biliary duct dilatation above a circumferentially thickened common bile duct. Small subcapsular fluid collection along the right liver without serosal enhancement, favor loculated peritoneal fluid. Pancreas: No evident mass or inflammation Spleen: Eggshell calcification at the hilum measuring 2.3 cm, compatible with remote insult. Adrenals/Urinary Tract: Negative adrenals. No hydronephrosis or stone. Negative bladder. Stomach/Bowel: No evident obstruction, inflammation, or mass. Vascular/Lymphatic: Prominent but not strictly enlarged lymph nodes in the deep liver drainage. Reproductive: Negative Other: Small volume ascites. Nodular appearance of the omentum including omentum that is herniated through a fatty umbilical hernia. Musculoskeletal: No acute or aggressive finding. Advanced lumbar facet degeneration. IMPRESSION: 1. 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 Please  correlate for cholangitis symptoms. 2. Indistinct right liver masses, favor metastatic disease over early biliary abscesses. 3. Nodular omentum most consistent with peritoneal carcinomatosis. 4. Cholelithiasis. 5. No acute or malignant finding in the chest. Electronically Signed   By: Monte Fantasia M.D.   On: 02/01/2019 06:34   Mr Liver W Wo Contrast  Result Date: 02/06/2019 CLINICAL DATA:  68 year old female with history of abnormal CT scan concerning for potential cholangiocarcinoma. Follow-up study. EXAM: MRI ABDOMEN WITHOUT AND WITH CONTRAST TECHNIQUE: Multiplanar multisequence MR imaging of the abdomen was performed both before and after the administration of intravenous contrast. CONTRAST:  8 mL of Gadavist. COMPARISON:  No prior abdominal MRI. CT the chest, abdomen and pelvis 02/01/2019. FINDINGS: Comment: Today's study is limited by considerable patient respiratory motion. Lower chest: T2 signal intensity dependently in the lower right hemithorax, compatible with a small to moderate right pleural effusion, likely with some associated passive atelectasis in the right lower lobe. Hepatobiliary: There are multiple hepatic lesions predominantly throughout the right lobe of the liver. The largest of these is in segment 6 (axial image 26 of series 5) measuring 2.4 x 3.7 cm. These lesions are all T1 hypointense, heterogeneously T2 hyperintense, demonstrate diffusion restriction, and are hypovascular with peripheral enhancement on post gadolinium imaging, highly concerning for metastatic lesions. MRCP imaging was not performed. However, the previously noted intrahepatic biliary ductal dilatation evident on CT examination 02/01/2019 is no longer noted on today's study. Mild T2 hyperintensity in the periportal aspects of the hepatic parenchyma, suggesting periportal edema. There is a subtle signal void in the common bile duct, likely reflective of an indwelling common bile duct stent. Some dependent T2  hypointensity and multiple well-defined rounded T2 hypo intensities are noted within the gallbladder, compatible with a combination of biliary sludge and gallstones. Gallbladder does not appear distended. Gallbladder wall does not appear thickened. Pancreas: No pancreatic mass. No pancreatic ductal dilatation. No pancreatic or peripancreatic fluid or inflammatory changes. Spleen: Well-defined T1 hypointense, T2 hyperintense, nonenhancing lesion in the anterior aspect of the spleen, corresponding to rim calcified structure on recent CT examination, likely sequela of remote splenic trauma. Spleen is otherwise unremarkable in appearance. Adrenals/Urinary Tract: Bilateral kidneys and adrenal glands are normal in appearance. No hydroureteronephrosis in the visualized portions of the abdomen. Stomach/Bowel: Visualized portions are unremarkable. Vascular/Lymphatic: No aneurysm identified in the visualized abdominal vasculature. Mildly enlarged portacaval lymph node measuring up to 1.6 cm in short axis (axial image forty-nine of series 13). Other:  Small volume  of ascites most evident inferior to the liver. Musculoskeletal: No aggressive appearing osseous lesions are noted in the visualized portions of the skeleton. IMPRESSION: 1. Multiple malignant appearing lesions throughout the right lobe of the liver, most likely to reflect metastatic lesions. This is associated with some portacaval lymphadenopathy. 2. Previously noted intrahepatic biliary ductal dilatation has resolved following placement of common bile duct stent and sphincterotomy. 3. Cholelithiasis and biliary sludge in the gallbladder. No findings to suggest an acute cholecystitis at this time. 4. Trace volume of ascites. Electronically Signed   By: Vinnie Langton M.D.   On: 02/06/2019 06:42   US Abdomen Limited  Result Date: 02/23/2019 CLINICAL DATA:  Ascites, abdominal distension, assess for paracentesis EXAM: LIMITED ABDOMEN ULTRASOUND FOR ASCITES  TECHNIQUE: Limited ultrasound survey for ascites was performed in all four abdominal quadrants. COMPARISON:  02/20/2019 FINDINGS: Very small amount of left lower quadrant ascites, not enough to warrant therapeutic paracentesis. Remainder of the abdomen demonstrates no significant ascites. Procedure not performed. IMPRESSION: Small amount of left lower quadrant ascites by ultrasound. Electronically Signed   By: Jerilynn Mages.  Shick M.D.   On: 02/23/2019 09:50   US Paracentesis  Result Date: 02/20/2019 INDICATION: Cholangiocarcinoma. Abdominal distention and ascites. Request for diagnostic and therapeutic paracentesis. EXAM: ULTRASOUND GUIDED LEFT LOWER QUADRANT PARACENTESIS MEDICATIONS: None. COMPLICATIONS: None immediate. PROCEDURE: Informed written consent was obtained from the patient after a discussion of the risks, benefits and alternatives to treatment. A timeout was performed prior to the initiation of the procedure. Initial ultrasound scanning demonstrates a moderate amount of ascites within the left lower abdominal quadrant. The left lower abdomen was prepped and draped in the usual sterile fashion. 1% lidocaine with epinephrine was used for local anesthesia. Following this, a 6 Fr Safe-T-Centesis catheter was introduced. An ultrasound image was saved for documentation purposes. The paracentesis was performed. The catheter was removed and a dressing was applied. The patient tolerated the procedure well without immediate post procedural complication. FINDINGS: A total of approximately 2.8 L of clear yellow fluid was removed. Samples were sent to the laboratory as requested by the clinical team. IMPRESSION: Successful ultrasound-guided paracentesis yielding 2.8 liters of peritoneal fluid. Read by: Ascencion Dike PA-C Electronically Signed   By: Sandi Mariscal M.D.   On: 02/20/2019 11:17   Dg Chest Port 1 View  Result Date: 02/19/2019 CLINICAL DATA:  Sepsis, vomiting EXAM: PORTABLE CHEST 1 VIEW COMPARISON:  Chest CT  02/01/2019 FINDINGS: Right Port-A-Cath in place with the tip in the SVC. Low lung volumes with bibasilar atelectasis and small effusions, right greater than left. Heart is normal size. No acute bony abnormality. IMPRESSION: Low lung volumes with bibasilar atelectasis and small effusions, right greater than left. Electronically Signed   By: Rolm Baptise M.D.   On: 02/19/2019 23:35   Dg C-arm 1-60 Min-no Report  Result Date: 02/01/2019 Fluoroscopy was utilized by the requesting physician.  No radiographic interpretation.   US Abdomen Limited Ruq  Result Date: 02/20/2019 CLINICAL DATA:  68 y/o F; right upper quadrant abdominal pain with pneumobilia. History of metastatic liver cancer and cholangiocarcinoma. EXAM: ULTRASOUND ABDOMEN LIMITED RIGHT UPPER QUADRANT COMPARISON:  02/06/2019 MRI of the abdomen FINDINGS: Gallbladder: Gallbladder sludge and stones measuring up to 1.9 cm. No gallbladder wall thickening. Negative sonographic Murphy's sign. Common bile duct: Diameter: 5.6 mm.  Common bile duct pneumobilia. Liver: Pneumobilia predominantly in the left lobe. Numerous ill-defined liver masses seen within the right lobe of liver better characterized on the prior MRI of the abdomen.  Loculated fluid collections along the surface of the right lobe of liver. Portal vein is patent on color Doppler imaging with normal direction of blood flow towards the liver. IMPRESSION: 1. Intrahepatic and common bile duct pneumobilia. 2. Cholelithiasis and gallbladder sludge. No findings of acute cholecystitis. 3. Ill-defined masses in right lobe of liver better characterized on prior MRI. 4. Loculated fluid collections along the surface of right lobe of liver. Electronically Signed   By: Kristine Garbe M.D.   On: 02/20/2019 04:49   US Abdomen Limited Ruq  Result Date: 02/01/2019 CLINICAL DATA:  Episodic right upper quadrant pain EXAM: ULTRASOUND ABDOMEN LIMITED RIGHT UPPER QUADRANT COMPARISON:  None. FINDINGS:  Gallbladder: Gallbladder sludge and multiple shadowing stones, including a stone at the neck. Gallbladder wall is mildly thickened to 5 mm. No pericholecystic edema. Negative sonographic Murphy sign. Common bile duct: Diameter: 8 mm. Where visualized, no filling defect. There is intrahepatic bile duct dilatation Liver: There are 3 solid appearing liver masses seen in the right liver measuring up to 5.8 cm. Portal vein is patent on color Doppler imaging with normal direction of blood flow towards the liver. IMPRESSION: 1. At least 3 liver masses primarily concerning for metastatic disease. Recommend enhanced abdomen and pelvis CT. 2. Cholelithiasis and sludge including stone at the gallbladder neck or cystic duct. The incompletely visualized common bile duct is also dilated and there is intrahepatic biliary dilatation. There could be obstructing stone or mass, attention on follow-up CT. 3. No suspected cholecystitis. Electronically Signed   By: Monte Fantasia M.D.   On: 02/01/2019 05:48     Assessment and plan- Patient is a 68 y.o. 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  Patient was supposed to get chemotherapy this week but she felt weak and short of breath and hence was admitted to the hospital.  Discussed with the daughter that giving her a trial of rehab is reasonable to see what her quality of life and performance status is at the end of the rehab..  If she fails to improve and a performance status remains poor then hospice is certainly a consideration at that time.  If however she has a meaningful improvement in her performance status, perhaps palliative chemotherapy could be considered.  I appreciate input from [facility] as well.  Oncology and palliative care will work closely with this patient as an outpatient and continue to address goals of care.  Anemia and thrombocytosis  likely secondary to underlying malignancy.  Continue to monitor   Visit Diagnosis 1. Hypoxia   2. Sepsis ([place])   3. Community acquired pneumonia, unspecified laterality   4. RUQ pain   5. Abdominal distension   6. Ascites      Dr. Randa Evens, MD, MPH [facility] at [facility] [phone] 02/23/2019 1:57 PM

## 2019-02-23 NOTE — Progress Notes (Addendum)
[place]  [phone] [phone]   Name: Kaylee Wolf Date: 02/23/2019 MRN: [id number]  DOB: 02-Nov-1951  Patient Care Team: Remi Haggard, FNP as PCP - General (Family Medicine)    REASON FOR CONSULTATION: Palliative Care consult requested for this 68 y.o. 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  Patient was found to have possible pneumonia and worsening ascites.  Patient is status post paracentesis with 2.5 L of fluid removed.  She has had some lethargy and confusion.  Palliative care was consulted to help address goals.   CODE STATUS: Full code  PAST MEDICAL HISTORY: Past Medical History:  Diagnosis Date  . Anxiety   . Colon polyp 11/29/2012   at [place].  . Insomnia   . Mixed hyperlipidemia   . Recurrent major depressive disorder ([place])   . RLS (restless legs syndrome)   . Seizures ([place])   . Tinea pedis of both feet   . Type 2 diabetes mellitus ([place]) 2014?  [name] Varicose veins     PAST SURGICAL HISTORY:  Past Surgical History:  Procedure Laterality Date  . COLONOSCOPY  11/29/2012   Dr [name]  . ERCP N/A 02/01/2019   Procedure: ENDOSCOPIC RETROGRADE CHOLANGIOPANCREATOGRAPHY (ERCP);  Surgeon: [name], MD;  Location: [facility] ENDOSCOPY;  Service: Endoscopy;  Laterality: N/A;  . none    . PORTA CATH INSERTION N/A 02/15/2019   Procedure: PORTA CATH INSERTION;  Surgeon: Algernon Huxley, MD;  Location: [facility] LAB;  Service: Cardiovascular;  Laterality: N/A;    HEMATOLOGY/ONCOLOGY HISTORY:    Cholangiocarcinoma metastatic to liver ([place])   02/08/2019 Cancer Staging    Staging form: Distal Bile Duct, AJCC 8th Edition - Clinical stage from 02/08/2019: Stage IV (cTX, cN1, cM1) - Signed by  Sindy Guadeloupe, MD on 02/12/2019    02/12/2019 Initial Diagnosis    Cholangiocarcinoma metastatic to liver ([place])    02/22/2019 -  Chemotherapy    The patient had palonosetron (ALOXI) injection 0.25 mg, 0.25 mg, Intravenous,  Once, 0 of 4 cycles CISplatin (PLATINOL) 52 mg in sodium chloride 0.9 % 250 mL chemo infusion, 25 mg/m2, Intravenous,  Once, 0 of 4 cycles gemcitabine (GEMZAR) 2,052 mg in sodium chloride 0.9 % 250 mL chemo infusion, 1,000 mg/m2, Intravenous,  Once, 0 of 4 cycles fosaprepitant (EMEND) 150 mg, dexamethasone (DECADRON) 12 mg in sodium chloride 0.9 % 145 mL IVPB, , Intravenous,  Once, 0 of 4 cycles  for chemotherapy treatment.      ALLERGIES:  has No Known Allergies.  MEDICATIONS:  Current Facility-Administered Medications  Medication Dose Route Frequency Provider Last Rate Last Dose  . 0.9 %  sodium chloride infusion  250 mL Intravenous PRN Dustin Flock, MD      . acetaminophen (TYLENOL) tablet 650 mg  650 mg Oral Q6H PRN Dustin Flock, MD       Or  . acetaminophen (TYLENOL) suppository 650 mg  650 mg Rectal Q6H PRN Dustin Flock, MD      . aspirin EC tablet 81 mg  81 mg Oral Daily Dustin Flock, MD   81 mg at 02/22/19 1035  . atorvastatin (LIPITOR) tablet 20 mg  20 mg Oral Daily Dustin Flock, MD   20 mg at 02/22/19 1035  . azithromycin (ZITHROMAX) 500 mg in sodium chloride 0.9 %  250 mL IVPB  500 mg Intravenous Q24H Dustin Flock, MD   Stopped at 02/22/19 1246  . Brexpiprazole TABS 3 mg  3 mg Oral q morning - 10a Dustin Flock, MD   3 mg at 02/22/19 1505  . cefTRIAXone (ROCEPHIN) 1 g in sodium chloride 0.9 % 100 mL IVPB  1 g Intravenous Q24H Dustin Flock, MD   Stopped at 02/22/19 1117  . cholecalciferol (VITAMIN D) tablet 1,000 Units  1,000 Units Oral Daily Dustin Flock, MD   1,000 Units at 02/22/19 1035  . clonazePAM (KLONOPIN) tablet 0.5 mg  0.5 mg Oral QHS Dustin Flock, MD   0.5 mg at 02/22/19 2119  . divalproex (DEPAKOTE) DR tablet 250 mg   250 mg Oral Q12H Dustin Flock, MD   250 mg at 02/22/19 2121  . enoxaparin (LOVENOX) injection 40 mg  40 mg Subcutaneous Q24H Dustin Flock, MD   40 mg at 02/22/19 1431  . escitalopram (LEXAPRO) tablet 20 mg  20 mg Oral Daily Dustin Flock, MD   20 mg at 02/22/19 1120  . furosemide (LASIX) tablet 40 mg  40 mg Oral Daily Dustin Flock, MD   40 mg at 02/22/19 1035  . gabapentin (NEURONTIN) capsule 400 mg  400 mg Oral BID Dustin Flock, MD   400 mg at 02/22/19 2119  . insulin aspart (novoLOG) injection 0-9 Units  0-9 Units Subcutaneous TID WC Dustin Flock, MD   2 Units at 02/22/19 1245  . lamoTRIgine (LAMICTAL) tablet 150 mg  150 mg Oral BID Dustin Flock, MD   150 mg at 02/22/19 2120  . levothyroxine (SYNTHROID, LEVOTHROID) tablet 50 mcg  50 mcg Oral QAC breakfast Dustin Flock, MD   50 mcg at 02/23/19 [phone]  . metFORMIN (GLUCOPHAGE) tablet 500 mg  500 mg Oral BID WC Dustin Flock, MD   500 mg at 02/22/19 1732  . multivitamin with minerals tablet 1 tablet  1 tablet Oral Daily Dustin Flock, MD   1 tablet at 02/22/19 1119  . ondansetron (ZOFRAN) tablet 4 mg  4 mg Oral Q6H PRN Dustin Flock, MD       Or  . ondansetron [facility]) injection 4 mg  4 mg Intravenous Q6H PRN Dustin Flock, MD   4 mg at 02/21/19 1745  . oxyCODONE (Oxy IR/ROXICODONE) immediate release tablet 5 mg  5 mg Oral Q6H PRN Dustin Flock, MD      . pantoprazole (PROTONIX) EC tablet 40 mg  40 mg Oral Daily Dustin Flock, MD   40 mg at 02/22/19 1034  . phenytoin (DILANTIN) ER capsule 100 mg  100 mg Oral BID Dustin Flock, MD   100 mg at 02/22/19 2121  . polyethylene glycol (MIRALAX / GLYCOLAX) packet 17 g  17 g Oral Daily PRN Dustin Flock, MD      . sodium chloride 0.9 % bolus 1,000 mL  1,000 mL Intravenous Once Nena Polio, MD   Stopped at 02/19/19 2323  . sodium chloride flush (NS) 0.9 % injection 3 mL  3 mL Intravenous Once Nena Polio, MD      . sodium chloride flush (NS) 0.9 % injection 3 mL   3 mL Intravenous Q12H Dustin Flock, MD   3 mL at 02/22/19 2122  . sodium chloride flush (NS) 0.9 % injection 3 mL  3 mL Intravenous PRN Dustin Flock, MD        VITAL SIGNS: BP 114/61 (BP Location: Left Arm)   Pulse (!) 105   Temp 98.9 F (37.2  C)   Resp 19   Ht 5\' 5"  (1.651 m)   Wt 197 lb 12 oz (89.7 kg)   LMP 05/10/2012   SpO2 97%   BMI 32.91 kg/m  Filed Weights   02/19/19 2236  Weight: 197 lb 12 oz (89.7 kg)    Estimated body mass index is 32.91 kg/m as calculated from the following:   Height as of this encounter: 5\' 5"  (1.651 m).   Weight as of this encounter: 197 lb 12 oz (89.7 kg).  LABS: CBC:    Component Value Date/Time   WBC 6.7 02/23/2019 0353   HGB 9.7 (L) 02/23/2019 0353   HGB 13.9 04/17/2015 2020   HCT 32.6 (L) 02/23/2019 0353   HCT 41.1 04/17/2015 2020   PLT 520 (H) 02/23/2019 0353   PLT 196 04/17/2015 2020   MCV 88.1 02/23/2019 0353   MCV 89 04/17/2015 2020   NEUTROABS 5.9 02/19/2019 2256   LYMPHSABS 0.8 02/19/2019 2256   MONOABS 1.2 (H) 02/19/2019 2256   EOSABS 0.0 02/19/2019 2256   BASOSABS 0.1 02/19/2019 2256   Comprehensive Metabolic Panel:    Component Value Date/Time   NA 139 02/23/2019 0353   NA 142 04/17/2015 2020   K 3.6 02/23/2019 0353   K 3.8 04/17/2015 2020   CL 100 02/23/2019 0353   CL 107 04/17/2015 2020   CO2 33 (H) 02/23/2019 0353   CO2 29 04/17/2015 2020   BUN 11 02/23/2019 0353   BUN 17 04/17/2015 2020   CREATININE 0.67 02/23/2019 0353   CREATININE 0.72 09/14/2016 1504   GLUCOSE 144 (H) 02/23/2019 0353   GLUCOSE 149 (H) 04/17/2015 2020   CALCIUM 7.7 (L) 02/23/2019 0353   CALCIUM 9.2 04/17/2015 2020   AST 28 02/21/2019 0323   AST 20 04/17/2015 2020   ALT 12 02/21/2019 0323   ALT 14 04/17/2015 2020   ALKPHOS 192 (H) 02/21/2019 0323   ALKPHOS 108 04/17/2015 2020   BILITOT 0.8 02/21/2019 0323   BILITOT 0.2 (L) 04/17/2015 2020   PROT 5.3 (L) 02/21/2019 0323   PROT 7.1 04/17/2015 2020   ALBUMIN 1.7 (L) 02/21/2019  0323   ALBUMIN 4.4 04/17/2015 2020    RADIOGRAPHIC STUDIES: Ct Chest W Contrast  Result Date: 02/01/2019 CLINICAL DATA:  Cough with persistent shortness of breath. EXAM: CT CHEST, ABDOMEN, AND PELVIS WITH CONTRAST TECHNIQUE: Multidetector CT imaging of the chest, abdomen and pelvis was performed following the standard protocol during bolus administration of intravenous contrast. CONTRAST:  129mL ISOVUE-300 IOPAMIDOL (ISOVUE-300) INJECTION 61% COMPARISON:  Right upper quadrant ultrasound earlier today FINDINGS: CT CHEST FINDINGS Cardiovascular: Normal heart size. No pericardial effusion. Coronary atherosclerosis. Mild aortic atherosclerosis. Mediastinum/Nodes: Negative for adenopathy. Lungs/Pleura: Mild dependent atelectasis. No consolidation or nodularity Musculoskeletal: No acute or aggressive finding. Generalized spondylosis. CT ABDOMEN PELVIS FINDINGS Hepatobiliary: 4 or 5 indistinct liver masses centered on the right, measuring up to 2.3 cm. There is cholelithiasis and gallbladder wall thickening with mild fat indistinctness. Murphy sign was negative on prior ultrasound. There is intrahepatic biliary duct dilatation above a circumferentially thickened common bile duct. Small subcapsular fluid collection along the right liver without serosal enhancement, favor loculated peritoneal fluid. Pancreas: No evident mass or inflammation Spleen: Eggshell calcification at the hilum measuring 2.3 cm, compatible with remote insult. Adrenals/Urinary Tract: Negative adrenals. No hydronephrosis or stone. Negative bladder. Stomach/Bowel: No evident obstruction, inflammation, or mass. Vascular/Lymphatic: Prominent but not strictly enlarged lymph nodes in the deep liver drainage. Reproductive: Negative Other: Small volume ascites. Nodular appearance  of the omentum including omentum that is herniated through a fatty umbilical hernia. Musculoskeletal: No acute or aggressive finding. Advanced lumbar facet degeneration.  IMPRESSION: 1. 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 Please correlate for cholangitis symptoms. 2. Indistinct right liver masses, favor metastatic disease over early biliary abscesses. 3. Nodular omentum most consistent with peritoneal carcinomatosis. 4. Cholelithiasis. 5. No acute or malignant finding in the chest. Electronically Signed   By: Monte Fantasia M.D.   On: 02/01/2019 06:34   Ct Angio Chest Pe W And/or Wo Contrast  Result Date: 02/20/2019 CLINICAL DATA:  68 y/o F; PE suspected, intermediate prob, positive D-dimer. EXAM: CT ANGIOGRAPHY CHEST WITH CONTRAST TECHNIQUE: Multidetector CT imaging of the chest was performed using the standard protocol during bolus administration of intravenous contrast. Multiplanar CT image reconstructions and MIPs were obtained to evaluate the vascular anatomy. CONTRAST:  One hundred thirty-five OMNIPAQUE IOHEXOL 350 MG/ML SOLN COMPARISON:  02/01/2019 CT chest, abdomen pelvis. FINDINGS: Cardiovascular: Multiple attempted acquisitions. Motion artifact. Satisfactory opacification of the pulmonary arteries to the segmental level. No pulmonary embolus identified. Satisfactory opacification of the thoracic aorta no aortic dissection or aneurysm. Mild cardiomegaly heart size. No pericardial effusion. Right port catheter tip projects over lower SVC. Coronary artery and aortic calcific atherosclerosis. Mediastinum/Nodes: No enlarged mediastinal, hilar, or axillary lymph nodes. Thyroid gland, trachea, and esophagus demonstrate no significant findings. Lungs/Pleura: Small right and trace left pleural effusions. Platelike atelectasis in the left lower lobe. Small area of consolidation the base of right lower lobe. No pneumothorax. Few clustered 2-3 mm nodules at the right lung apex are stable. Upper Abdomen: Increased ascites in the upper abdomen with loculations  along the liver surface as well as pneumobilia, incompletely visualized. Musculoskeletal: No chest wall abnormality. No acute or significant osseous findings. Review of the MIP images confirms the above findings. IMPRESSION: 1. Motion artifact. No pulmonary embolus identified. 2. Small right and trace left pleural effusions. 3. Small area of consolidation in the right lower lobe may represent pneumonia or atelectasis. 4. Coronary artery and aortic calcific atherosclerosis. 5. Increased ascites in the upper abdomen with loculations along the liver surface as well as pneumobilia, incompletely visualized. Electronically Signed   By: Kristine Garbe M.D.   On: 02/20/2019 01:14   Ct Abdomen Pelvis W Contrast  Result Date: 02/01/2019 CLINICAL DATA:  Cough with persistent shortness of breath. EXAM: CT CHEST, ABDOMEN, AND PELVIS WITH CONTRAST TECHNIQUE: Multidetector CT imaging of the chest, abdomen and pelvis was performed following the standard protocol during bolus administration of intravenous contrast. CONTRAST:  160mL ISOVUE-300 IOPAMIDOL (ISOVUE-300) INJECTION 61% COMPARISON:  Right upper quadrant ultrasound earlier today FINDINGS: CT CHEST FINDINGS Cardiovascular: Normal heart size. No pericardial effusion. Coronary atherosclerosis. Mild aortic atherosclerosis. Mediastinum/Nodes: Negative for adenopathy. Lungs/Pleura: Mild dependent atelectasis. No consolidation or nodularity Musculoskeletal: No acute or aggressive finding. Generalized spondylosis. CT ABDOMEN PELVIS FINDINGS Hepatobiliary: 4 or 5 indistinct liver masses centered on the right, measuring up to 2.3 cm. There is cholelithiasis and gallbladder wall thickening with mild fat indistinctness. Murphy sign was negative on prior ultrasound. There is intrahepatic biliary duct dilatation above a circumferentially thickened common bile duct. Small subcapsular fluid collection along the right liver without serosal enhancement, favor loculated peritoneal  fluid. Pancreas: No evident mass or inflammation Spleen: Eggshell calcification at the hilum measuring 2.3 cm, compatible with remote insult. Adrenals/Urinary Tract: Negative adrenals. No hydronephrosis or stone. Negative bladder. Stomach/Bowel: No evident obstruction,  inflammation, or mass. Vascular/Lymphatic: Prominent but not strictly enlarged lymph nodes in the deep liver drainage. Reproductive: Negative Other: Small volume ascites. Nodular appearance of the omentum including omentum that is herniated through a fatty umbilical hernia. Musculoskeletal: No acute or aggressive finding. Advanced lumbar facet degeneration. IMPRESSION: 1. 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 Please correlate for cholangitis symptoms. 2. Indistinct right liver masses, favor metastatic disease over early biliary abscesses. 3. Nodular omentum most consistent with peritoneal carcinomatosis. 4. Cholelithiasis. 5. No acute or malignant finding in the chest. Electronically Signed   By: Monte Fantasia M.D.   On: 02/01/2019 06:34   Mr Liver W Wo Contrast  Result Date: 02/06/2019 CLINICAL DATA:  68 year old female with history of abnormal CT scan concerning for potential cholangiocarcinoma. Follow-up study. EXAM: MRI ABDOMEN WITHOUT AND WITH CONTRAST TECHNIQUE: Multiplanar multisequence MR imaging of the abdomen was performed both before and after the administration of intravenous contrast. CONTRAST:  8 mL of Gadavist. COMPARISON:  No prior abdominal MRI. CT the chest, abdomen and pelvis 02/01/2019. FINDINGS: Comment: Today's study is limited by considerable patient respiratory motion. Lower chest: T2 signal intensity dependently in the lower right hemithorax, compatible with a small to moderate right pleural effusion, likely with some associated passive atelectasis in the right lower lobe. Hepatobiliary: There are  multiple hepatic lesions predominantly throughout the right lobe of the liver. The largest of these is in segment 6 (axial image 26 of series 5) measuring 2.4 x 3.7 cm. These lesions are all T1 hypointense, heterogeneously T2 hyperintense, demonstrate diffusion restriction, and are hypovascular with peripheral enhancement on post gadolinium imaging, highly concerning for metastatic lesions. MRCP imaging was not performed. However, the previously noted intrahepatic biliary ductal dilatation evident on CT examination 02/01/2019 is no longer noted on today's study. Mild T2 hyperintensity in the periportal aspects of the hepatic parenchyma, suggesting periportal edema. There is a subtle signal void in the common bile duct, likely reflective of an indwelling common bile duct stent. Some dependent T2 hypointensity and multiple well-defined rounded T2 hypo intensities are noted within the gallbladder, compatible with a combination of biliary sludge and gallstones. Gallbladder does not appear distended. Gallbladder wall does not appear thickened. Pancreas: No pancreatic mass. No pancreatic ductal dilatation. No pancreatic or peripancreatic fluid or inflammatory changes. Spleen: Well-defined T1 hypointense, T2 hyperintense, nonenhancing lesion in the anterior aspect of the spleen, corresponding to rim calcified structure on recent CT examination, likely sequela of remote splenic trauma. Spleen is otherwise unremarkable in appearance. Adrenals/Urinary Tract: Bilateral kidneys and adrenal glands are normal in appearance. No hydroureteronephrosis in the visualized portions of the abdomen. Stomach/Bowel: Visualized portions are unremarkable. Vascular/Lymphatic: No aneurysm identified in the visualized abdominal vasculature. Mildly enlarged portacaval lymph node measuring up to 1.6 cm in short axis (axial image forty-nine of series 13). Other:  Small volume of ascites most evident inferior to the liver. Musculoskeletal: No  aggressive appearing osseous lesions are noted in the visualized portions of the skeleton. IMPRESSION: 1. Multiple malignant appearing lesions throughout the right lobe of the liver, most likely to reflect metastatic lesions. This is associated with some portacaval lymphadenopathy. 2. Previously noted intrahepatic biliary ductal dilatation has resolved following placement of common bile duct stent and sphincterotomy. 3. Cholelithiasis and biliary sludge in the gallbladder. No findings to suggest an acute cholecystitis at this time. 4. Trace volume of ascites. Electronically Signed   By: Mauri Brooklyn.D.  On: 02/06/2019 06:42   US Paracentesis  Result Date: 02/20/2019 INDICATION: Cholangiocarcinoma. Abdominal distention and ascites. Request for diagnostic and therapeutic paracentesis. EXAM: ULTRASOUND GUIDED LEFT LOWER QUADRANT PARACENTESIS MEDICATIONS: None. COMPLICATIONS: None immediate. PROCEDURE: Informed written consent was obtained from the patient after a discussion of the risks, benefits and alternatives to treatment. A timeout was performed prior to the initiation of the procedure. Initial ultrasound scanning demonstrates a moderate amount of ascites within the left lower abdominal quadrant. The left lower abdomen was prepped and draped in the usual sterile fashion. 1% lidocaine with epinephrine was used for local anesthesia. Following this, a 6 Fr Safe-T-Centesis catheter was introduced. An ultrasound image was saved for documentation purposes. The paracentesis was performed. The catheter was removed and a dressing was applied. The patient tolerated the procedure well without immediate post procedural complication. FINDINGS: A total of approximately 2.8 L of clear yellow fluid was removed. Samples were sent to the laboratory as requested by the clinical team. IMPRESSION: Successful ultrasound-guided paracentesis yielding 2.8 liters of peritoneal fluid. Read by: Ascencion Dike PA-C Electronically  Signed   By: Sandi Mariscal M.D.   On: 02/20/2019 11:17   Dg Chest Port 1 View  Result Date: 02/19/2019 CLINICAL DATA:  Sepsis, vomiting EXAM: PORTABLE CHEST 1 VIEW COMPARISON:  Chest CT 02/01/2019 FINDINGS: Right Port-A-Cath in place with the tip in the SVC. Low lung volumes with bibasilar atelectasis and small effusions, right greater than left. Heart is normal size. No acute bony abnormality. IMPRESSION: Low lung volumes with bibasilar atelectasis and small effusions, right greater than left. Electronically Signed   By: Rolm Baptise M.D.   On: 02/19/2019 23:35   Dg C-arm 1-60 Min-no Report  Result Date: 02/01/2019 Fluoroscopy was utilized by the requesting physician.  No radiographic interpretation.   US Abdomen Limited Ruq  Result Date: 02/20/2019 CLINICAL DATA:  68 y/o F; right upper quadrant abdominal pain with pneumobilia. History of metastatic liver cancer and cholangiocarcinoma. EXAM: ULTRASOUND ABDOMEN LIMITED RIGHT UPPER QUADRANT COMPARISON:  02/06/2019 MRI of the abdomen FINDINGS: Gallbladder: Gallbladder sludge and stones measuring up to 1.9 cm. No gallbladder wall thickening. Negative sonographic Murphy's sign. Common bile duct: Diameter: 5.6 mm.  Common bile duct pneumobilia. Liver: Pneumobilia predominantly in the left lobe. Numerous ill-defined liver masses seen within the right lobe of liver better characterized on the prior MRI of the abdomen. Loculated fluid collections along the surface of the right lobe of liver. Portal vein is patent on color Doppler imaging with normal direction of blood flow towards the liver. IMPRESSION: 1. Intrahepatic and common bile duct pneumobilia. 2. Cholelithiasis and gallbladder sludge. No findings of acute cholecystitis. 3. Ill-defined masses in right lobe of liver better characterized on prior MRI. 4. Loculated fluid collections along the surface of right lobe of liver. Electronically Signed   By: Kristine Garbe M.D.   On: 02/20/2019 04:49   US  Abdomen Limited Ruq  Result Date: 02/01/2019 CLINICAL DATA:  Episodic right upper quadrant pain EXAM: ULTRASOUND ABDOMEN LIMITED RIGHT UPPER QUADRANT COMPARISON:  None. FINDINGS: Gallbladder: Gallbladder sludge and multiple shadowing stones, including a stone at the neck. Gallbladder wall is mildly thickened to 5 mm. No pericholecystic edema. Negative sonographic Murphy sign. Common bile duct: Diameter: 8 mm. Where visualized, no filling defect. There is intrahepatic bile duct dilatation Liver: There are 3 solid appearing liver masses seen in the right liver measuring up to 5.8 cm. Portal vein is patent on color Doppler imaging with normal direction of blood  flow towards the liver. IMPRESSION: 1. At least 3 liver masses primarily concerning for metastatic disease. Recommend enhanced abdomen and pelvis CT. 2. Cholelithiasis and sludge including stone at the gallbladder neck or cystic duct. The incompletely visualized common bile duct is also dilated and there is intrahepatic biliary dilatation. There could be obstructing stone or mass, attention on follow-up CT. 3. No suspected cholecystitis. Electronically Signed   By: Monte Fantasia M.D.   On: 02/01/2019 05:48    PERFORMANCE STATUS (ECOG) : 3 - Symptomatic, >50% confined to bed  Review of Systems As noted above. Otherwise, a complete review of systems is negative.  Physical Exam General: Frail-appearing Cardiovascular: regular rate and rhythm Pulmonary: clear ant fields Abdomen: Distended, positive fluid wave GU: no suprapubic tenderness Extremities: no edema Skin: no rashes Neurological: Alert, oriented to person and place  IMPRESSION: Patient's mental status appears to be improved this morning.  She is alert and oriented to person place.  She knows it is February but cannot state the year.  She does not know why she is in the hospital.  She denies any concerns.  She is comfortable appearing and says she has no pain or other distressing  symptoms.  No family currently present. I spoke with daughter by phone. She says that she will be at the hospital later this morning and agreed to meet with me to discuss goals. I have asked RN to notify me when daughter arrives.  PLAN: Will meet with daughter when she arrives    Time Total: 15 minutes  Visit consisted of counseling and education dealing with the complex and emotionally intense issues of symptom management and palliative care in the setting of serious and potentially life-threatening illness.Greater than 50%  of this time was spent counseling and coordinating care related to the above assessment and plan.  Signed by: Altha Harm, PhD, NP-C [phone] (Work Cell)

## 2019-02-23 NOTE — Progress Notes (Signed)
I met with patient's daughter and son-in-law.  Daughter says that she is aware the patient has stage IV cancer.  Daughter says that she lost her father to a similar cancer and feels like she similarly expects patient to rapidly decline.  Daughter says that her primary focus is on maximizing patient's quality of life for whatever time she has left.  She is undecided about the role of treatment but feels she would be unlikely to pursue it if the symptom burden was too high.  I encouraged her to follow-up with Dr. Janese Banks in the clinic, where options could be more thoroughly explored.  We did discuss the role of hospice in the event that patient and family were to decide against treatment.  For now, daughter would like patient to discharge to rehab and try to improve her performance status.  She is interested in patient being followed by palliative care at that setting.  I will also plan to follow her in the clinic.  We discussed CODE STATUS.  Daughter says that she would not want patient to be resuscitated or have her life prolonged artificially on machines.  She requested that patient be a "DNR".  Daughter says that she has 7-year-old in the home who is seen patient decline.  She says she is struggling with how to tell her her support her with the future loss of her grandmother.  We discussed the [facility] program and I gave her contact information.  Case discussed with Dr. Janese Banks.   Plan: -Rehab with palliative care following -Patient will f/u with Dr. Janese Banks and me in the clinic -[facility] referral -DNR  Time Total: 30 minutes  Visit consisted of counseling and education dealing with the complex and emotionally intense issues of symptom management and palliative care in the setting of serious and potentially life-threatening illness.Greater than 50%  of this time was spent counseling and coordinating care related to the above assessment and plan.  Signed by: Altha Harm, PhD, NP-C [phone]  (Work Cell)

## 2019-02-23 NOTE — Discharge Summary (Signed)
[facility] [place] NAME: Kaylee Wolf    MR#:  [id number]  DATE OF BIRTH:  April 02, 1951  DATE OF ADMISSION:  02/19/2019 ADMITTING PHYSICIAN: Dustin Flock, MD  DATE OF DISCHARGE: 02/23/2019  PRIMARY CARE PHYSICIAN: Remi Haggard, FNP    ADMISSION DIAGNOSIS:  Abdominal distension [R14.0] RUQ pain [R10.11] Hypoxia [R09.02] Sepsis ([place]) [A41.9] Community acquired pneumonia, unspecified laterality [J18.9]  DISCHARGE DIAGNOSIS:  Active Problems:   SOB (shortness of breath)   Community acquired pneumonia   Palliative care encounter   Ascites   SECONDARY DIAGNOSIS:   Past Medical History:  Diagnosis Date  . Anxiety   . Colon polyp 11/29/2012   at [facility].  . Insomnia   . Mixed hyperlipidemia   . Recurrent major depressive disorder ([place])   . RLS (restless legs syndrome)   . Seizures ([place])   . Tinea pedis of both feet   . Type 2 diabetes mellitus ([place]) 2014?  [name] Varicose veins     HOSPITAL COURSE:   68 year old female with past medical history of diabetes, anxiety, hypertension, hyperlipidemia, restless leg syndrome, recently diagnosed cholangiocarcinoma who was presented to the hospital due to generalized weakness.  1.  Pneumonia-suspected to be community-acquired pneumonia. -Patient was treated with IV ceftriaxone, Zithromax in the hospital and has clinically improved.  She denies any worsening shortness of breath or congestion presently.  This pneumonia was incidentally noted on CT chest on admission.  Patient is now being discharged on 5 more days of oral Levaquin.  Her cultures remain negative.  2.  Generalized weakness-multifactorial in nature related to deconditioning with underlying malignancy and also suspected pneumonia. -Patient had a ultrasound-guided paracentesis with 2.5 L of fluid removed 2 days ago and also seen by physical therapy who recommended short-term rehab.   Patient's family is in agreement with this plan.  She is being discharged to short-term rehab today.  3.  Cholangiocarcinoma-recently diagnosed.  Patient has not initiated treatment yet. -Discussed with oncology and also palliative care consult obtained.  Patient has not initiated treatment as mentioned patient is a DNR now and if her overall weakness and condition improves the plan on initiating treatment as an outpatient. -Palliative care to follow the patient at the skilled nursing facility.  4. DM type II without complication- pt. Will continue metformin, while in the hospital pt. Was on sliding scale insulin.  5.  History of seizures-no acute seizure activity.  pt. Will Continue Dilantin/Depakote.  6.  Hyperlipidemia-  pt. Will Cont. atorvastatin.  7.  Hypothyroidism- pt. Will continue Synthroid.  8.  History of anxiety/depression- pt. Will continue Klonopin, Lexapro.  DISCHARGE CONDITIONS:   Stable.   CONSULTS OBTAINED:  Treatment Team:  Sindy Guadeloupe, MD  DRUG ALLERGIES:  No Known Allergies  DISCHARGE MEDICATIONS:   Allergies as of 02/23/2019   No Known Allergies     Medication List    TAKE these medications   acetaminophen 325 MG tablet Commonly known as:  TYLENOL Take 2 tablets (650 mg total) by mouth every 8 (eight) hours as needed for mild pain (or Fever >/= 101).   aspirin 81 MG EC tablet Take 1 tablet (81 mg total) by mouth daily.   atorvastatin 20 MG tablet Commonly known as:  LIPITOR Take 1 tablet (20 mg total) by mouth daily.   blood glucose meter kit and supplies Kit Dispense based on patient and insurance preference. Check blood glucose once daily.  clonazePAM 0.5 MG tablet Commonly known as:  KLONOPIN Take 0.5 mg by mouth at bedtime.   dexamethasone 4 MG tablet Commonly known as:  DECADRON Take 2 tablets by mouth once a day on the day after chemotherapy and then take 2 tablets two times a day for 2 days. Take with food.   divalproex  125 MG DR tablet Commonly known as:  DEPAKOTE Take 1 pill in am and 2 pill at night   escitalopram 20 MG tablet Commonly known as:  LEXAPRO Take 1 tablet (20 mg total) by mouth daily.   furosemide 40 MG tablet Commonly known as:  LASIX Take 40 mg by mouth daily.   gabapentin 400 MG capsule Commonly known as:  NEURONTIN Take 1 capsule (400 mg total) by mouth 2 (two) times daily.   lamoTRIgine 150 MG tablet Commonly known as:  LAMICTAL Take 150 mg by mouth 2 (two) times daily.   levofloxacin 500 MG tablet Commonly known as:  LEVAQUIN Take 1 tablet (500 mg total) by mouth daily for 7 days.   levothyroxine 50 MCG tablet Commonly known as:  SYNTHROID, LEVOTHROID Take 50 mcg by mouth daily before breakfast.   lidocaine-prilocaine cream Commonly known as:  EMLA Apply to affected area once   LINZESS 72 MCG capsule Generic drug:  linaclotide Take 72 mcg by mouth as needed.   metFORMIN 500 MG tablet Commonly known as:  GLUCOPHAGE Take 1 tablet (500 mg total) by mouth 2 (two) times daily.   multivitamin tablet Take 1 tablet by mouth daily.   omeprazole 20 MG capsule Commonly known as:  PRILOSEC Take 1 capsule (20 mg total) by mouth daily. Before 30 min before first meal of day   ondansetron 4 MG disintegrating tablet Commonly known as:  ZOFRAN-ODT Take 1 tablet (4 mg total) by mouth 2 (two) times daily.   ondansetron 8 MG tablet Commonly known as:  ZOFRAN Take 1 tablet (8 mg total) by mouth 2 (two) times daily as needed. Start on the third day after chemotherapy.   oxyCODONE 5 MG immediate release tablet Commonly known as:  Oxy IR/ROXICODONE Take 1 tablet (5 mg total) by mouth every 6 (six) hours as needed for moderate pain.   phenytoin 200 MG ER capsule Commonly known as:  DILANTIN Take 100 mg by mouth 2 (two) times daily.   polyethylene glycol packet Commonly known as:  MIRALAX / GLYCOLAX Take 17 g by mouth daily as needed for mild constipation.   potassium  chloride 10 MEQ tablet Commonly known as:  K-DUR,KLOR-CON Take 10 mEq by mouth once.   prochlorperazine 10 MG tablet Commonly known as:  COMPAZINE Take 1 tablet (10 mg total) by mouth every 6 (six) hours as needed (Nausea or vomiting).   REXULTI 3 MG Tabs Generic drug:  Brexpiprazole 3 mg every morning.   Vitamin D-3 25 MCG (1000 UT) Caps Take 1 capsule by mouth daily.         DISCHARGE INSTRUCTIONS:   DIET:  Cardiac diet and Diabetic diet  DISCHARGE CONDITION:  Stable  ACTIVITY:  Activity as tolerated  OXYGEN:  Home Oxygen: Yes.     Oxygen Delivery: 2 liters/min via Patient connected to nasal cannula oxygen  DISCHARGE LOCATION:  nursing home   If you experience worsening of your admission symptoms, develop shortness of breath, life threatening emergency, suicidal or homicidal thoughts you must seek medical attention immediately by calling 911 or calling your MD immediately  if symptoms less severe.  You Must  read complete instructions/literature along with all the possible adverse reactions/side effects for all the Medicines you take and that have been prescribed to you. Take any new Medicines after you have completely understood and accpet all the possible adverse reactions/side effects.   Please note  You were cared for by a hospitalist during your hospital stay. If you have any questions about your discharge medications or the care you received while you were in the hospital after you are discharged, you can call the unit and asked to speak with the hospitalist on call if the hospitalist that took care of you is not available. Once you are discharged, your primary care physician will handle any further medical issues. Please note that NO REFILLS for any discharge medications will be authorized once you are discharged, as it is imperative that you return to your primary care physician (or establish a relationship with a primary care physician if you do not have one)  for your aftercare needs so that they can reassess your need for medications and monitor your lab values.     Today   No acute events overnight, attempted another ultrasound-guided paracentesis but no ascitic fluid left to removed.  Clinically patient denies any shortness of breath cough or congestion.  Will discharge to skilled nursing facility with palliative care to follow.  VITAL SIGNS:  Blood pressure 114/61, pulse (!) 105, temperature 98.9 F (37.2 C), resp. rate 19, height _0  (1.651 m), weight 89.7 kg, last menstrual period 05/10/2012, SpO2 97 %.  I/O:    Intake/Output Summary (Last 24 hours) at 02/23/2019 1243 Last data filed at 02/22/2019 2300 Gross per 24 hour  Intake -  Output 800 ml  Net -800 ml    PHYSICAL EXAMINATION:   GENERAL:  68 y.o.-year-old patient lying in bed in no acute distress.  EYES: Pupils equal, round, reactive to light and accommodation. No scleral icterus. Extraocular muscles intact.  HEENT: Head atraumatic, normocephalic. Oropharynx and nasopharynx clear.  NECK:  Supple, no jugular venous distention. No thyroid enlargement, no tenderness.  LUNGS: Poor Resp. Effort , no wheezing, rales, rhonchi. No use of accessory muscles of respiration.  CARDIOVASCULAR: S1, S2 normal. No murmurs, rubs, or gallops.  Right Chest wall port in place ABDOMEN: Soft, nontender, distended. Bowel sounds present. No organomegaly or mass.  EXTREMITIES: No cyanosis, clubbing, +1-2 dependent edema b/l.     NEUROLOGIC: Cranial nerves II through XII are intact. No focal Motor or sensory deficits b/l. Globally weak.   PSYCHIATRIC: The patient is alert and oriented x 3.  SKIN: No obvious rash, lesion, or ulcer.   DATA REVIEW:   CBC Recent Labs  Lab 02/23/19 0353  WBC 6.7  HGB 9.7*  HCT 32.6*  PLT 520*    Chemistries  Recent Labs  Lab 02/21/19 0323 02/23/19 0353  NA 139 139  K 3.9 3.6  CL 99 100  CO2 32 33*  GLUCOSE 143* 144*  BUN 10 11  CREATININE 0.53 0.67   CALCIUM 8.0* 7.7*  AST 28  --   ALT 12  --   ALKPHOS 192*  --   BILITOT 0.8  --     Cardiac Enzymes Recent Labs  Lab 02/19/19 2256  TROPONINI <0.03    Microbiology Results  Results for orders placed or performed during the hospital encounter of 02/19/19  Culture, blood (Routine x 2)     Status: None (Preliminary result)   Collection Time: 02/19/19 10:56 PM  Result Value Ref Range Status  Specimen Description BLOOD LEFT WRIST  Final   Special Requests   Final    BOTTLES DRAWN AEROBIC AND ANAEROBIC Blood Culture results may not be optimal due to an excessive volume of blood received in culture bottles   Culture   Final    NO GROWTH 4 DAYS Performed at [facility], [address], [place], [place] [address]    Report Status PENDING  Incomplete  Culture, blood (Routine x 2)     Status: None (Preliminary result)   Collection Time: 02/19/19 10:56 PM  Result Value Ref Range Status   Specimen Description BLOOD RIGHT WRIST  Final   Special Requests   Final    BOTTLES DRAWN AEROBIC AND ANAEROBIC Blood Culture results may not be optimal due to an excessive volume of blood received in culture bottles   Culture   Final    NO GROWTH 4 DAYS Performed at [facility], [address], [place], [place] [address]    Report Status PENDING  Incomplete  Body fluid culture     Status: None (Preliminary result)   Collection Time: 02/20/19 10:25 AM  Result Value Ref Range Status   Specimen Description   Final    PERITONEAL Performed at [facility], [address] [place], [place] [address]    Special Requests   Final    PERITONEAL Performed at [facility], [place] [place], [place] [address]    Gram Stain   Final    RARE WBC PRESENT, PREDOMINANTLY PMN NO ORGANISMS SEEN Performed at [facility], [place] [address], [place], [place] [address]    Culture NO GROWTH  Final   Report Status PENDING  Incomplete    RADIOLOGY:  US  Abdomen Limited  Result Date: 02/23/2019 CLINICAL DATA:  Ascites, abdominal distension, assess for paracentesis EXAM: LIMITED ABDOMEN ULTRASOUND FOR ASCITES TECHNIQUE: Limited ultrasound survey for ascites was performed in all four abdominal quadrants. COMPARISON:  02/20/2019 FINDINGS: Very small amount of left lower quadrant ascites, not enough to warrant therapeutic paracentesis. Remainder of the abdomen demonstrates no significant ascites. Procedure not performed. IMPRESSION: Small amount of left lower quadrant ascites by ultrasound. Electronically Signed   By: Jerilynn Mages.  Shick M.D.   On: 02/23/2019 09:50      Management plans discussed with the patient, family and they are in agreement.  CODE STATUS:     Code Status Orders  (From admission, onward)         Start     Ordered   02/23/19 1051  Do not attempt resuscitation (DNR)  Continuous    Question Answer Comment  In the event of cardiac or respiratory ARREST Do not call a "code blue"   In the event of cardiac or respiratory ARREST Do not perform Intubation, CPR, defibrillation or ACLS   In the event of cardiac or respiratory ARREST Use medication by any route, position, wound care, and other measures to relive pain and suffering. May use oxygen, suction and manual treatment of airway obstruction as needed for comfort.      02/23/19 1050         TOTAL TIME TAKING CARE OF THIS PATIENT: 45 minutes.    Henreitta Leber M.D on 02/23/2019 at 12:43 PM  Between 7am to 6pm - Pager - [phone]  After 6pm go to [email or web address] - Proofreader  [facility]  Office  [phone]  CC: Primary care physician; Remi Haggard, FNP

## 2019-02-23 NOTE — Care Management Important Message (Signed)
Important Message  Patient Details  Name: Kaylee Wolf MRN: [id number] Date of Birth: [birth year]   Medicare Important Message Given:  Yes    Juliann Pulse A Rogan Ecklund 02/23/2019, 11:33 AM

## 2019-02-23 NOTE — Progress Notes (Signed)
Patient brought to radiology department for possible paracentesis.  Patient underwent paracentesis 2/25 with 2.8 liters removed.  Limited US Abdomen shows a small amount of fluid re-accumulation in the LUQ only.  Removal of fluid not likely to provide therapeutic benefit at this time.  Discussed with patient who agrees.  Returned to room, no procedure performed.   Brynda Greathouse, MS RD PA-C 9:50 AM

## 2019-02-23 NOTE — Clinical Social Work Note (Signed)
CSW received call from [facility] regarding patient authorization. Patient has been approved for 3 days starting 2/28 and ending 3/1 to go to [facility] under reference number [id number].   [place], [place]

## 2019-02-23 NOTE — Clinical Social Work Note (Signed)
Patient is medically ready for discharge today. CSW notified patient's daughter at bedside of discharge today to [facility]. Daughter is in agreement. CSW also notified Claiborne Billings at [facility] of discharge today. Patient will be transported by EMS. RN to call report and call for transport.   [place], [place]

## 2019-02-23 NOTE — Discharge Planning (Signed)
Patient IV x2 removed.  RN assessment and VS revealed stability for DC to [facility].  Discharge papers printed and placed in Facility packet with Signed DNR and scripts. Report called and s/w Lavina Hamman, LPN. EMS contacted to transport to [facility]. Waiting on arrival.

## 2019-02-24 LAB — CULTURE, BLOOD (ROUTINE X 2)
Culture: NO GROWTH
Culture: NO GROWTH

## 2019-02-27 ENCOUNTER — Telehealth: Payer: Self-pay | Admitting: *Deleted

## 2019-02-27 DIAGNOSIS — C221 Intrahepatic bile duct carcinoma: Secondary | ICD-10-CM

## 2019-02-27 DIAGNOSIS — C787 Secondary malignant neoplasm of liver and intrahepatic bile duct: Principal | ICD-10-CM

## 2019-02-27 NOTE — Telephone Encounter (Signed)
Called [facility] and spoke to [facility] the nurse for the patient.  Asked if they would be able to transport patient on March 9 at 115 for lab 130 to see the doctor in 2:00 to see Merrily Pew the palliative care doctor.  Asked if she could come 15 minutes early to register so she will make the appointment to arrive at 1 PM.  Pamala Hurry took down my name and number just in case they can work out the transportation she would call me back.

## 2019-02-28 ENCOUNTER — Non-Acute Institutional Stay: Payer: Medicare HMO | Admitting: Nurse Practitioner

## 2019-02-28 ENCOUNTER — Encounter: Payer: Self-pay | Admitting: Nurse Practitioner

## 2019-02-28 VITALS — BP 124/80 | HR 78 | Resp 22

## 2019-02-28 DIAGNOSIS — Z515 Encounter for palliative care: Secondary | ICD-10-CM

## 2019-02-28 DIAGNOSIS — R609 Edema, unspecified: Secondary | ICD-10-CM

## 2019-02-28 DIAGNOSIS — R0602 Shortness of breath: Secondary | ICD-10-CM

## 2019-02-28 NOTE — Progress Notes (Signed)
[employer] Palliative Care Consult Note Telephone: [phone]  Fax: [phone]  PATIENT NAME: Kaylee Wolf DOB: 1951/01/22 MRN: [id number]  PRIMARY CARE PROVIDER:   Remi Haggard, FNP  REFERRING PROVIDER:  Dr Hodges/[facility] RESPONSIBLE PARTY:   daughter Lucyle Alumbaugh at [phone] or sister Eulah Citizen at [phone] or [phone]   RECOMMENDATIONS and PLAN:   1. Palliative care encounter Z51.5; Palliative medicine team will continue to support patient, patient's family, and medical team. Visit consisted of counseling and education dealing with the complex and emotionally intense issues of symptom management and palliative care in the setting of serious and potentially life-threatening illness  2. 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 She had her eyes closed but open to verbal cues. She was able to answer questions appropriately. Kaylee Wolf endorses that she's very tired and weak. We talked about purpose for palliative care visit come like consent given. We talked about past medical history, last time she was independent at home which was beginning of February prior to initial hospitalization with cancer diagnosis. We talked about past medical history. We talked to better functional level at that time. We talked about recent hospitalizations. We talked about symptoms of pain and shortness of breath. We talked about her abdomen being more obese, tight and round. We talked about her appetite. We talked about her functional ability with therapy. Limited verbal discussion as she became very tired during  palliative care visit. Assessment completed. Emotional support provided.  I called Kaylee Wolf, Kaylee Wolf's daughter. Talked about purpose or palliative care visit. We talked about visit with Kaylee Braniff. We talked about the last time she was independent at home, past medical history, chronic disease progression, recent hospitalizations, recent diagnosis of cancer. Kaylee Wolf endorses that her dad passed at home with hospice with the exact same diagnosis. We talked about discharged short-term rehab Kaylee Diss's ability to function. We talked about realistic expectations as she does continue to be very weak and appears chronically ill. We talked about medical goals of care including aggressive versus conservative versus comfort care. We talked about symptoms of pain and shortness of breath. We talked about paracentesis and that clinically at present time she appears that she is slowly worsening. We talked about possiblely scheduling weekly paracentesis. We talked about risks vs benefits. We talked about high risk of rehospitalization. She is a DNR. Wishes are to treat what is treatable at present time. Therapeutic listening and emotional support provided. Following up with Billey Chang nurse practitioner for palliative care [place] and oncology for possible scheduling of paracentesis. I updated nursing staff and contact primary for possibly scheduling outpatient paracentesis.  I spent 105 minutes providing this consultation,  from 1:00pm to 3:45pm. More than 50% of the time in this consultation was spent coordinating communication.   HISTORY OF PRESENT ILLNESS:  Kaylee Wolf is a 68 y.o. 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 Hospitalize 2 /6/ 2020 to 02/06/2019 with generalized  abdominal pain, cough, increase seizure-like activity with rhinorrhea. She was taking Mucinex but  was not helping. After seizure activity she became confused. Workup significant for new cholangiocarcinoma with metastasis to liver and peritoneal carcinomatosis, liver transaminases trending down s/p ERCP and biliary stenting 2/7 / 2020. A biliary sphincterotomy was performed. Cells for cytology obtained upper third of main bile duct during ercp with recommendation for outpatient liver biopsy. MRI of liver reveals multiple malignant appearing lesions in the liver. Dr. Janese Banks is oncologist who consulted. Epilepsy is not been taking medication for three weeks because she was not feeling well and continued on home Depakote, Gabapentin, Dilantin, Lamictal. Initial outpatient oncology visit 2 / 13 / 2020 and plan was to start first dose of palliative gemcitabine and cisplatin on 2/27 / 2020. Ultrasound of right upper quadrant reveals cholelithiasis and gallbladder sludge though no acute cholecystitis. Ill-defined masses in the right upper lobe of liver. Hepatic and common bile duct pnemobilia. Plan is to start that chemotherapy after discharge from hospital. Normocytic anemia with thrombocytosis likely secondary to malignancy. Seizure disorder Depakote with subject therapeutic levels. Blood cultures negative though possible. She was re hospitalized 2 / 24 / 2020 to 2 / 28 / 2020 for weakness secondary to pneumonia suspected community-acquired treated with antibiotic therapy and improved during hospitalization. General weakness multifactorial secondary to deconditioning with underlying malignancy and pneumonia. She did have a ultrasound-guided paracentesis with 2.5 L of fluid removed. Cholangiocarcinoma recently diagnosed but not initial treatment yet discussion with oncologist obtained. She was seen by palliative care during hospitalization and made a do not resuscitate. She was made a DNR. She was discharged to short-term  rehab at [facility] where she remains. Since Kaylee Sisler was discharged to short-term rehab at [facility] she has been slow to progress. She has been weak and intermittently short of breath per staff. She does require assistance for transferring, adl's. She is able to feed herself but continues with a poor appetite. She does complain of intermittent pain. She is able to verbalize her needs. At present she is lying in bed. She appears chronically ill O2 dependent. No visitors present. Palliative Care was asked to help address goals of care.   CODE STATUS: DNR  PPS: 40% HOSPICE ELIGIBILITY/DIAGNOSIS: TBD  PAST MEDICAL HISTORY:  Past Medical History:  Diagnosis Date  . Anxiety   . Colon polyp 11/29/2012   at [place].  . Insomnia   . Mixed hyperlipidemia   . Recurrent major depressive disorder ([place])   . RLS (restless legs syndrome)   . Seizures ([place])   . Tinea pedis of both feet   . Type 2 diabetes mellitus ([place]) 2014?  [name] Varicose veins     SOCIAL HX:  Social History   Tobacco Use  . Smoking status: Never Smoker  . Smokeless tobacco: Never Used  Substance Use Topics  . Alcohol use: No    Alcohol/week: 0.0 standard drinks    ALLERGIES: No Known Allergies   PERTINENT MEDICATIONS:  Outpatient Encounter Medications as of 02/28/2019  Medication Sig  . acetaminophen (TYLENOL) 325 MG tablet Take 2 tablets (650 mg total) by mouth every 8 (eight) hours as needed for mild pain (or Fever >/= 101).  [name] aspirin EC 81 MG EC tablet Take 1 tablet (81 mg total) by mouth daily.  [name] atorvastatin (LIPITOR) 20 MG tablet Take 1 tablet (20 mg total) by mouth daily.  . blood glucose meter kit and supplies KIT Dispense based on patient and insurance preference. Check blood glucose once daily.  [name]  Cholecalciferol (VITAMIN D-3) 1000 UNITS CAPS Take 1 capsule by mouth daily.   . clonazePAM (KLONOPIN) 0.5 MG tablet Take 1 tablet (0.5 mg total) by  mouth at bedtime.  [name] dexamethasone (DECADRON) 4 MG tablet Take 2 tablets by mouth once a day on the day after chemotherapy and then take 2 tablets two times a day for 2 days. Take with food.  . divalproex (DEPAKOTE) 125 MG DR tablet Take 1 pill in am and 2 pill at night  . escitalopram (LEXAPRO) 20 MG tablet Take 1 tablet (20 mg total) by mouth daily.  . furosemide (LASIX) 40 MG tablet Take 40 mg by mouth daily.   [name] gabapentin (NEURONTIN) 400 MG capsule Take 1 capsule (400 mg total) by mouth 2 (two) times daily.  [name] lamoTRIgine (LAMICTAL) 150 MG tablet Take 150 mg by mouth 2 (two) times daily.   [name] levofloxacin (LEVAQUIN) 500 MG tablet Take 1 tablet (500 mg total) by mouth daily for 7 days.  [name] levothyroxine (SYNTHROID, LEVOTHROID) 50 MCG tablet Take 50 mcg by mouth daily before breakfast.   . lidocaine-prilocaine (EMLA) cream Apply to affected area once  . linaclotide (LINZESS) 72 MCG capsule Take 72 mcg by mouth as needed.  . metFORMIN (GLUCOPHAGE) 500 MG tablet Take 1 tablet (500 mg total) by mouth 2 (two) times daily.  . Multiple Vitamin (MULTIVITAMIN) tablet Take 1 tablet by mouth daily.  [name] omeprazole (PRILOSEC) 20 MG capsule Take 1 capsule (20 mg total) by mouth daily. Before 30 min before first meal of day  . ondansetron (ZOFRAN) 8 MG tablet Take 1 tablet (8 mg total) by mouth 2 (two) times daily as needed. Start on the third day after chemotherapy.  . ondansetron (ZOFRAN-ODT) 4 MG disintegrating tablet Take 1 tablet (4 mg total) by mouth 2 (two) times daily.  [name] oxyCODONE (OXY IR/ROXICODONE) 5 MG immediate release tablet Take 1 tablet (5 mg total) by mouth every 6 (six) hours as needed for moderate pain.  . phenytoin (DILANTIN) 200 MG ER capsule Take 100 mg by mouth 2 (two) times daily.   . polyethylene glycol (MIRALAX / GLYCOLAX) packet Take 17 g by mouth daily as needed for mild constipation.  . potassium chloride (K-DUR,KLOR-CON) 10 MEQ tablet Take 10 mEq by mouth once.   .  prochlorperazine (COMPAZINE) 10 MG tablet Take 1 tablet (10 mg total) by mouth every 6 (six) hours as needed (Nausea or vomiting).  [name] REXULTI 3 MG TABS 3 mg every morning.    No facility-administered encounter medications on file as of 02/28/2019.     PHYSICAL EXAM:   General: obese chronically ill, O2 dependent female Cardiovascular: regular rate and rhythm Pulmonary: clear ant fields Abdomen: ascites, tight + bowel sounds GU: no suprapubic tenderness Extremities: + edema, no joint deformities Skin: no rashes Neurological: Weakness but otherwise nonfocal  Christin Ihor Gully, NP

## 2019-03-01 ENCOUNTER — Telehealth: Payer: Self-pay | Admitting: *Deleted

## 2019-03-01 ENCOUNTER — Other Ambulatory Visit: Payer: Self-pay | Admitting: *Deleted

## 2019-03-01 DIAGNOSIS — R188 Other ascites: Secondary | ICD-10-CM

## 2019-03-01 DIAGNOSIS — C787 Secondary malignant neoplasm of liver and intrahepatic bile duct: Principal | ICD-10-CM

## 2019-03-01 DIAGNOSIS — C221 Intrahepatic bile duct carcinoma: Secondary | ICD-10-CM

## 2019-03-01 NOTE — Telephone Encounter (Signed)
We will need to arrange usg to see if she has enough fluid to take out. Preferably paracentesis to be done at the same setting. Can you please look into this? Thanks, Astrid Divine

## 2019-03-01 NOTE — Telephone Encounter (Signed)
Called the nursing home at [facility] several times today with no luck getting any ready on the phone third try I was sent to the assistant director and left a voicemail on her message stating that the patient is due for paracentesis tomorrow needs [facility] admitting registration desk at 1230.  Called back later this evening and got Langley Gauss on the phone and told her that she was scheduled for paracentesis due to the fluid buildup and that she should be over to the medical mall at the [facility] 1230 tomorrow she has taken my telephone number down just in case if they do not have transportation but otherwise they know about the appointment and will try to take care of it

## 2019-03-01 NOTE — Telephone Encounter (Signed)
Patients daughter called to report that Palliative Care is requesting that the patient receive paracentesis. Please call daughter with appointment time.

## 2019-03-02 ENCOUNTER — Other Ambulatory Visit: Payer: Self-pay

## 2019-03-02 ENCOUNTER — Telehealth: Payer: Self-pay | Admitting: *Deleted

## 2019-03-02 ENCOUNTER — Ambulatory Visit
Admission: RE | Admit: 2019-03-02 | Discharge: 2019-03-02 | Disposition: A | Discharge status: Home / Self Care | Payer: Medicare HMO | Source: Ambulatory Visit | Attending: Oncology | Admitting: Oncology

## 2019-03-02 DIAGNOSIS — C221 Intrahepatic bile duct carcinoma: Secondary | ICD-10-CM | POA: Diagnosis not present

## 2019-03-02 DIAGNOSIS — R188 Other ascites: Secondary | ICD-10-CM | POA: Insufficient documentation

## 2019-03-02 DIAGNOSIS — C787 Secondary malignant neoplasm of liver and intrahepatic bile duct: Secondary | ICD-10-CM | POA: Diagnosis present

## 2019-03-02 NOTE — Procedures (Signed)
Interventional Radiology Procedure:   Indications: Cholangiocarcinoma and ascites  Procedure: US guided paracentesis  Findings: Removed 3.1 liters  Complications: None     EBL: None  Plan: Discharge to home.     Donovan Persley R. Anselm Pancoast, MD  Pager: [phone]

## 2019-03-02 NOTE — Telephone Encounter (Signed)
She is coming today to get paracentesis arrival 12:30 for 1 pm procedure

## 2019-03-02 NOTE — Telephone Encounter (Signed)
Called the answering service and the answering service called me stating that the patient needs transportation back to [facility] after her procedure today.  Had already scheduled that to be done through [facility] yesterday.  Called [facility] and spoke to [place].  She said that she was at work yesterday but came in today and she went and asked the patient was the daughter taking her over to the hospital today or did she need [facility] transportation and the patient said her daughter would take her.  Her Tosha canceled the transportation.  Philis Nettle did say that they have transportation set up for Monday but because the patient had said her daughter was taking her her daughter will has taken her to the hospital and her daughter will have to bring her back to the nursing home.  Philis Nettle that the patient needs to be transported each time by [facility] Lei it is too difficult to get the patient in the car.  I called back to the daughter [name] and told her what had happened today and that when she takes her mom back to the [facility] she should let the staff know again that she should always be transported by [facility] Lei and not by private car.  Daughter will make sure she talks to someone about this

## 2019-03-05 ENCOUNTER — Telehealth: Payer: Self-pay | Admitting: Nurse Practitioner

## 2019-03-05 ENCOUNTER — Inpatient Hospital Stay: Payer: Medicare HMO | Attending: Oncology

## 2019-03-05 ENCOUNTER — Other Ambulatory Visit: Payer: Self-pay

## 2019-03-05 ENCOUNTER — Inpatient Hospital Stay (HOSPITAL_BASED_OUTPATIENT_CLINIC_OR_DEPARTMENT_OTHER): Payer: Medicare HMO | Admitting: Oncology

## 2019-03-05 ENCOUNTER — Encounter: Payer: Self-pay | Admitting: Oncology

## 2019-03-05 ENCOUNTER — Inpatient Hospital Stay (HOSPITAL_BASED_OUTPATIENT_CLINIC_OR_DEPARTMENT_OTHER): Payer: Medicare HMO | Admitting: Hospice and Palliative Medicine

## 2019-03-05 VITALS — BP 98/62 | HR 109 | Temp 98.2°F | Resp 16 | Ht 66.0 in

## 2019-03-05 DIAGNOSIS — C787 Secondary malignant neoplasm of liver and intrahepatic bile duct: Secondary | ICD-10-CM | POA: Diagnosis not present

## 2019-03-05 DIAGNOSIS — Z515 Encounter for palliative care: Secondary | ICD-10-CM

## 2019-03-05 DIAGNOSIS — Z79899 Other long term (current) drug therapy: Secondary | ICD-10-CM

## 2019-03-05 DIAGNOSIS — C786 Secondary malignant neoplasm of retroperitoneum and peritoneum: Secondary | ICD-10-CM | POA: Insufficient documentation

## 2019-03-05 DIAGNOSIS — G893 Neoplasm related pain (acute) (chronic): Secondary | ICD-10-CM | POA: Insufficient documentation

## 2019-03-05 DIAGNOSIS — Z66 Do not resuscitate: Secondary | ICD-10-CM

## 2019-03-05 DIAGNOSIS — R180 Malignant ascites: Secondary | ICD-10-CM | POA: Diagnosis not present

## 2019-03-05 DIAGNOSIS — C221 Intrahepatic bile duct carcinoma: Secondary | ICD-10-CM

## 2019-03-05 DIAGNOSIS — Z7189 Other specified counseling: Secondary | ICD-10-CM

## 2019-03-05 DIAGNOSIS — C801 Malignant (primary) neoplasm, unspecified: Secondary | ICD-10-CM

## 2019-03-05 LAB — COMPREHENSIVE METABOLIC PANEL
ALT: 15 U/L (ref 0–44)
AST: 45 U/L — AB (ref 15–41)
Albumin: 2 g/dL — ABNORMAL LOW (ref 3.5–5.0)
Alkaline Phosphatase: 629 U/L — ABNORMAL HIGH (ref 38–126)
Anion gap: 12 (ref 5–15)
BUN: 16 mg/dL (ref 8–23)
CHLORIDE: 94 mmol/L — AB (ref 98–111)
CO2: 30 mmol/L (ref 22–32)
Calcium: 7.9 mg/dL — ABNORMAL LOW (ref 8.9–10.3)
Creatinine, Ser: 0.63 mg/dL (ref 0.44–1.00)
GFR calc Af Amer: >60 mL/min (ref >60)
GFR calc non Af Amer: >60 mL/min (ref >60)
Glucose, Bld: 145 mg/dL — ABNORMAL HIGH (ref 70–99)
POTASSIUM: 4.2 mmol/L (ref 3.5–5.1)
Sodium: 136 mmol/L (ref 135–145)
Total Bilirubin: 0.9 mg/dL (ref 0.3–1.2)
Total Protein: 6.4 g/dL — ABNORMAL LOW (ref 6.5–8.1)

## 2019-03-05 LAB — CBC WITH DIFFERENTIAL/PLATELET
Abs Immature Granulocytes: 0.13 10*3/uL — ABNORMAL HIGH (ref 0.00–0.07)
Basophils Absolute: 0.1 10*3/uL (ref 0.0–0.1)
Basophils Relative: 1 %
Eosinophils Absolute: 0.1 10*3/uL (ref 0.0–0.5)
Eosinophils Relative: 1 %
HCT: 37.1 % (ref 36.0–46.0)
Hemoglobin: 11 g/dL — ABNORMAL LOW (ref 12.0–15.0)
Immature Granulocytes: 1 %
Lymphocytes Relative: 9 %
Lymphs Abs: 0.9 10*3/uL (ref 0.7–4.0)
MCH: 25.6 pg — ABNORMAL LOW (ref 26.0–34.0)
MCHC: 29.6 g/dL — ABNORMAL LOW (ref 30.0–36.0)
MCV: 86.3 fL (ref 80.0–100.0)
MONOS PCT: 9 %
Monocytes Absolute: 0.9 10*3/uL (ref 0.1–1.0)
Neutro Abs: 7.5 10*3/uL (ref 1.7–7.7)
Neutrophils Relative %: 79 %
Platelets: 543 10*3/uL — ABNORMAL HIGH (ref 150–400)
RBC: 4.3 MIL/uL (ref 3.87–5.11)
RDW: 16.9 % — ABNORMAL HIGH (ref 11.5–15.5)
WBC: 9.5 10*3/uL (ref 4.0–10.5)
nRBC: 0 % (ref 0.0–0.2)

## 2019-03-05 LAB — CYTOLOGY - NON PAP

## 2019-03-05 NOTE — Progress Notes (Signed)
Patient here for follow up. She is currently in rehab for strengthening.

## 2019-03-05 NOTE — Telephone Encounter (Signed)
I came to see Ms Kaylee Wolf and she's currently out for an appointment for follow-up visit for palliative care

## 2019-03-05 NOTE — Progress Notes (Signed)
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  She was found to have an elevated bilirubin of 3.3.  Patient had CT chest abdomen and pelvis done in the ER which showed intrahepatic biliary dilatation and thickened enhancing CBD concerning for cholangiocarcinoma. Indistinct right liver masses favor metastatic disease over early biliary abscesses. Nodular omentum most consistent with peritoneal carcinomatosis.  ERCP showed a malignant appearing stricture and brushings were positive for adenocarcinoma but site of malignancy could not be ascertained due to limited specimen.  Bilirubin normalized after stent placement    Interval history-she is able to make some slow progress at the nursing home.  She still requires assistance with ambulation.  She was able to bathe herself this morning.  Reports having abdominal pain when she tries to move around.  She has been taking 2-3 doses of oxycodone during the day but does report feeling uncomfortable throughout the day.  She does not like the diabetic diet that is offered at the rehab.  ECOG PS- 3 Pain scale- 4 Opioid associated constipation- no  Review of systems- Review of Systems  Constitutional: Positive for malaise/fatigue. Negative for chills, fever and weight loss.  HENT: Negative for congestion, ear discharge and nosebleeds.   Eyes:  Negative for blurred vision.  Respiratory: Positive for shortness of breath. Negative for cough, hemoptysis, sputum production and wheezing.   Cardiovascular: Negative for chest pain, palpitations, orthopnea and claudication.  Gastrointestinal: Positive for abdominal pain. Negative for blood in stool, constipation, diarrhea, heartburn, melena, nausea and vomiting.  Genitourinary: Negative for dysuria, flank pain, frequency, hematuria and urgency.  Musculoskeletal: Negative for back pain, joint pain and myalgias.  Skin: Negative for rash.  Neurological: Negative for dizziness, tingling, focal weakness, seizures, weakness and headaches.  Endo/Heme/Allergies: Does not bruise/bleed easily.  Psychiatric/Behavioral: Negative for depression and suicidal ideas. The patient does not have insomnia.       No Known Allergies   Past Medical History:  Diagnosis Date  . Anxiety   . Colon polyp 11/29/2012   at [facility].  . Insomnia   . Mixed hyperlipidemia   . Recurrent major depressive disorder ([place])   . RLS (restless legs syndrome)   . Seizures ([place])   . Tinea pedis of both feet   . Type 2 diabetes mellitus ([place]) 2014?  [name] Varicose veins      Past Surgical History:  Procedure Laterality Date  . COLONOSCOPY  11/29/2012   Dr [name]  . ERCP N/A 02/01/2019   Procedure: ENDOSCOPIC RETROGRADE CHOLANGIOPANCREATOGRAPHY (ERCP);  Surgeon: [name], MD;  Location: [facility] ENDOSCOPY;  Service: Endoscopy;  Laterality: N/A;  . none    . PORTA CATH INSERTION N/A 02/15/2019   Procedure: PORTA CATH INSERTION;  Surgeon: Algernon Huxley, MD;  Location: [facility];  Service: Cardiovascular;  Laterality: N/A;    Social History   Socioeconomic History  . Marital status: Widowed    Spouse name:  Not on file  . Number of children: 1  . Years of education: Not on file  . Highest education level: Not on file  Occupational History  . Not on file  Social Needs    . Financial resource strain: Patient refused  . Food insecurity:    Worry: Patient refused    Inability: Patient refused  . Transportation needs:    Medical: Patient refused    Non-medical: Patient refused  Tobacco Use  . Smoking status: Never Smoker  . Smokeless tobacco: Never Used  Substance and Sexual Activity  . Alcohol use: No    Alcohol/week: 0.0 standard drinks  . Drug use: No  . Sexual activity: Not Currently  Lifestyle  . Physical activity:    Days per week: Patient refused    Minutes per session: Patient refused  . Stress: Patient refused  Relationships  . Social connections:    Talks on phone: Patient refused    Gets together: Patient refused    Attends religious service: Patient refused    Active member of club or organization: Patient refused    Attends meetings of clubs or organizations: Patient refused    Relationship status: Patient refused  . Intimate partner violence:    Fear of current or ex partner: Patient refused    Emotionally abused: Patient refused    Physically abused: Patient refused    Forced sexual activity: Patient refused  Other Topics Concern  . Not on file  Social History Narrative   Lives with daughter, karen    Family History  Problem Relation Age of Onset  . Diabetes Mother   . Heart disease Mother   . Cancer Father   . Diabetes Brother   . Hypertension Brother   . Stroke Brother   . Hypertension Brother   . Breast cancer Neg Hx      Current Outpatient Medications:  .  acetaminophen (TYLENOL) 325 MG tablet, Take 2 tablets (650 mg total) by mouth every 8 (eight) hours as needed for mild pain (or Fever >/= 101)., Disp: , Rfl:  .  aspirin EC 81 MG EC tablet, Take 1 tablet (81 mg total) by mouth daily., Disp: 30 tablet, Rfl: 0 .  atorvastatin (LIPITOR) 20 MG tablet, Take 1 tablet (20 mg total) by mouth daily., Disp: 90 tablet, Rfl: 3 .  blood glucose meter kit and supplies KIT, Dispense based on patient and insurance  preference. Check blood glucose once daily., Disp: 1 each, Rfl: 0 .  Cholecalciferol (VITAMIN D-3) 1000 UNITS CAPS, Take 1 capsule by mouth daily. , Disp: , Rfl:  .  clonazePAM (KLONOPIN) 0.5 MG tablet, Take 1 tablet (0.5 mg total) by mouth at bedtime., Disp: 15 tablet, Rfl: 0 .  dexamethasone (DECADRON) 4 MG tablet, Take 2 tablets by mouth once a day on the day after chemotherapy and then take 2 tablets two times a day for 2 days. Take with food., Disp: 30 tablet, Rfl: 1 .  divalproex (DEPAKOTE) 125 MG DR tablet, Take 1 pill in am and 2 pill at night, Disp: 90 tablet, Rfl: 1 .  escitalopram (LEXAPRO) 20 MG tablet, Take 1 tablet (20 mg total) by mouth daily., Disp: 30 tablet, Rfl: 11 .  furosemide (LASIX) 40 MG tablet, Take 40 mg by mouth daily. , Disp: , Rfl:  .  gabapentin (NEURONTIN) 400 MG capsule, Take 1 capsule (400 mg total) by mouth 2 (two) times daily., Disp: 180 capsule, Rfl: 3 .  lamoTRIgine (LAMICTAL) 150 MG tablet,  Take 150 mg by mouth 2 (two) times daily. , Disp: , Rfl:  .  levothyroxine (SYNTHROID, LEVOTHROID) 50 MCG tablet, Take 50 mcg by mouth daily before breakfast. , Disp: , Rfl:  .  lidocaine-prilocaine (EMLA) cream, Apply to affected area once, Disp: 30 g, Rfl: 3 .  linaclotide (LINZESS) 72 MCG capsule, Take 72 mcg by mouth as needed., Disp: , Rfl:  .  metFORMIN (GLUCOPHAGE) 500 MG tablet, Take 1 tablet (500 mg total) by mouth 2 (two) times daily., Disp: 180 tablet, Rfl: 3 .  Multiple Vitamin (MULTIVITAMIN) tablet, Take 1 tablet by mouth daily., Disp: , Rfl:  .  omeprazole (PRILOSEC) 20 MG capsule, Take 1 capsule (20 mg total) by mouth daily. Before 30 min before first meal of day, Disp: 30 capsule, Rfl: 5 .  ondansetron (ZOFRAN) 8 MG tablet, Take 1 tablet (8 mg total) by mouth 2 (two) times daily as needed. Start on the third day after chemotherapy., Disp: 30 tablet, Rfl: 1 .  ondansetron (ZOFRAN-ODT) 4 MG disintegrating tablet, Take 1 tablet (4 mg total) by mouth 2 (two) times  daily., Disp: 20 tablet, Rfl: 1 .  oxyCODONE (OXY IR/ROXICODONE) 5 MG immediate release tablet, Take 1 tablet (5 mg total) by mouth every 6 (six) hours as needed for moderate pain., Disp: 20 tablet, Rfl: 0 .  phenytoin (DILANTIN) 200 MG ER capsule, Take 100 mg by mouth 2 (two) times daily. , Disp: , Rfl:  .  polyethylene glycol (MIRALAX / GLYCOLAX) packet, Take 17 g by mouth daily as needed for mild constipation., Disp: 14 each, Rfl: 0 .  potassium chloride (K-DUR,KLOR-CON) 10 MEQ tablet, Take 10 mEq by mouth once. , Disp: , Rfl:  .  prochlorperazine (COMPAZINE) 10 MG tablet, Take 1 tablet (10 mg total) by mouth every 6 (six) hours as needed (Nausea or vomiting)., Disp: 30 tablet, Rfl: 1 .  REXULTI 3 MG TABS, 3 mg every morning. , Disp: , Rfl:   Physical exam:  Vitals:   03/05/19 1425 03/05/19 1432  BP: 98/62 98/62  Pulse:  (!) 109  Resp: 16   Temp:  98.2 F (36.8 C)  TempSrc:  Tympanic  Height: _0  (1.676 m)    Physical Exam Constitutional:      Comments: Appears fatigued and older than stated age.  She is on oxygen.  Sitting in a wheelchair  HENT:     Head: Normocephalic and atraumatic.  Eyes:     Pupils: Pupils are equal, round, and reactive to light.  Neck:     Musculoskeletal: Normal range of motion.  Cardiovascular:     Rate and Rhythm: Normal rate and regular rhythm.     Heart sounds: Normal heart sounds.  Pulmonary:     Effort: Pulmonary effort is normal.     Breath sounds: Normal breath sounds.  Abdominal:     General: Bowel sounds are normal. There is distension.     Palpations: Abdomen is soft.  Skin:    General: Skin is warm and dry.  Neurological:     Mental Status: She is alert and oriented to person, place, and time.      CMP Latest Ref Rng & Units 03/05/2019  Glucose 70 - 99 mg/dL 145(H)  BUN 8 - 23 mg/dL 16  Creatinine 0.44 - 1.00 mg/dL 0.63  Sodium 135 - 145 mmol/L 136  Potassium 3.5 - 5.1 mmol/L 4.2  Chloride 98 - 111 mmol/L 94(L)  CO2 22 - 32  mmol/L 30  Calcium 8.9 - 10.3 mg/dL 7.9(L)  Total Protein 6.5 - 8.1 g/dL 6.4(L)  Total Bilirubin 0.3 - 1.2 mg/dL 0.9  Alkaline Phos 38 - 126 U/L 629(H)  AST 15 - 41 U/L 45(H)  ALT 0 - 44 U/L 15   CBC Latest Ref Rng & Units 03/05/2019  WBC 4.0 - 10.5 K/uL 9.5  Hemoglobin 12.0 - 15.0 g/dL 11.0(L)  Hematocrit 36.0 - 46.0 % 37.1  Platelets 150 - 400 K/uL 543(H)    No images are attached to the encounter.  Ct Angio Chest Pe W And/or Wo Contrast  Result Date: 02/20/2019 CLINICAL DATA:  68 y/o F; PE suspected, intermediate prob, positive D-dimer. EXAM: CT ANGIOGRAPHY CHEST WITH CONTRAST TECHNIQUE: Multidetector CT imaging of the chest was performed using the standard protocol during bolus administration of intravenous contrast. Multiplanar CT image reconstructions and MIPs were obtained to evaluate the vascular anatomy. CONTRAST:  One hundred thirty-five OMNIPAQUE IOHEXOL 350 MG/ML SOLN COMPARISON:  02/01/2019 CT chest, abdomen pelvis. FINDINGS: Cardiovascular: Multiple attempted acquisitions. Motion artifact. Satisfactory opacification of the pulmonary arteries to the segmental level. No pulmonary embolus identified. Satisfactory opacification of the thoracic aorta no aortic dissection or aneurysm. Mild cardiomegaly heart size. No pericardial effusion. Right port catheter tip projects over lower SVC. Coronary artery and aortic calcific atherosclerosis. Mediastinum/Nodes: No enlarged mediastinal, hilar, or axillary lymph nodes. Thyroid gland, trachea, and esophagus demonstrate no significant findings. Lungs/Pleura: Small right and trace left pleural effusions. Platelike atelectasis in the left lower lobe. Small area of consolidation the base of right lower lobe. No pneumothorax. Few clustered 2-3 mm nodules at the right lung apex are stable. Upper Abdomen: Increased ascites in the upper abdomen with loculations along the liver surface as well as pneumobilia, incompletely visualized. Musculoskeletal: No  chest wall abnormality. No acute or significant osseous findings. Review of the MIP images confirms the above findings. IMPRESSION: 1. Motion artifact. No pulmonary embolus identified. 2. Small right and trace left pleural effusions. 3. Small area of consolidation in the right lower lobe may represent pneumonia or atelectasis. 4. Coronary artery and aortic calcific atherosclerosis. 5. Increased ascites in the upper abdomen with loculations along the liver surface as well as pneumobilia, incompletely visualized. Electronically Signed   By: Kristine Garbe M.D.   On: 02/20/2019 01:14   Mr Liver W Wo Contrast  Result Date: 02/06/2019 CLINICAL DATA:  68 year old female with history of abnormal CT scan concerning for potential cholangiocarcinoma. Follow-up study. EXAM: MRI ABDOMEN WITHOUT AND WITH CONTRAST TECHNIQUE: Multiplanar multisequence MR imaging of the abdomen was performed both before and after the administration of intravenous contrast. CONTRAST:  8 mL of Gadavist. COMPARISON:  No prior abdominal MRI. CT the chest, abdomen and pelvis 02/01/2019. FINDINGS: Comment: Today's study is limited by considerable patient respiratory motion. Lower chest: T2 signal intensity dependently in the lower right hemithorax, compatible with a small to moderate right pleural effusion, likely with some associated passive atelectasis in the right lower lobe. Hepatobiliary: There are multiple hepatic lesions predominantly throughout the right lobe of the liver. The largest of these is in segment 6 (axial image 26 of series 5) measuring 2.4 x 3.7 cm. These lesions are all T1 hypointense, heterogeneously T2 hyperintense, demonstrate diffusion restriction, and are hypovascular with peripheral enhancement on post gadolinium imaging, highly concerning for metastatic lesions. MRCP imaging was not performed. However, the previously noted intrahepatic biliary ductal dilatation evident on CT examination 02/01/2019 is no longer  noted on today's study. Mild T2 hyperintensity in the periportal  aspects of the hepatic parenchyma, suggesting periportal edema. There is a subtle signal void in the common bile duct, likely reflective of an indwelling common bile duct stent. Some dependent T2 hypointensity and multiple well-defined rounded T2 hypo intensities are noted within the gallbladder, compatible with a combination of biliary sludge and gallstones. Gallbladder does not appear distended. Gallbladder wall does not appear thickened. Pancreas: No pancreatic mass. No pancreatic ductal dilatation. No pancreatic or peripancreatic fluid or inflammatory changes. Spleen: Well-defined T1 hypointense, T2 hyperintense, nonenhancing lesion in the anterior aspect of the spleen, corresponding to rim calcified structure on recent CT examination, likely sequela of remote splenic trauma. Spleen is otherwise unremarkable in appearance. Adrenals/Urinary Tract: Bilateral kidneys and adrenal glands are normal in appearance. No hydroureteronephrosis in the visualized portions of the abdomen. Stomach/Bowel: Visualized portions are unremarkable. Vascular/Lymphatic: No aneurysm identified in the visualized abdominal vasculature. Mildly enlarged portacaval lymph node measuring up to 1.6 cm in short axis (axial image forty-nine of series 13). Other:  Small volume of ascites most evident inferior to the liver. Musculoskeletal: No aggressive appearing osseous lesions are noted in the visualized portions of the skeleton. IMPRESSION: 1. Multiple malignant appearing lesions throughout the right lobe of the liver, most likely to reflect metastatic lesions. This is associated with some portacaval lymphadenopathy. 2. Previously noted intrahepatic biliary ductal dilatation has resolved following placement of common bile duct stent and sphincterotomy. 3. Cholelithiasis and biliary sludge in the gallbladder. No findings to suggest an acute cholecystitis at this time. 4. Trace  volume of ascites. Electronically Signed   By: Vinnie Langton M.D.   On: 02/06/2019 06:42   US Abdomen Limited  Result Date: 02/23/2019 CLINICAL DATA:  Ascites, abdominal distension, assess for paracentesis EXAM: LIMITED ABDOMEN ULTRASOUND FOR ASCITES TECHNIQUE: Limited ultrasound survey for ascites was performed in all four abdominal quadrants. COMPARISON:  02/20/2019 FINDINGS: Very small amount of left lower quadrant ascites, not enough to warrant therapeutic paracentesis. Remainder of the abdomen demonstrates no significant ascites. Procedure not performed. IMPRESSION: Small amount of left lower quadrant ascites by ultrasound. Electronically Signed   By: Jerilynn Mages.  Shick M.D.   On: 02/23/2019 09:50   US Paracentesis  Result Date: 03/02/2019 INDICATION: 68 year old with cholangiocarcinoma and ascites. EXAM: ULTRASOUND GUIDED PARACENTESIS MEDICATIONS: None. COMPLICATIONS: None immediate. PROCEDURE: Informed written consent was obtained from the patient after a discussion of the risks, benefits and alternatives to treatment. A timeout was performed prior to the initiation of the procedure. Initial ultrasound scanning demonstrates a large amount of ascites within the left lower abdominal quadrant. The left lower abdomen was prepped and draped in the usual sterile fashion. 1% lidocaine was used for local anesthesia. Following this, a 6 Fr Safe-T-Centesis catheter was introduced. An ultrasound image was saved for documentation purposes. The paracentesis was performed. The catheter was removed and a dressing was applied. The patient tolerated the procedure well without immediate post procedural complication. FINDINGS: A total of approximately 3.1 L of brown colored fluid was removed. IMPRESSION: Successful ultrasound-guided paracentesis yielding 3.1 liters of peritoneal fluid. Electronically Signed   By: Markus Daft M.D.   On: 03/02/2019 15:37   US Paracentesis  Result Date: 02/20/2019 INDICATION:  Cholangiocarcinoma. Abdominal distention and ascites. Request for diagnostic and therapeutic paracentesis. EXAM: ULTRASOUND GUIDED LEFT LOWER QUADRANT PARACENTESIS MEDICATIONS: None. COMPLICATIONS: None immediate. PROCEDURE: Informed written consent was obtained from the patient after a discussion of the risks, benefits and alternatives to treatment. A timeout was performed prior to the initiation of the procedure. Initial  ultrasound scanning demonstrates a moderate amount of ascites within the left lower abdominal quadrant. The left lower abdomen was prepped and draped in the usual sterile fashion. 1% lidocaine with epinephrine was used for local anesthesia. Following this, a 6 Fr Safe-T-Centesis catheter was introduced. An ultrasound image was saved for documentation purposes. The paracentesis was performed. The catheter was removed and a dressing was applied. The patient tolerated the procedure well without immediate post procedural complication. FINDINGS: A total of approximately 2.8 L of clear yellow fluid was removed. Samples were sent to the laboratory as requested by the clinical team. IMPRESSION: Successful ultrasound-guided paracentesis yielding 2.8 liters of peritoneal fluid. Read by: Ascencion Dike PA-C Electronically Signed   By: Sandi Mariscal M.D.   On: 02/20/2019 11:17   Dg Chest Port 1 View  Result Date: 02/19/2019 CLINICAL DATA:  Sepsis, vomiting EXAM: PORTABLE CHEST 1 VIEW COMPARISON:  Chest CT 02/01/2019 FINDINGS: Right Port-A-Cath in place with the tip in the SVC. Low lung volumes with bibasilar atelectasis and small effusions, right greater than left. Heart is normal size. No acute bony abnormality. IMPRESSION: Low lung volumes with bibasilar atelectasis and small effusions, right greater than left. Electronically Signed   By: Rolm Baptise M.D.   On: 02/19/2019 23:35   US Abdomen Limited Ruq  Result Date: 02/20/2019 CLINICAL DATA:  68 y/o F; right upper quadrant abdominal pain with  pneumobilia. History of metastatic liver cancer and cholangiocarcinoma. EXAM: ULTRASOUND ABDOMEN LIMITED RIGHT UPPER QUADRANT COMPARISON:  02/06/2019 MRI of the abdomen FINDINGS: Gallbladder: Gallbladder sludge and stones measuring up to 1.9 cm. No gallbladder wall thickening. Negative sonographic Murphy's sign. Common bile duct: Diameter: 5.6 mm.  Common bile duct pneumobilia. Liver: Pneumobilia predominantly in the left lobe. Numerous ill-defined liver masses seen within the right lobe of liver better characterized on the prior MRI of the abdomen. Loculated fluid collections along the surface of the right lobe of liver. Portal vein is patent on color Doppler imaging with normal direction of blood flow towards the liver. IMPRESSION: 1. Intrahepatic and common bile duct pneumobilia. 2. Cholelithiasis and gallbladder sludge. No findings of acute cholecystitis. 3. Ill-defined masses in right lobe of liver better characterized on prior MRI. 4. Loculated fluid collections along the surface of right lobe of liver. Electronically Signed   By: Kristine Garbe M.D.   On: 02/20/2019 04:49     Assessment and plan- Patient is a 68 y.o. female with metastatic cholangiocarcinoma and peritoneal carcinomatosis as well as liver metastases yet to start treatment  Patient was supposed to start treatment 2 weeks ago but then presented to the hospital with worsening weakness and shortness of breath.  She was treated for healthcare associated pneumonia and is currently at a subacute rehab.  I discussed with the patient and her daughter that we need to see what kind of progress patient is able to make over the next 10 days.  If she is able to make a meaningful progress to the point that she is at least able to ambulate independently we could consider palliative chemotherapy at that point.  However if her performance status does not improve or continues to deteriorate hospice would be a reasonable option at that  time.  Neoplasm related pain: I will add OxyContin 10 mg twice daily and she will continue oxycodone 10 mg every 4 hours as needed for pain.  Also recommended doing MiraLAX and senna for opioid associated constipation.  Patient does have ascites secondary to peritoneal carcinomatosis which has  been building up relatively soon.  She did undergo paracentesis 4 days ago and 3 L of fluid were drained.  She again feels bloated today.  We will plan for weekly paracentesis at this time.  I will see her back next week on the day of her paracentesis to see how she is doing overall  Patient can go on a regular diet instead of her diabetic diet at this time since she does not like that and has been eating poorly at the rehab.  I also recommend that she should take Glucerna 1-2 times a day to improve her nutritional intake   Total face to face encounter time for this patient visit was 30 min. >50% of the time was  spent in counseling and coordination of care.     Visit Diagnosis 1. Cholangiocarcinoma metastatic to liver ([place])   2. Peritoneal carcinomatosis ([place])   3. Malignant ascites      Dr. Randa Evens, MD, MPH [facility] [phone] 03/05/2019 4:00 PM

## 2019-03-05 NOTE — Progress Notes (Signed)
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 Patient was hospitalized 02/20/2019 to 02/23/19 with generalized weakness.Patient was found to have possible pneumonia and worsening ascites. Patient has required weekly paracentesis for ascites management. Palliative care was consulted to help address goals.  SOCIAL HISTORY:     reports that she has never smoked. She has never used smokeless tobacco. She reports that she does not drink alcohol or use drugs.  Patient is not married. She has a daughter who is involved in her care. Patient is currently at [facility] for rehab.   ADVANCE DIRECTIVES:  Not on file  CODE STATUS: DNR  PAST MEDICAL HISTORY: Past Medical History:  Diagnosis Date  . Anxiety   . Colon polyp 11/29/2012   at [place].  . Insomnia   . Mixed hyperlipidemia   . Recurrent major depressive disorder ([place])   . RLS (restless legs syndrome)   . Seizures ([place])   . Tinea pedis of both feet   . Type 2 diabetes mellitus ([place]) 2014?  [name] Varicose veins     PAST SURGICAL HISTORY:  Past Surgical History:  Procedure Laterality Date  . COLONOSCOPY  11/29/2012   Dr [name]  . ERCP N/A 02/01/2019   Procedure: ENDOSCOPIC RETROGRADE CHOLANGIOPANCREATOGRAPHY (ERCP);  Surgeon: [name], MD;  Location: [facility] ENDOSCOPY;  Service: Endoscopy;  Laterality: N/A;  . none    . PORTA CATH INSERTION N/A 02/15/2019   Procedure: PORTA CATH INSERTION;  Surgeon: Algernon Huxley, MD;  Location: [facility] LAB;  Service:  Cardiovascular;  Laterality: N/A;    HEMATOLOGY/ONCOLOGY HISTORY:    Cholangiocarcinoma metastatic to liver ([place])   02/08/2019 Cancer Staging    Staging form: Distal Bile Duct, AJCC 8th Edition - Clinical stage from 02/08/2019: Stage IV (cTX, cN1, cM1) - Signed by Sindy Guadeloupe, MD on 02/12/2019    02/12/2019 Initial Diagnosis    Cholangiocarcinoma metastatic to liver ([place])    02/22/2019 -  Chemotherapy    The patient had palonosetron (ALOXI) injection 0.25 mg, 0.25 mg, Intravenous,  Once, 0 of 4 cycles CISplatin (PLATINOL) 52 mg in sodium chloride 0.9 % 250 mL chemo infusion, 25 mg/m2, Intravenous,  Once, 0 of 4 cycles gemcitabine (GEMZAR) 2,052 mg in sodium chloride 0.9 % 250 mL chemo infusion, 1,000 mg/m2, Intravenous,  Once, 0 of 4 cycles fosaprepitant (EMEND) 150 mg, dexamethasone (DECADRON) 12 mg in sodium chloride 0.9 % 145 mL IVPB, , Intravenous,  Once, 0 of 4 cycles  for chemotherapy treatment.      ALLERGIES:  has No Known Allergies.  MEDICATIONS:  Current Outpatient Medications  Medication Sig Dispense Refill  . acetaminophen (TYLENOL) 325 MG tablet Take 2 tablets (650 mg total) by mouth every 8 (eight) hours as needed for mild pain (or Fever >/= 101).    [name] aspirin EC 81 MG EC tablet Take 1 tablet (81 mg total) by mouth daily. 30 tablet 0  . atorvastatin (LIPITOR) 20 MG tablet Take 1 tablet (20 mg total) by mouth daily. 90 tablet 3  . blood glucose meter kit and supplies KIT Dispense based on patient and insurance preference.  Check blood glucose once daily. 1 each 0  . Cholecalciferol (VITAMIN D-3) 1000 UNITS CAPS Take 1 capsule by mouth daily.     . clonazePAM (KLONOPIN) 0.5 MG tablet Take 1 tablet (0.5 mg total) by mouth at bedtime. 15 tablet 0  . dexamethasone (DECADRON) 4 MG tablet Take 2 tablets by mouth once a day on the day after chemotherapy and then take 2 tablets two times a day for 2 days. Take with food. 30 tablet 1  . divalproex (DEPAKOTE) 125 MG DR tablet Take 1  pill in am and 2 pill at night 90 tablet 1  . escitalopram (LEXAPRO) 20 MG tablet Take 1 tablet (20 mg total) by mouth daily. 30 tablet 11  . furosemide (LASIX) 40 MG tablet Take 40 mg by mouth daily.     [name] gabapentin (NEURONTIN) 400 MG capsule Take 1 capsule (400 mg total) by mouth 2 (two) times daily. 180 capsule 3  . lamoTRIgine (LAMICTAL) 150 MG tablet Take 150 mg by mouth 2 (two) times daily.     [name] levothyroxine (SYNTHROID, LEVOTHROID) 50 MCG tablet Take 50 mcg by mouth daily before breakfast.     . lidocaine-prilocaine (EMLA) cream Apply to affected area once 30 g 3  . linaclotide (LINZESS) 72 MCG capsule Take 72 mcg by mouth as needed.    . metFORMIN (GLUCOPHAGE) 500 MG tablet Take 1 tablet (500 mg total) by mouth 2 (two) times daily. 180 tablet 3  . Multiple Vitamin (MULTIVITAMIN) tablet Take 1 tablet by mouth daily.    [name] omeprazole (PRILOSEC) 20 MG capsule Take 1 capsule (20 mg total) by mouth daily. Before 30 min before first meal of day 30 capsule 5  . ondansetron (ZOFRAN) 8 MG tablet Take 1 tablet (8 mg total) by mouth 2 (two) times daily as needed. Start on the third day after chemotherapy. 30 tablet 1  . ondansetron (ZOFRAN-ODT) 4 MG disintegrating tablet Take 1 tablet (4 mg total) by mouth 2 (two) times daily. 20 tablet 1  . oxyCODONE (OXY IR/ROXICODONE) 5 MG immediate release tablet Take 1 tablet (5 mg total) by mouth every 6 (six) hours as needed for moderate pain. 20 tablet 0  . phenytoin (DILANTIN) 200 MG ER capsule Take 100 mg by mouth 2 (two) times daily.     . polyethylene glycol (MIRALAX / GLYCOLAX) packet Take 17 g by mouth daily as needed for mild constipation. 14 each 0  . potassium chloride (K-DUR,KLOR-CON) 10 MEQ tablet Take 10 mEq by mouth once.     . prochlorperazine (COMPAZINE) 10 MG tablet Take 1 tablet (10 mg total) by mouth every 6 (six) hours as needed (Nausea or vomiting). 30 tablet 1  . REXULTI 3 MG TABS 3 mg every morning.      No current  facility-administered medications for this visit.     VITAL SIGNS: LMP 05/10/2012  There were no vitals filed for this visit.  Estimated body mass index is 31.92 kg/m as calculated from the following:   Height as of an earlier encounter on 03/05/19: '5\' 6"'  (1.676 m).   Weight as of 02/19/19: 197 lb 12 oz (89.7 kg).  LABS: CBC:    Component Value Date/Time   WBC 9.5 03/05/2019 1301   HGB 11.0 (L) 03/05/2019 1301   HGB 13.9 04/17/2015 2020   HCT 37.1 03/05/2019 1301   HCT 41.1 04/17/2015 2020   PLT 543 (H) 03/05/2019 1301   PLT 196 04/17/2015 2020   MCV 86.3 03/05/2019 1301  MCV 89 04/17/2015 2020   NEUTROABS 7.5 03/05/2019 1301   LYMPHSABS 0.9 03/05/2019 1301   MONOABS 0.9 03/05/2019 1301   EOSABS 0.1 03/05/2019 1301   BASOSABS 0.1 03/05/2019 1301   Comprehensive Metabolic Panel:    Component Value Date/Time   NA 136 03/05/2019 1301   NA 142 04/17/2015 2020   K 4.2 03/05/2019 1301   K 3.8 04/17/2015 2020   CL 94 (L) 03/05/2019 1301   CL 107 04/17/2015 2020   CO2 30 03/05/2019 1301   CO2 29 04/17/2015 2020   BUN 16 03/05/2019 1301   BUN 17 04/17/2015 2020   CREATININE 0.63 03/05/2019 1301   CREATININE 0.72 09/14/2016 1504   GLUCOSE 145 (H) 03/05/2019 1301   GLUCOSE 149 (H) 04/17/2015 2020   CALCIUM 7.9 (L) 03/05/2019 1301   CALCIUM 9.2 04/17/2015 2020   AST 45 (H) 03/05/2019 1301   AST 20 04/17/2015 2020   ALT 15 03/05/2019 1301   ALT 14 04/17/2015 2020   ALKPHOS 629 (H) 03/05/2019 1301   ALKPHOS 108 04/17/2015 2020   BILITOT 0.9 03/05/2019 1301   BILITOT 0.2 (L) 04/17/2015 2020   PROT 6.4 (L) 03/05/2019 1301   PROT 7.1 04/17/2015 2020   ALBUMIN 2.0 (L) 03/05/2019 1301   ALBUMIN 4.4 04/17/2015 2020    RADIOGRAPHIC STUDIES: Ct Angio Chest Pe W And/or Wo Contrast  Result Date: 02/20/2019 CLINICAL DATA:  68 y/o F; PE suspected, intermediate prob, positive D-dimer. EXAM: CT ANGIOGRAPHY CHEST WITH CONTRAST TECHNIQUE: Multidetector CT imaging of the chest was  performed using the standard protocol during bolus administration of intravenous contrast. Multiplanar CT image reconstructions and MIPs were obtained to evaluate the vascular anatomy. CONTRAST:  One hundred thirty-five OMNIPAQUE IOHEXOL 350 MG/ML SOLN COMPARISON:  02/01/2019 CT chest, abdomen pelvis. FINDINGS: Cardiovascular: Multiple attempted acquisitions. Motion artifact. Satisfactory opacification of the pulmonary arteries to the segmental level. No pulmonary embolus identified. Satisfactory opacification of the thoracic aorta no aortic dissection or aneurysm. Mild cardiomegaly heart size. No pericardial effusion. Right port catheter tip projects over lower SVC. Coronary artery and aortic calcific atherosclerosis. Mediastinum/Nodes: No enlarged mediastinal, hilar, or axillary lymph nodes. Thyroid gland, trachea, and esophagus demonstrate no significant findings. Lungs/Pleura: Small right and trace left pleural effusions. Platelike atelectasis in the left lower lobe. Small area of consolidation the base of right lower lobe. No pneumothorax. Few clustered 2-3 mm nodules at the right lung apex are stable. Upper Abdomen: Increased ascites in the upper abdomen with loculations along the liver surface as well as pneumobilia, incompletely visualized. Musculoskeletal: No chest wall abnormality. No acute or significant osseous findings. Review of the MIP images confirms the above findings. IMPRESSION: 1. Motion artifact. No pulmonary embolus identified. 2. Small right and trace left pleural effusions. 3. Small area of consolidation in the right lower lobe may represent pneumonia or atelectasis. 4. Coronary artery and aortic calcific atherosclerosis. 5. Increased ascites in the upper abdomen with loculations along the liver surface as well as pneumobilia, incompletely visualized. Electronically Signed   By: Kristine Garbe M.D.   On: 02/20/2019 01:14   Mr Liver W Wo Contrast  Result Date: 02/06/2019 CLINICAL  DATA:  68 year old female with history of abnormal CT scan concerning for potential cholangiocarcinoma. Follow-up study. EXAM: MRI ABDOMEN WITHOUT AND WITH CONTRAST TECHNIQUE: Multiplanar multisequence MR imaging of the abdomen was performed both before and after the administration of intravenous contrast. CONTRAST:  8 mL of Gadavist. COMPARISON:  No prior abdominal MRI. CT the chest, abdomen and  pelvis 02/01/2019. FINDINGS: Comment: Today's study is limited by considerable patient respiratory motion. Lower chest: T2 signal intensity dependently in the lower right hemithorax, compatible with a small to moderate right pleural effusion, likely with some associated passive atelectasis in the right lower lobe. Hepatobiliary: There are multiple hepatic lesions predominantly throughout the right lobe of the liver. The largest of these is in segment 6 (axial image 26 of series 5) measuring 2.4 x 3.7 cm. These lesions are all T1 hypointense, heterogeneously T2 hyperintense, demonstrate diffusion restriction, and are hypovascular with peripheral enhancement on post gadolinium imaging, highly concerning for metastatic lesions. MRCP imaging was not performed. However, the previously noted intrahepatic biliary ductal dilatation evident on CT examination 02/01/2019 is no longer noted on today's study. Mild T2 hyperintensity in the periportal aspects of the hepatic parenchyma, suggesting periportal edema. There is a subtle signal void in the common bile duct, likely reflective of an indwelling common bile duct stent. Some dependent T2 hypointensity and multiple well-defined rounded T2 hypo intensities are noted within the gallbladder, compatible with a combination of biliary sludge and gallstones. Gallbladder does not appear distended. Gallbladder wall does not appear thickened. Pancreas: No pancreatic mass. No pancreatic ductal dilatation. No pancreatic or peripancreatic fluid or inflammatory changes. Spleen: Well-defined T1  hypointense, T2 hyperintense, nonenhancing lesion in the anterior aspect of the spleen, corresponding to rim calcified structure on recent CT examination, likely sequela of remote splenic trauma. Spleen is otherwise unremarkable in appearance. Adrenals/Urinary Tract: Bilateral kidneys and adrenal glands are normal in appearance. No hydroureteronephrosis in the visualized portions of the abdomen. Stomach/Bowel: Visualized portions are unremarkable. Vascular/Lymphatic: No aneurysm identified in the visualized abdominal vasculature. Mildly enlarged portacaval lymph node measuring up to 1.6 cm in short axis (axial image forty-nine of series 13). Other:  Small volume of ascites most evident inferior to the liver. Musculoskeletal: No aggressive appearing osseous lesions are noted in the visualized portions of the skeleton. IMPRESSION: 1. Multiple malignant appearing lesions throughout the right lobe of the liver, most likely to reflect metastatic lesions. This is associated with some portacaval lymphadenopathy. 2. Previously noted intrahepatic biliary ductal dilatation has resolved following placement of common bile duct stent and sphincterotomy. 3. Cholelithiasis and biliary sludge in the gallbladder. No findings to suggest an acute cholecystitis at this time. 4. Trace volume of ascites. Electronically Signed   By: Vinnie Langton M.D.   On: 02/06/2019 06:42   US Abdomen Limited  Result Date: 02/23/2019 CLINICAL DATA:  Ascites, abdominal distension, assess for paracentesis EXAM: LIMITED ABDOMEN ULTRASOUND FOR ASCITES TECHNIQUE: Limited ultrasound survey for ascites was performed in all four abdominal quadrants. COMPARISON:  02/20/2019 FINDINGS: Very small amount of left lower quadrant ascites, not enough to warrant therapeutic paracentesis. Remainder of the abdomen demonstrates no significant ascites. Procedure not performed. IMPRESSION: Small amount of left lower quadrant ascites by ultrasound. Electronically  Signed   By: Jerilynn Mages.  Shick M.D.   On: 02/23/2019 09:50   US Paracentesis  Result Date: 03/02/2019 INDICATION: 68 year old with cholangiocarcinoma and ascites. EXAM: ULTRASOUND GUIDED PARACENTESIS MEDICATIONS: None. COMPLICATIONS: None immediate. PROCEDURE: Informed written consent was obtained from the patient after a discussion of the risks, benefits and alternatives to treatment. A timeout was performed prior to the initiation of the procedure. Initial ultrasound scanning demonstrates a large amount of ascites within the left lower abdominal quadrant. The left lower abdomen was prepped and draped in the usual sterile fashion. 1% lidocaine was used for local anesthesia. Following this, a 6 Fr Safe-T-Centesis catheter  was introduced. An ultrasound image was saved for documentation purposes. The paracentesis was performed. The catheter was removed and a dressing was applied. The patient tolerated the procedure well without immediate post procedural complication. FINDINGS: A total of approximately 3.1 L of brown colored fluid was removed. IMPRESSION: Successful ultrasound-guided paracentesis yielding 3.1 liters of peritoneal fluid. Electronically Signed   By: Markus Daft M.D.   On: 03/02/2019 15:37   US Paracentesis  Result Date: 02/20/2019 INDICATION: Cholangiocarcinoma. Abdominal distention and ascites. Request for diagnostic and therapeutic paracentesis. EXAM: ULTRASOUND GUIDED LEFT LOWER QUADRANT PARACENTESIS MEDICATIONS: None. COMPLICATIONS: None immediate. PROCEDURE: Informed written consent was obtained from the patient after a discussion of the risks, benefits and alternatives to treatment. A timeout was performed prior to the initiation of the procedure. Initial ultrasound scanning demonstrates a moderate amount of ascites within the left lower abdominal quadrant. The left lower abdomen was prepped and draped in the usual sterile fashion. 1% lidocaine with epinephrine was used for local anesthesia.  Following this, a 6 Fr Safe-T-Centesis catheter was introduced. An ultrasound image was saved for documentation purposes. The paracentesis was performed. The catheter was removed and a dressing was applied. The patient tolerated the procedure well without immediate post procedural complication. FINDINGS: A total of approximately 2.8 L of clear yellow fluid was removed. Samples were sent to the laboratory as requested by the clinical team. IMPRESSION: Successful ultrasound-guided paracentesis yielding 2.8 liters of peritoneal fluid. Read by: Ascencion Dike PA-C Electronically Signed   By: Sandi Mariscal M.D.   On: 02/20/2019 11:17   Dg Chest Port 1 View  Result Date: 02/19/2019 CLINICAL DATA:  Sepsis, vomiting EXAM: PORTABLE CHEST 1 VIEW COMPARISON:  Chest CT 02/01/2019 FINDINGS: Right Port-A-Cath in place with the tip in the SVC. Low lung volumes with bibasilar atelectasis and small effusions, right greater than left. Heart is normal size. No acute bony abnormality. IMPRESSION: Low lung volumes with bibasilar atelectasis and small effusions, right greater than left. Electronically Signed   By: Rolm Baptise M.D.   On: 02/19/2019 23:35   US Abdomen Limited Ruq  Result Date: 02/20/2019 CLINICAL DATA:  68 y/o F; right upper quadrant abdominal pain with pneumobilia. History of metastatic liver cancer and cholangiocarcinoma. EXAM: ULTRASOUND ABDOMEN LIMITED RIGHT UPPER QUADRANT COMPARISON:  02/06/2019 MRI of the abdomen FINDINGS: Gallbladder: Gallbladder sludge and stones measuring up to 1.9 cm. No gallbladder wall thickening. Negative sonographic Murphy's sign. Common bile duct: Diameter: 5.6 mm.  Common bile duct pneumobilia. Liver: Pneumobilia predominantly in the left lobe. Numerous ill-defined liver masses seen within the right lobe of liver better characterized on the prior MRI of the abdomen. Loculated fluid collections along the surface of the right lobe of liver. Portal vein is patent on color Doppler imaging  with normal direction of blood flow towards the liver. IMPRESSION: 1. Intrahepatic and common bile duct pneumobilia. 2. Cholelithiasis and gallbladder sludge. No findings of acute cholecystitis. 3. Ill-defined masses in right lobe of liver better characterized on prior MRI. 4. Loculated fluid collections along the surface of right lobe of liver. Electronically Signed   By: Kristine Garbe M.D.   On: 02/20/2019 04:49    PERFORMANCE STATUS (ECOG) : 3 - Symptomatic, >50% confined to bed  REVIEW OF SYSTEMS: As noted above. Otherwise, a complete review of systems is negative.  PHYSICAL EXAM: General: NAD, frail appearing, thin, in wheelchair Cardiovascular: regular rate and rhythm Pulmonary: clear ant fields Abdomen: distended, +fluid wave GU: no suprapubic tenderness Extremities: trace edema  Skin: no rashes Neurological: Weakness, some confusion  IMPRESSION: Patient known to me from the hospital.  I met with patient, daughter, and a friend today for follow-up.  Patient has been at [facility] for rehab since discharging from the hospital.  She has been followed there by palliative care.   Daughter says the patient has been doing reasonably well at rehab.  Patient has been able to participate with transfers and appears to be slowly improving in regards to strength.  Symptomatically, patient has had abdominal pain that is improved with PRN oxycodone but it is unclear how frequently she is receiving medications at the facility.  Patient is being started on OxyContin per Dr. Janese Banks.  Patient's last paracentesis was earlier this week on 03/02/2019 with approximately 3.1 L removed.  She is being scheduled again for paracentesis next week.  Patient will see Dr. Janese Banks next week to further discuss options for treatment.  We again discussed CODE STATUS today.  Patient says she defers decision-making to her daughter and daughter does not feel that patient would want to be  resuscitated.   I completed a MOST form today. The patient and family outlined their wishes for the following treatment decisions:  Cardiopulmonary Resuscitation: Do Not Attempt Resuscitation (DNR/No CPR)  Medical Interventions: Limited Additional Interventions: Use medical treatment, IV fluids and cardiac monitoring as indicated, DO NOT USE intubation or mechanical ventilation. May consider use of less invasive airway support such as BiPAP or CPAP. Also provide comfort measures. Transfer to the hospital if indicated. Avoid intensive care.   Antibiotics: Determine use of limitation of antibiotics when infection occurs  IV Fluids: IV fluids for a defined trial period  Feeding Tube: Feeding tube for a defined trial period   Case and plan discussed with Dr. Janese Banks.   I called and spoke with Natalia Leatherwood, NP at the facility.   PLAN: -Treatment plan per Dr. Janese Banks -Weekly paracentesis -Agree with starting OxyContin 61m q12H -Continue oxycodone 554mQ6H PRN for BTP -DNR -MOST form completed today and sent back to facility with daughter   Patient expressed understanding and was in agreement with this plan. She also understands that She can call clinic at any time with any questions, concerns, or complaints.    Time Total: 45 minutes  Visit consisted of counseling and education dealing with the complex and emotionally intense issues of symptom management and palliative care in the setting of serious and potentially life-threatening illness.Greater than 50%  of this time was spent counseling and coordinating care related to the above assessment and plan.  Signed by: JoAltha HarmPhD, NP-C [phone] Cell)

## 2019-03-06 ENCOUNTER — Telehealth: Payer: Self-pay

## 2019-03-06 ENCOUNTER — Non-Acute Institutional Stay: Payer: Medicare HMO | Admitting: Nurse Practitioner

## 2019-03-06 ENCOUNTER — Encounter: Payer: Self-pay | Admitting: Nurse Practitioner

## 2019-03-06 VITALS — HR 88 | Resp 18

## 2019-03-06 DIAGNOSIS — R0602 Shortness of breath: Secondary | ICD-10-CM

## 2019-03-06 DIAGNOSIS — Z515 Encounter for palliative care: Secondary | ICD-10-CM

## 2019-03-06 DIAGNOSIS — G893 Neoplasm related pain (acute) (chronic): Secondary | ICD-10-CM | POA: Insufficient documentation

## 2019-03-06 DIAGNOSIS — R601 Generalized edema: Secondary | ICD-10-CM | POA: Insufficient documentation

## 2019-03-06 NOTE — Telephone Encounter (Signed)
Call to [facility] health care [phone]. Spoke w Lillia Carmel staff member . Related to her pt paracentesis plans- 03/09/2019 -arrive [facility] medical mall 10 am,procedure at 1030 am.  Procedure repeated on 03/16/2019 instructions - 1230-arrival to medical mall,and procedure at 1 pm. On that day pt has an MD appt at [place] w Dr Gregary Cromer acknowledged understanding of above details.

## 2019-03-06 NOTE — Progress Notes (Signed)
[employer] Palliative Care Consult Note Telephone: [phone]  Fax: [phone]  PATIENT NAME: Kaylee Wolf DOB: Apr 02, 1951 MRN: [id number]  PRIMARY CARE PROVIDER:   Remi Haggard, FNP  REFERRING PROVIDER:  Dr Hodges/[facility] RESPONSIBLE PARTY:  daughter Izela Altier at [phone] or sister Eulah Citizen at [phone] or [phone]    RECOMMENDATIONS and PLAN:  1. Palliative care encounter Z51.5; Palliative medicine team will continue to support patient, patient's family, and medical team. Visit consisted of counseling and education dealing with the complex and emotionally intense issues of symptom management and palliative care in the setting of serious and potentially life-threatening illness  2. 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 Chronic pain G89.29 monitor on pain scale, monitor efficacy vs adverse side effects. Continue to take Oxycontin 10 mg bid for long-acting and Oxycodone 5 mg 4 hours for breakthrough pain   ASSESSMENT:     I visited and observed Kaylee Wolf. We talked about purpose or palliative care visit. We talked about how she's feeling today. We talked about symptoms of pain, shortness of breath. We talked about paracentesis. We talked about therapy, weakness. She did appear somewhat confused during palliative care visit. She was comforted with assessment. We talked to better appetite continuing to be poor. We talked about therapy. She shared that she's not up to doing therapy today. We talked about getting out of bed and she shared that she is tired. She did go back to sleep during palliative care visit intermittently. Emotional  support provided. Shared with Kaylee Wolf will contact her [name] for update on palliative care visit and Kaylee Wolf an agreement.  I called Kaylee Wolf, Kaylee Wolf's daughter. We talked about palliative care visit today. We talked about clinical condition. We talked about last paracentesis. We talked about progression of chronic disease in the setting of metastatic. We talked about therapy. We talked about oncology plan of giving her 10 days to see if she is able to gain strength back enough to walk and possibly discuss palliative chemotherapy. We also discussed hospice if she is not strong enough at that time. We talked about symptoms, appetite. We talked about progression with therapy. We talked about weakness and fatigue with realistic expectations of what she truly is able to do. We talked about reoccurring ascites. Kaylee Wolf endorses it appears like the fluid, ascities is reaccumulate in fairly quickly. We talked about upcoming paracentesis and possibly for their discussion of thorax. Kaylee Wolf endorses her father did have a pleurx drain as the same diagnosis as her mom and died at home with Hospice Services. We talked about role of palliative care. Goals are to focus on comfort today Kaylee Wolf does appear comfortable in the setting of severe debility, metastatic disease. Emotional support in therapeutic listening provided. Questions answered to satisfaction. Discuss with Kaylee Wolf will follow them one week if needed or sooner should she declining care in an agreement.  I spent 60 minutes providing this consultation,  From 10:30amto 11:30am. More than 50% of the time in this consultation was spent coordinating communication.   HISTORY OF PRESENT ILLNESS:  Kaylee Wolf is a 68 y.o. 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 Hospitalize 2 /6/ 2020 to 02/06/2019 with generalized abdominal pain, cough, increase seizure-like activity with rhinorrhea. She was taking Mucinex but was not helping. After seizure activity she became confused. Workup significant for new cholangiocarcinoma with metastasis to liver and peritoneal carcinomatosis, liver transaminases trending down s/p ERCP and biliary stenting 2/7 / 2020. A biliary sphincterotomy was performed. Cells for cytology obtained upper third of main bile duct during ercp with recommendation for outpatient liver biopsy. MRI of liver reveals multiple malignant appearing lesions in the liver. Dr. Janese Banks is oncologist who consulted. Epilepsy is not been taking medication for three weeks because she was not feeling well and continued on home Depakote, Gabapentin, Dilantin, Lamictal. Initial outpatient oncology visit 2 / 13 / 2020 and plan was to start first dose of palliative gemcitabine and cisplatin on 2/27 / 2020. Ultrasound of right upper quadrant reveals cholelithiasis and gallbladder sludge though no acute cholecystitis. Ill-defined masses in the right upper lobe of liver.Hepatic and common bile duct pnemobilia. Plan is to start that chemotherapy after discharge from hospital. Normocytic anemia with thrombocytosis likely secondary to malignancy. Seizure disorder Depakote with subject therapeutic levels. Blood cultures negative though possible. She was re hospitalized 2 / 24 / 2020 to 2 / 28 / 2020 for weakness secondary to pneumonia suspected community-acquired treated with antibiotic therapy and improved during hospitalization. General weakness multifactorial secondary to deconditioning with underlying malignancy and pneumonia. She did have a ultrasound-guided paracentesis with 2.5 L of fluid removed. Cholangiocarcinoma recently diagnosed but not initial treatment yet discussion with oncologist obtained. She was seen by palliative care during  hospitalization and made a do not resuscitate. She was made a DNR. She was discharged to short-term rehab at [facility] where she remains.Kaylee. Mannes continues to reside short-term rehab. She did go for paracentesis and now scheduled for weekly. She was seen by Billey Chang palliative care provider at the [facility] yesterday. She was placed on Oxycontin 10 mg bid for long-acting and Oxycodone 5 mg 4 hours for breakthrough pain. Oxycodone 5 mg / 24 hours. She continues to require assistance for transfers, adl's. She continues to have some intermittent confusion. Therapy endorses that she is slow to progress. Per Oncology note plan to continue work with therapy for 10 days and see if it is possible for her to be ambulatory. If she does become ambulatory will revisit palliative chemotherapy. If not then will further discuss hospice service options. She does continue to have ongoing weakness, debility and easily fatigued. At present she is lying in bed. She appears chronically ill but currently comfortable. No visitors present. Palliative Care was asked to help address goals of care.   CODE STATUS: DNR  PPS: 30% HOSPICE ELIGIBILITY/DIAGNOSIS: TBD  PAST MEDICAL HISTORY:  Past Medical History:  Diagnosis Date  . Anxiety   . Colon polyp 11/29/2012   at [place].  . Insomnia   . Mixed hyperlipidemia   . Recurrent major depressive disorder ([place])   . RLS (restless legs syndrome)   . Seizures ([place])   . Tinea pedis of both feet   . Type 2 diabetes mellitus ([place]) 2014?  [name] Varicose veins     SOCIAL HX:  Social History   Tobacco Use  . Smoking status: Never Smoker  . Smokeless tobacco: Never Used  Substance Use Topics  . Alcohol use: No    Alcohol/week: 0.0 standard drinks  ALLERGIES: No Known Allergies   PERTINENT MEDICATIONS:  Outpatient Encounter Medications as of 03/06/2019  Medication Sig  . acetaminophen (TYLENOL) 325 MG tablet  Take 2 tablets (650 mg total) by mouth every 8 (eight) hours as needed for mild pain (or Fever >/= 101).  [name] aspirin EC 81 MG EC tablet Take 1 tablet (81 mg total) by mouth daily.  [name] atorvastatin (LIPITOR) 20 MG tablet Take 1 tablet (20 mg total) by mouth daily.  . blood glucose meter kit and supplies KIT Dispense based on patient and insurance preference. Check blood glucose once daily.  . Cholecalciferol (VITAMIN D-3) 1000 UNITS CAPS Take 1 capsule by mouth daily.   . clonazePAM (KLONOPIN) 0.5 MG tablet Take 1 tablet (0.5 mg total) by mouth at bedtime.  [name] dexamethasone (DECADRON) 4 MG tablet Take 2 tablets by mouth once a day on the day after chemotherapy and then take 2 tablets two times a day for 2 days. Take with food.  . divalproex (DEPAKOTE) 125 MG DR tablet Take 1 pill in am and 2 pill at night  . escitalopram (LEXAPRO) 20 MG tablet Take 1 tablet (20 mg total) by mouth daily.  . furosemide (LASIX) 40 MG tablet Take 40 mg by mouth daily.   [name] gabapentin (NEURONTIN) 400 MG capsule Take 1 capsule (400 mg total) by mouth 2 (two) times daily.  [name] lamoTRIgine (LAMICTAL) 150 MG tablet Take 150 mg by mouth 2 (two) times daily.   [name] levothyroxine (SYNTHROID, LEVOTHROID) 50 MCG tablet Take 50 mcg by mouth daily before breakfast.   . lidocaine-prilocaine (EMLA) cream Apply to affected area once  . linaclotide (LINZESS) 72 MCG capsule Take 72 mcg by mouth as needed.  . metFORMIN (GLUCOPHAGE) 500 MG tablet Take 1 tablet (500 mg total) by mouth 2 (two) times daily.  . Multiple Vitamin (MULTIVITAMIN) tablet Take 1 tablet by mouth daily.  [name] omeprazole (PRILOSEC) 20 MG capsule Take 1 capsule (20 mg total) by mouth daily. Before 30 min before first meal of day  . ondansetron (ZOFRAN) 8 MG tablet Take 1 tablet (8 mg total) by mouth 2 (two) times daily as needed. Start on the third day after chemotherapy.  . ondansetron (ZOFRAN-ODT) 4 MG disintegrating tablet Take 1 tablet (4 mg total) by mouth 2 (two) times  daily.  [name] oxyCODONE (OXY IR/ROXICODONE) 5 MG immediate release tablet Take 1 tablet (5 mg total) by mouth every 6 (six) hours as needed for moderate pain.  . phenytoin (DILANTIN) 200 MG ER capsule Take 100 mg by mouth 2 (two) times daily.   . polyethylene glycol (MIRALAX / GLYCOLAX) packet Take 17 g by mouth daily as needed for mild constipation.  . potassium chloride (K-DUR,KLOR-CON) 10 MEQ tablet Take 10 mEq by mouth once.   . prochlorperazine (COMPAZINE) 10 MG tablet Take 1 tablet (10 mg total) by mouth every 6 (six) hours as needed (Nausea or vomiting).  [name] REXULTI 3 MG TABS 3 mg every morning.    No facility-administered encounter medications on file as of 03/06/2019.     PHYSICAL EXAM:   General: NAD, weak, chronically ill female Cardiovascular: regular rate and rhythm Pulmonary: clear ant fields Abdomen: round, ascities +few BS GU: no suprapubic tenderness Extremities: +edema, no joint deformities Skin: no rashes Neurological: Weakness but otherwise nonfocal  Deontre Allsup Ihor Gully, NP

## 2019-03-07 ENCOUNTER — Telehealth: Payer: Self-pay | Admitting: *Deleted

## 2019-03-07 ENCOUNTER — Other Ambulatory Visit: Payer: Self-pay

## 2019-03-07 ENCOUNTER — Telehealth: Payer: Self-pay | Admitting: Nurse Practitioner

## 2019-03-07 ENCOUNTER — Other Ambulatory Visit: Payer: Self-pay | Admitting: *Deleted

## 2019-03-07 MED ORDER — SENNA 8.6 MG PO TABS: 1.0000 | ORAL_TABLET | Freq: Two times a day (BID) | ORAL | 0 refills | Status: AC

## 2019-03-07 MED ORDER — OXYCODONE HCL ER 10 MG PO T12A: 10.0000 mg | EXTENDED_RELEASE_TABLET | Freq: Two times a day (BID) | ORAL | 0 refills | Status: AC

## 2019-03-07 NOTE — Telephone Encounter (Signed)
Kaylee Glad Ms Uy's daughter called for update. She shared that the prescription for Oxycontin did not reach the facility [facility]. Encourage Kaylee Wolf to contact the cancer center to update need for prescription.  Total time spent 15 minutes Documentation 5 minutes Phone discussion 10 minutes

## 2019-03-07 NOTE — Telephone Encounter (Signed)
Called [facility] and spoke to [place] and she got my fax but she needs hard copy rx faxed to them. I then got a hard copy and faxed to them. I called the daughter and told her all of this also.

## 2019-03-07 NOTE — Telephone Encounter (Signed)
Kaylee Wolf called reporting that a 12 hour pain medicine was to be ordered for this patient and [facility] has nothing regarding that order.Please advise  "Neoplasm related pain: I will add OxyContin 10 mg twice daily and she will continue oxycodone 10 mg every 4 hours as needed for pain.  Also recommended doing MiraLAX and senna for opioid associated constipation."  Dr. Randa Evens, MD, MPH [facility] [phone] 03/05/2019 4:00 PM

## 2019-03-08 ENCOUNTER — Telehealth: Payer: Self-pay | Admitting: *Deleted

## 2019-03-08 ENCOUNTER — Inpatient Hospital Stay: Payer: Medicare HMO | Admitting: Oncology

## 2019-03-08 NOTE — Telephone Encounter (Signed)
Daughter called back today because she had gotten a call about moving the appointment up for her mom today to earlier time however I had talked to her earlier today and actually made the appointment on the wrong day.  I called her and let her know that the appointments and that today is good to be on the 20th at 3:00 and she now knows how the area was made and she is fine with that and they will be coming on the 20th

## 2019-03-08 NOTE — Telephone Encounter (Signed)
She can get a private room or room that her mother can be: Since she is such a hot natured person.  Told her she had have to call the nursing home and speak to the director and nursing in charge and talk to them about it.  Also got a call from [place] and the nurse stating she never got the hard copy of the OxyContin I called her back before I ever called the patient's daughter back and they found it and they are going to get the patient her med

## 2019-03-09 ENCOUNTER — Other Ambulatory Visit: Payer: Self-pay

## 2019-03-09 ENCOUNTER — Other Ambulatory Visit: Payer: Self-pay | Admitting: Oncology

## 2019-03-09 ENCOUNTER — Ambulatory Visit
Admission: RE | Admit: 2019-03-09 | Discharge: 2019-03-09 | Disposition: A | Discharge status: Home / Self Care | Payer: Medicare HMO | Source: Ambulatory Visit | Attending: Family Medicine | Admitting: Family Medicine

## 2019-03-09 DIAGNOSIS — C221 Intrahepatic bile duct carcinoma: Secondary | ICD-10-CM | POA: Diagnosis not present

## 2019-03-09 DIAGNOSIS — C801 Malignant (primary) neoplasm, unspecified: Secondary | ICD-10-CM

## 2019-03-09 DIAGNOSIS — C787 Secondary malignant neoplasm of liver and intrahepatic bile duct: Secondary | ICD-10-CM | POA: Insufficient documentation

## 2019-03-09 DIAGNOSIS — C786 Secondary malignant neoplasm of retroperitoneum and peritoneum: Secondary | ICD-10-CM

## 2019-03-09 DIAGNOSIS — R180 Malignant ascites: Secondary | ICD-10-CM

## 2019-03-13 ENCOUNTER — Telehealth: Payer: Self-pay | Admitting: *Deleted

## 2019-03-13 NOTE — Telephone Encounter (Signed)
duplicate

## 2019-03-13 NOTE — Telephone Encounter (Signed)
I called and spoke to daughter who is torn. She prays for patient to get bette but she really feels she is not. I called and spoke to nursing home the social worker supervisor Desiree and she states that she is at a plateau with no new improvement and from a care needs- the patient needs more help now that before and based on that the insurance states patient be released on this Friday.  I spoke to daughter who wanted me to talk to her aunt Izora Gala and they both feel like the daughter-Karen can't take care of her at home. [name] has 2 part time jobs and kids and does not have strength nor money to care for at home. She needs hospice.  I said I will ask tomorrow to see what choices are out there. I told [name] that she needs to let [facility] know that she can't take her home.

## 2019-03-13 NOTE — Telephone Encounter (Signed)
I dont think we can make decisions as to how long a patient can stay at the rehab. It would be up to [place] I would think

## 2019-03-13 NOTE — Telephone Encounter (Signed)
Dr. Janese Banks wanted me to call [facility] and asked them if patient can come to cancer center before the paracentesis on Friday instead of after the procedure. I called [facility] and spoke to nurse Aniceto Boss and she said it was fine. She will make the changes and have patient at front desk to register at 11:30 for 11:45 appt. I then called daughter [name] to tell her also about the change and that [facility] would be able to accommodate it . The daughter [name] did not pick up so I left this message on her voicemail.

## 2019-03-13 NOTE — Telephone Encounter (Signed)
Daughter called reporting that [facility] is planning to discharge patient Friday and [name] wants Dr Janese Banks to ask for another 20 days of rehab because she is starting to make improvements. Please return her call [phone]

## 2019-03-14 ENCOUNTER — Other Ambulatory Visit: Payer: Self-pay | Admitting: *Deleted

## 2019-03-14 ENCOUNTER — Telehealth: Payer: Self-pay | Admitting: Nurse Practitioner

## 2019-03-14 DIAGNOSIS — R180 Malignant ascites: Secondary | ICD-10-CM

## 2019-03-14 DIAGNOSIS — C221 Intrahepatic bile duct carcinoma: Secondary | ICD-10-CM

## 2019-03-14 DIAGNOSIS — C787 Secondary malignant neoplasm of liver and intrahepatic bile duct: Principal | ICD-10-CM

## 2019-03-14 NOTE — Progress Notes (Signed)
imgimg

## 2019-03-14 NOTE — Telephone Encounter (Signed)
Kaylee Wolf was Kaylee Wolf's daughter called with update. Kaylee Wolf endorses they were talking about putting a continuous pleurx drain rather than having [name] go to [facility] weekly for paracentesis. Kaylee Wolf also endure says that she was given a discharge date of Friday which is in two days. She talked about unable to bring her mom home and care for her due to her clinical condition, decrease functional ability, weakness, non-ambulatory with increasing skill level. She does have Medicare and Medicaid. Encourage Kaylee Wolf to talk to Education officer, museum at [facility] and update on concerns and that she would be unable to care for her at home. Kaylee Wolf endorses she hopes that she can transition to a long-term care bed at [facility]. We talked about medical goals of care with focus on Comfort. No further chemotherapy. Therapy days have been completed. We talked about option of Hospice Services and Kaylee Wolf share that she didn't think she would be eligible for hospice though she is based on life expectancy of six months or less and her current clinical condition. We talked about Hospice Home two to three weeks life expectancy. Discuss that will do a palliative follow-up visit tomorrow to assess for hospice home appropriateness. If she is not Hospice Home appropriate will request Hospice Services at facility. Kaylee Wolf endorses she was going to go ahead and call the Education officer, museum and business office at [facility] in update that she is unable to bring her home on the plan discharge day. Therapeutic listening and emotional support provided. Contact information. Questions answered to satisfaction.  Total time spent 40 minutes Documentation 10 minutes Phone discussion 30 minutes

## 2019-03-15 ENCOUNTER — Other Ambulatory Visit: Payer: Self-pay

## 2019-03-15 ENCOUNTER — Telehealth: Payer: Self-pay | Admitting: *Deleted

## 2019-03-15 ENCOUNTER — Non-Acute Institutional Stay: Payer: Medicare HMO | Admitting: Nurse Practitioner

## 2019-03-15 ENCOUNTER — Other Ambulatory Visit: Payer: Self-pay | Admitting: Radiology

## 2019-03-15 ENCOUNTER — Encounter: Payer: Self-pay | Admitting: Nurse Practitioner

## 2019-03-15 VITALS — HR 92 | Resp 20

## 2019-03-15 DIAGNOSIS — Z515 Encounter for palliative care: Secondary | ICD-10-CM

## 2019-03-15 DIAGNOSIS — R609 Edema, unspecified: Secondary | ICD-10-CM

## 2019-03-15 DIAGNOSIS — R0602 Shortness of breath: Secondary | ICD-10-CM

## 2019-03-15 NOTE — Progress Notes (Signed)
Kaylee Wolf Consult Note Telephone: [phone]  Fax: [phone]  PATIENT NAME: Kaylee Wolf DOB: 18-Jul-1951 MRN: [id number]  PRIMARY CARE PROVIDER:   Remi Haggard, FNP  REFERRING PROVIDER:  Remi Haggard, [place], [place] [address]  RESPONSIBLE PARTY:   [name] at [phone] or [name] at [phone] or [phone]   RECOMMENDATIONS and PLAN:  1.Palliative care encounter Z51.5; Palliative medicine team will continue to support patient, patient's family, and medical team. 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 We talked about purpose for palliative care visit. We talked about how she was feeling today. We talked about symptoms of pain but she didn't eyes. We talked about shortness of breath what she does experience with more exertion. She did seem somewhat confused as she thought she was able to ambulate which she is not able to do. We talked about her appetite which has remained poor. She did drinking Ensure. We talked about weekly paracentesis. Facts about role of palliative care and plan of care. She replies that she will be going home tomorrow. We talked about increasing weakness and skill level with challenges of care in the home. Therapeutic listening and emotional support provided.  I called Kaylee Glad, Kaylee. Wolf's daughter. Clinical update  discussed. We talked about palliative care visit with Kaylee Wolf. Kaylee Wolf endorses she is not able to afford the co-pays to keep her at [facility] and that she would have to take her mom home. We talked about increasing skill level and her concern for her being at home. We talked about chronic disease progression in the setting of metastatic disease. We talked about her concerns in providing care. We talked about option of Hospice Services and what they provide. We talked about poor prognosis in life expectancy of likely 6 months or less. Discuss with Kaylee Wolf option of hospice home with life expectancy two to three weeks. Discussed with Kaylee Wolf that Kaylee Wolf would be within two to three weeks of life expectancy should it is decided to discontinue her medications, total comfort care. We talked about paracentesis. We talked about quality of life versus quantity of days. Kaylee Wolf endorses she will need to further discuss with family. Discussed if she decides to bring Kaylee. Wolf home with hospice that there is respite at the hospice home as well as symptom management. Kaylee Wolf talked at length about concerns for caregiver fatigue. Kaylee Wolf shared that she will further discuss with family and notify facility with palliative care of decision of either home with hospice or Hospice Home which would require her to discontinue medications. DNR does remain in place. Therapeutic listening and emotional support provided. Questions answered satisfaction. Contact information provided. I updated staff.  I spent 90 minutes providing this consultation,  from 9:ooam to 10:30am. More than 50% of the time in this consultation was spent coordinating communication.   HISTORY OF PRESENT ILLNESS:  Kaylee Wolf is a 68 y.o. 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 Hospitalize 2 /6/ 2020 to 02/06/2019 with generalized abdominal pain, cough, increase seizure-like activity with rhinorrhea. She was taking Mucinex but was not helping. After seizure activity she became confused. Workup significant for new cholangiocarcinoma with metastasis to liver and peritoneal carcinomatosis, liver transaminases trending down s/p ERCP and biliary stenting 2/7 / 2020. A biliary sphincterotomy was performed. Cells for cytology obtained upper third of main bile duct during ercp with recommendation for outpatient liver biopsy. MRI of liver reveals multiple malignant appearing lesions in the liver. Dr. Janese Banks is oncologist who consulted. Epilepsy is not been taking medication for three weeks because she was not feeling well and continued on home Depakote, Gabapentin, Dilantin, Lamictal. Initial outpatient oncology visit 2 / 13 / 2020 and plan was to start first dose of palliative gemcitabine and cisplatin on 2/27 / 2020. Ultrasound of right upper quadrant reveals cholelithiasis and gallbladder sludge though no acute cholecystitis. Ill-defined masses in the right upper lobe of liver.Hepatic and common bile duct pnemobilia. Plan is to start that chemotherapy after discharge from hospital. Normocytic anemia with thrombocytosis likely secondary to malignancy. Seizure disorder Depakote with subject therapeutic levels. Blood cultures negative though possible.She was re hospitalized 2 / 24 / 2020 to 2 / 28 / 2062frweakness secondary to pneumonia suspected community-acquired treated with antibiotic therapy and improved during hospitalization. General weakness multifactorial secondary to deconditioning with underlying malignancy and pneumonia. She did have a ultrasound-guided paracentesis with 2.5 L of fluid removed. Cholangiocarcinoma recently diagnosed but not initial treatment yet discussion with oncologist obtained. She was seen by palliative  care during hospitalization and made a do not resuscitate. She was made a DNR. [facility]Kaylee. DSheencontinues to reside short-term rehab. Kaylee. DPanoscontinues to reside at short-term rehab which will be completed tomorrow. She has had very little progress with therapy. She requires assistance for transferring, adl's with episodes of incontinence. She is able to feed herself though appetite has continued to be very poor. She does have times where she is more confused per staff. She continues to go to receive weekly paracentesis. She does remain on continuous oxygen. I. present she is lying in bed. She appears chronically ill, debilitated, week. No visitors present. Palliative Care was asked to help address goals of care.   CODE STATUS: DNR  PPS: 30% HOSPICE ELIGIBILITY/DIAGNOSIS: TBD  PAST MEDICAL HISTORY:  Past Medical History:  Diagnosis Date  . Anxiety   . [place]  . Insomnia   . Mixed hyperlipidemia   . Recurrent major depressive disorder ([facility]   . RLS (restless legs syndrome)   . Seizures ([facility]   . Tinea pedis of both feet   . [place] 2 [place]?  .Marland KitchenVaricose veins     SOCIAL HX:  Social History   Tobacco Use  . Smoking status: Never Smoker  . Smokeless tobacco: Never Used  Substance Use Topics  . Alcohol use: No    Alcohol/week: 0.0 standard drinks    ALLERGIES: No Known Allergies   PERTINENT MEDICATIONS:  Outpatient Encounter Medications as of 03/15/2019  Medication Sig  . acetaminophen (TYLENOL) 325 MG tablet Take 2 tablets (650 mg total) by mouth every 8 (eight) hours as needed for mild pain (or Fever >/= 101).  .Marland Kitchenaspirin EC 81 MG EC tablet Take 1 tablet (81  mg total) by mouth daily.  [name] atorvastatin (LIPITOR) 20 MG tablet Take 1 tablet (20 mg total) by mouth daily.  . blood glucose meter kit and supplies KIT  Dispense based on patient and insurance preference. Check blood glucose once daily.  . Cholecalciferol (VITAMIN D-3) 1000 UNITS CAPS Take 1 capsule by mouth daily.   . clonazePAM (KLONOPIN) 0.5 MG tablet Take 1 tablet (0.5 mg total) by mouth at bedtime.  [name] dexamethasone (DECADRON) 4 MG tablet Take 2 tablets by mouth once a day on the day after chemotherapy and then take 2 tablets two times a day for 2 days. Take with food.  . divalproex (DEPAKOTE) 125 MG DR tablet Take 1 pill in am and 2 pill at night  . escitalopram (LEXAPRO) 20 MG tablet Take 1 tablet (20 mg total) by mouth daily.  . furosemide (LASIX) 40 MG tablet Take 40 mg by mouth daily.   [name] gabapentin (NEURONTIN) 400 MG capsule Take 1 capsule (400 mg total) by mouth 2 (two) times daily.  [name] lamoTRIgine (LAMICTAL) 150 MG tablet Take 150 mg by mouth 2 (two) times daily.   [name] levothyroxine (SYNTHROID, LEVOTHROID) 50 MCG tablet Take 50 mcg by mouth daily before breakfast.   . lidocaine-prilocaine (EMLA) cream Apply to affected area once  . linaclotide (LINZESS) 72 MCG capsule Take 72 mcg by mouth as needed.  . metFORMIN (GLUCOPHAGE) 500 MG tablet Take 1 tablet (500 mg total) by mouth 2 (two) times daily.  . Multiple Vitamin (MULTIVITAMIN) tablet Take 1 tablet by mouth daily.  [name] omeprazole (PRILOSEC) 20 MG capsule Take 1 capsule (20 mg total) by mouth daily. Before 30 min before first meal of day  . ondansetron (ZOFRAN) 8 MG tablet Take 1 tablet (8 mg total) by mouth 2 (two) times daily as needed. Start on the third day after chemotherapy.  . ondansetron (ZOFRAN-ODT) 4 MG disintegrating tablet Take 1 tablet (4 mg total) by mouth 2 (two) times daily.  [name] oxyCODONE (OXY IR/ROXICODONE) 5 MG immediate release tablet Take 1 tablet (5 mg total) by mouth every 6 (six) hours as needed for moderate pain.  [name] oxyCODONE (OXYCONTIN) 10 mg 12 hr tablet Take 1 tablet (10 mg total) by mouth every 12 (twelve) hours.  . phenytoin (DILANTIN) 200 MG ER capsule Take  100 mg by mouth 2 (two) times daily.   . polyethylene glycol (MIRALAX / GLYCOLAX) packet Take 17 g by mouth daily as needed for mild constipation.  . potassium chloride (K-DUR,KLOR-CON) 10 MEQ tablet Take 10 mEq by mouth once.   . prochlorperazine (COMPAZINE) 10 MG tablet Take 1 tablet (10 mg total) by mouth every 6 (six) hours as needed (Nausea or vomiting).  [name] REXULTI 3 MG TABS 3 mg every morning.   . senna (SENOKOT) 8.6 MG TABS tablet Take 1 tablet (8.6 mg total) by mouth 2 (two) times daily. If patient develops diarrhea then hold med until diarrhea resolves and restart med   No facility-administered encounter medications on file as of 03/15/2019.     PHYSICAL EXAM:   General: NAD, frail appearing, thin Cardiovascular: regular rate and rhythm Pulmonary: clear ant fields Abdomen: soft, nontender, + bowel sounds GU: no suprapubic tenderness Extremities: no edema, no joint deformities Skin: no rashes Neurological: Weakness but otherwise nonfocal  Christin Ihor Gully, NP

## 2019-03-15 NOTE — Telephone Encounter (Signed)
Called daughter to let her know that in order to get her to come over and have her Tenckhoff catheter for her drainage we will not be able to do tomorrow because she will need a non EMS transportation.  Specials had been having issues with how long it takes for the non emergency transportation to arrive and take patient home.  Department has waited anywhere from 3 to 8 hours for them to come pick patients up.  So therefore we need to try for early morning appointment for the Tenckhoff catheter to be put in and then they have more of the day hours to wait for EMS to come and pick her back up and take her back home after the procedure.  With the agreements with the daughter we are moving the appointment to next Tuesday and she will arrive at 8 AM for a 9:00 procedure.  I have also called [facility] [place] [facility] and let them know that they can cancel the transportation to here tomorrow but will now need to make an on emergent EMS transportation for patient to be taken home once she is discharged tomorrow from [facility].  I spoke to [place] the Education officer, museum about this today as well as I left a message on [place] the transportation person her voicemail today also.  The daughter [name] is aware of all of this above and is agreeing to the plan.  Also feel that a hospice paper in which the daughter is agreeable to that to and I will send it tomorrow once I get Dr. Janese Banks signature and hospice will start services also

## 2019-03-16 ENCOUNTER — Ambulatory Visit: Admission: RE | Admit: 2019-03-16 | Payer: Medicare HMO | Source: Ambulatory Visit

## 2019-03-16 ENCOUNTER — Inpatient Hospital Stay: Payer: Medicare HMO | Admitting: Oncology

## 2019-03-16 ENCOUNTER — Ambulatory Visit: Payer: Medicare HMO

## 2019-03-19 ENCOUNTER — Ambulatory Visit: Payer: Medicare HMO | Admitting: Hospice and Palliative Medicine

## 2019-03-19 ENCOUNTER — Telehealth: Payer: Self-pay | Admitting: *Deleted

## 2019-03-19 ENCOUNTER — Other Ambulatory Visit: Payer: Self-pay | Admitting: Student

## 2019-03-19 NOTE — Telephone Encounter (Signed)
Called ems and checked with them about tom. [facility] to [facility] at 8:30. They can't do that . The best that can get there is 9 am.  I called specialty scheduling and that would not work because the vascular lab is packed and 30 min off schedule will run the rest of patients late for the rest of day,  They would like to move the tube placement to this Thursday.  Arrival at medical mall at 9 am.  I called EMS and they are good for 9 am arrival to medical mall. I then called daughter and she is ok with this too. She wants to make sure that they are able to come to her house that is where patient is now and I said I already gave her the address [place].  Daughter also says when the patient came home from nursing facility that they took her to wrong house and then had problems with the 3 steps that it takes to get in the house to get patient and it took 4 people to take care of this.  I did call back to EMS and address all the issues above and they can handle it. I had told daughter that if there was any problems that I would call her back and there are no issues

## 2019-03-20 ENCOUNTER — Ambulatory Visit: Admission: RE | Admit: 2019-03-20 | Payer: Medicare HMO | Source: Ambulatory Visit

## 2019-03-21 ENCOUNTER — Other Ambulatory Visit: Payer: Self-pay | Admitting: Radiology

## 2019-03-22 ENCOUNTER — Ambulatory Visit
Admission: RE | Admit: 2019-03-22 | Discharge: 2019-03-22 | Disposition: A | Discharge status: Home / Self Care | Source: Ambulatory Visit | Attending: Oncology | Admitting: Oncology

## 2019-03-22 ENCOUNTER — Telehealth: Payer: Self-pay | Admitting: *Deleted

## 2019-03-22 ENCOUNTER — Other Ambulatory Visit: Payer: Self-pay | Admitting: *Deleted

## 2019-03-22 ENCOUNTER — Other Ambulatory Visit: Payer: Self-pay

## 2019-03-22 DIAGNOSIS — E119 Type 2 diabetes mellitus without complications: Secondary | ICD-10-CM | POA: Insufficient documentation

## 2019-03-22 DIAGNOSIS — Z7982 Long term (current) use of aspirin: Secondary | ICD-10-CM | POA: Insufficient documentation

## 2019-03-22 DIAGNOSIS — F329 Major depressive disorder, single episode, unspecified: Secondary | ICD-10-CM | POA: Diagnosis not present

## 2019-03-22 DIAGNOSIS — Z539 Procedure and treatment not carried out, unspecified reason: Secondary | ICD-10-CM | POA: Diagnosis not present

## 2019-03-22 DIAGNOSIS — C787 Secondary malignant neoplasm of liver and intrahepatic bile duct: Principal | ICD-10-CM

## 2019-03-22 DIAGNOSIS — R188 Other ascites: Secondary | ICD-10-CM

## 2019-03-22 DIAGNOSIS — E785 Hyperlipidemia, unspecified: Secondary | ICD-10-CM | POA: Diagnosis not present

## 2019-03-22 DIAGNOSIS — R180 Malignant ascites: Secondary | ICD-10-CM | POA: Diagnosis present

## 2019-03-22 DIAGNOSIS — C221 Intrahepatic bile duct carcinoma: Secondary | ICD-10-CM | POA: Insufficient documentation

## 2019-03-22 DIAGNOSIS — Z79890 Hormone replacement therapy (postmenopausal): Secondary | ICD-10-CM | POA: Diagnosis not present

## 2019-03-22 DIAGNOSIS — Z7984 Long term (current) use of oral hypoglycemic drugs: Secondary | ICD-10-CM | POA: Diagnosis not present

## 2019-03-22 DIAGNOSIS — R569 Unspecified convulsions: Secondary | ICD-10-CM | POA: Insufficient documentation

## 2019-03-22 DIAGNOSIS — Z79899 Other long term (current) drug therapy: Secondary | ICD-10-CM | POA: Diagnosis not present

## 2019-03-22 DIAGNOSIS — G2581 Restless legs syndrome: Secondary | ICD-10-CM | POA: Diagnosis not present

## 2019-03-22 LAB — CBC
HCT: 32.6 % — ABNORMAL LOW (ref 36.0–46.0)
Hemoglobin: 9.6 g/dL — ABNORMAL LOW (ref 12.0–15.0)
MCH: 24.8 pg — ABNORMAL LOW (ref 26.0–34.0)
MCHC: 29.4 g/dL — ABNORMAL LOW (ref 30.0–36.0)
MCV: 84.2 fL (ref 80.0–100.0)
Platelets: 488 10*3/uL — ABNORMAL HIGH (ref 150–400)
RBC: 3.87 MIL/uL (ref 3.87–5.11)
RDW: 17.6 % — AB (ref 11.5–15.5)
WBC: 7.2 10*3/uL (ref 4.0–10.5)
nRBC: 0 % (ref 0.0–0.2)

## 2019-03-22 LAB — APTT: aPTT: 48 seconds — ABNORMAL HIGH (ref 24–36)

## 2019-03-22 LAB — PROTIME-INR
INR: 2.1 — ABNORMAL HIGH (ref 0.8–1.2)
Prothrombin Time: 23.1 seconds — ABNORMAL HIGH (ref 11.4–15.2)

## 2019-03-22 MED ORDER — HEPARIN SOD (PORK) LOCK FLUSH 100 UNIT/ML IV SOLN
INTRAVENOUS | Status: AC
  Filled 2019-03-22: qty 5

## 2019-03-22 MED ORDER — MIDAZOLAM HCL 5 MG/5ML IJ SOLN
INTRAMUSCULAR | Status: AC
  Filled 2019-03-22: qty 5

## 2019-03-22 MED ORDER — CEFAZOLIN SODIUM-DEXTROSE 2-4 GM/100ML-% IV SOLN
INTRAVENOUS | Status: AC
  Filled 2019-03-22: qty 100

## 2019-03-22 MED ORDER — SODIUM CHLORIDE 0.9 % IV SOLN
INTRAVENOUS | Status: DC
  Administered 2019-03-22: 10:00:00 via INTRAVENOUS

## 2019-03-22 MED ORDER — CEFAZOLIN SODIUM-DEXTROSE 2-4 GM/100ML-% IV SOLN
2.0000 g | Freq: Once | INTRAVENOUS | Status: AC
  Administered 2019-03-22: 2 g via INTRAVENOUS
  Filled 2019-03-22: qty 100

## 2019-03-22 MED ORDER — FENTANYL CITRATE (PF) 100 MCG/2ML IJ SOLN
INTRAMUSCULAR | Status: AC
  Filled 2019-03-22: qty 4

## 2019-03-22 NOTE — H&P (Signed)
Chief Complaint: Patient was seen in consultation today for No chief complaint on file.  at the request of [place] [facility]  Referring Physician(s): Rao,Archana C  Supervising Physician: Marybelle Killings  Patient Status: [facility] - Out-pt  History of Present Illness: Kaylee Wolf is a 68 y.o. 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 Her last paracenesthesias was 03/02/19 yielding 3.1 liters fluid. She denies fevers, chills, SOB, cough, malaise. She has had seizures as well and has seen neurology.  Past Medical History:  Diagnosis Date   Anxiety    Colon polyp 11/29/2012   at [facility].   Insomnia    Mixed hyperlipidemia    Recurrent major depressive disorder (HCC)    RLS (restless legs syndrome)    Seizures (HCC)    Tinea pedis of both feet    Type 2 diabetes mellitus ([place]) 2014?   Varicose veins     Past Surgical History:  Procedure Laterality Date   COLONOSCOPY  11/29/2012   Dr [name]   ERCP N/A 02/01/2019   Procedure: ENDOSCOPIC RETROGRADE CHOLANGIOPANCREATOGRAPHY (ERCP);  Surgeon: [name], MD;  Location: [facility] ENDOSCOPY;  Service: Endoscopy;  Laterality: N/A;   none     PORTA CATH INSERTION N/A 02/15/2019   Procedure: PORTA CATH INSERTION;  Surgeon: Algernon Huxley, MD;  Location: [facility];  Service: Cardiovascular;  Laterality: N/A;    Allergies: Patient has no known allergies.  Medications: Prior to Admission medications   Medication Sig Start Date End Date Taking? Authorizing Provider  aspirin EC 81 MG EC tablet Take 1 tablet (81 mg total) by mouth daily. 02/03/18  Yes Wieting, Richard, MD  atorvastatin (LIPITOR) 20 MG tablet Take 1 tablet (20 mg total) by mouth daily. 09/06/17  Yes Karamalegos, Devonne Doughty, DO  Cholecalciferol (VITAMIN D-3) 1000 UNITS CAPS Take 1 capsule by mouth daily.    Yes [provider]  clonazePAM (KLONOPIN) 0.5 MG tablet Take 1 tablet (0.5 mg  total) by mouth at bedtime. 02/23/19  Yes Sainani, Belia Heman, MD  dexamethasone (DECADRON) 4 MG tablet Take 2 tablets by mouth once a day on the day after chemotherapy and then take 2 tablets two times a day for 2 days. Take with food. 02/12/19  Yes Sindy Guadeloupe, MD  divalproex (DEPAKOTE) 125 MG DR tablet Take 1 pill in am and 2 pill at night 02/21/17  Yes Rainey Pines, MD  escitalopram (LEXAPRO) 20 MG tablet Take 1 tablet (20 mg total) by mouth daily. 09/06/17  Yes Karamalegos, Devonne Doughty, DO  furosemide (LASIX) 40 MG tablet Take 40 mg by mouth daily.  01/17/19  Yes [provider]  gabapentin (NEURONTIN) 400 MG capsule Take 1 capsule (400 mg total) by mouth 2 (two) times daily. 09/06/17  Yes Karamalegos, Devonne Doughty, DO  lamoTRIgine (LAMICTAL) 150 MG tablet Take 150 mg by mouth 2 (two) times daily.  07/20/16  Yes [provider]  levothyroxine (SYNTHROID, LEVOTHROID) 50 MCG tablet Take 50 mcg by mouth daily before breakfast.  12/28/18  Yes [provider]  metFORMIN (GLUCOPHAGE) 500 MG tablet Take 1 tablet (500 mg total) by mouth 2 (two) times daily. 09/06/17  Yes Karamalegos, Devonne Doughty, DO  Multiple Vitamin (MULTIVITAMIN) tablet Take 1 tablet by mouth daily.   Yes [provider]  omeprazole (PRILOSEC) 20 MG capsule Take 1 capsule (20 mg total) by mouth daily. Before 30 min before first meal of day 09/06/17  Yes Karamalegos, Devonne Doughty,  DO  ondansetron (ZOFRAN-ODT) 4 MG disintegrating tablet Take 1 tablet (4 mg total) by mouth 2 (two) times daily. 09/06/17  Yes Karamalegos, Devonne Doughty, DO  oxyCODONE (OXYCONTIN) 10 mg 12 hr tablet Take 1 tablet (10 mg total) by mouth every 12 (twelve) hours. 03/07/19  Yes Verlon Au, NP  phenytoin (DILANTIN) 200 MG ER capsule Take 100 mg by mouth 2 (two) times daily.  11/17/15  Yes [provider]  prochlorperazine (COMPAZINE) 10 MG tablet Take 1 tablet (10 mg total) by mouth every 6 (six) hours as needed (Nausea or  vomiting). 02/12/19  Yes Sindy Guadeloupe, MD  REXULTI 3 MG TABS 3 mg every morning.  01/19/19  Yes [provider]  senna (SENOKOT) 8.6 MG TABS tablet Take 1 tablet (8.6 mg total) by mouth 2 (two) times daily. If patient develops diarrhea then hold med until diarrhea resolves and restart med 03/07/19  Yes Verlon Au, NP  acetaminophen (TYLENOL) 325 MG tablet Take 2 tablets (650 mg total) by mouth every 8 (eight) hours as needed for mild pain (or Fever >/= 101). 02/06/19   Nicholes Mango, MD  blood glucose meter kit and supplies KIT Dispense based on patient and insurance preference. Check blood glucose once daily. 11/25/15   Luciana Axe, NP  lidocaine-prilocaine (EMLA) cream Apply to affected area once 02/12/19   Sindy Guadeloupe, MD  linaclotide [facility]) 72 MCG capsule Take 72 mcg by mouth as needed.    [provider]  ondansetron (ZOFRAN) 8 MG tablet Take 1 tablet (8 mg total) by mouth 2 (two) times daily as needed. Start on the third day after chemotherapy. 02/12/19   Sindy Guadeloupe, MD  oxyCODONE (OXY IR/ROXICODONE) 5 MG immediate release tablet Take 1 tablet (5 mg total) by mouth every 6 (six) hours as needed for moderate pain. 02/23/19   Henreitta Leber, MD  polyethylene glycol (MIRALAX / GLYCOLAX) packet Take 17 g by mouth daily as needed for mild constipation. 02/06/19   Gouru, Illene Silver, MD  potassium chloride (K-DUR,KLOR-CON) 10 MEQ tablet Take 10 mEq by mouth once.  10/27/18   [provider]     Family History  Problem Relation Age of Onset   Diabetes Mother    Heart disease Mother    Cancer Father    Diabetes Brother    Hypertension Brother    Stroke Brother    Hypertension Brother    Breast cancer Neg Hx     Social History   Socioeconomic History   Marital status: Widowed    Spouse name: Not on file   Number of children: 1   Years of education: Not on file   Highest education level: Not on file  Occupational History   Not on file    Social Needs   Financial resource strain: Patient refused   Food insecurity:    Worry: Patient refused    Inability: Patient refused   Transportation needs:    Medical: Patient refused    Non-medical: Patient refused  Tobacco Use   Smoking status: Never Smoker   Smokeless tobacco: Never Used  Substance and Sexual Activity   Alcohol use: No    Alcohol/week: 0.0 standard drinks   Drug use: No   Sexual activity: Not Currently  Lifestyle   Physical activity:    Days per week: Patient refused    Minutes per session: Patient refused   Stress: Patient refused  Relationships   Social connections:    Talks  on phone: Patient refused    Gets together: Patient refused    Attends religious service: Patient refused    Active member of club or organization: Patient refused    Attends meetings of clubs or organizations: Patient refused    Relationship status: Patient refused  Other Topics Concern   Not on file  Social History Narrative   Lives with daughter, [name]     Review of Systems: A 12 point ROS discussed and pertinent positives are indicated in the HPI above.  All other systems are negative.  Review of Systems  Vital Signs: LMP 05/10/2012   Physical Exam Constitutional:      Appearance: She is ill-appearing.  HENT:     Head: Normocephalic and atraumatic.  Cardiovascular:     Rate and Rhythm: Normal rate and regular rhythm.  Pulmonary:     Effort: Pulmonary effort is normal.     Breath sounds: Normal breath sounds.  Skin:    General: Skin is warm and dry.  Neurological:     Mental Status: She is disoriented.     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 COMPARISON:  03/02/2019  FINDINGS: Survey of the abdominal 4 quadrants demonstrates only a small amount of left lower quadrant ascites. No significant large volume of abdominopelvic ascites to warrant therapeutic paracentesis. Procedure not performed. IMPRESSION: Small amount of left lower quadrant ascites. Electronically Signed   By: Jerilynn Mages.  Shick M.D.   On: 03/09/2019 11:28   US Abdomen Limited  Result Date: 02/23/2019 CLINICAL DATA:  Ascites, abdominal distension, assess for paracentesis EXAM: LIMITED ABDOMEN ULTRASOUND FOR ASCITES TECHNIQUE: Limited ultrasound survey for ascites was performed in all four abdominal quadrants. COMPARISON:  02/20/2019 FINDINGS: Very small amount of left lower quadrant ascites, not enough to warrant therapeutic paracentesis. Remainder of the abdomen demonstrates no significant ascites. Procedure not performed. IMPRESSION: Small amount of left lower quadrant ascites by ultrasound. Electronically Signed   By: Jerilynn Mages.  Shick M.D.   On: 02/23/2019 09:50   US Paracentesis  Result Date: 03/02/2019 INDICATION: 68 year old with cholangiocarcinoma and ascites. EXAM: ULTRASOUND GUIDED PARACENTESIS MEDICATIONS: None. COMPLICATIONS: None immediate. PROCEDURE: Informed written consent was obtained from the patient after a discussion of the risks, benefits and alternatives to treatment. A timeout was performed prior to the initiation of the procedure. Initial ultrasound scanning demonstrates a large amount of ascites within the left lower abdominal quadrant. The left lower abdomen was prepped and draped in the usual sterile fashion. 1% lidocaine was used for local anesthesia. Following this, a 6 Fr Safe-T-Centesis catheter was introduced. An ultrasound image was saved for documentation purposes. The paracentesis was performed. The catheter was removed and a dressing was applied. The patient tolerated the procedure well without immediate post procedural complication. FINDINGS: A total of approximately 3.1 L of brown colored  fluid was removed. IMPRESSION: Successful ultrasound-guided paracentesis yielding 3.1 liters of peritoneal fluid. Electronically Signed   By: Markus Daft M.D.   On: 03/02/2019 15:37   US Paracentesis  Result Date: 02/20/2019 INDICATION: Cholangiocarcinoma. Abdominal distention and ascites. Request for diagnostic and therapeutic paracentesis. EXAM: ULTRASOUND GUIDED LEFT LOWER QUADRANT PARACENTESIS MEDICATIONS: None. COMPLICATIONS: None immediate. PROCEDURE: Informed written consent was obtained from the patient after a discussion of the risks, benefits and alternatives to treatment. A timeout was performed prior  to the initiation of the procedure. Initial ultrasound scanning demonstrates a moderate amount of ascites within the left lower abdominal quadrant. The left lower abdomen was prepped and draped in the usual sterile fashion. 1% lidocaine with epinephrine was used for local anesthesia. Following this, a 6 Fr Safe-T-Centesis catheter was introduced. An ultrasound image was saved for documentation purposes. The paracentesis was performed. The catheter was removed and a dressing was applied. The patient tolerated the procedure well without immediate post procedural complication. FINDINGS: A total of approximately 2.8 L of clear yellow fluid was removed. Samples were sent to the laboratory as requested by the clinical team. IMPRESSION: Successful ultrasound-guided paracentesis yielding 2.8 liters of peritoneal fluid. Read by: Ascencion Dike PA-C Electronically Signed   By: Sandi Mariscal M.D.   On: 02/20/2019 11:17    Labs:  CBC: Recent Labs    02/19/19 2256 02/21/19 0323 02/23/19 0353 03/05/19 1301  WBC 8.0 6.9 6.7 9.5  HGB 10.9* 9.9* 9.7* 11.0*  HCT 35.5* 32.9* 32.6* 37.1  PLT 609* 505* 520* 543*    COAGS: Recent Labs    02/05/19 1045 02/19/19 2256  INR 1.93 1.4    BMP: Recent Labs    02/19/19 2256 02/21/19 0323 02/23/19 0353 03/05/19 1301  NA 136 139 139 136  K 4.1 3.9 3.6 4.2    CL 97* 99 100 94*  CO2 30 32 33* 30  GLUCOSE 164* 143* 144* 145*  BUN _0 CALCIUM 8.2* 8.0* 7.7* 7.9*  CREATININE 0.59 0.53 0.67 0.63  GFRNONAA >60 >60 >60 >60  GFRAA >60 >60 >60 >60    LIVER FUNCTION TESTS: Recent Labs    02/05/19 0332 02/19/19 2256 02/21/19 0323 03/05/19 1301  BILITOT 0.8 0.8 0.8 0.9  AST 35 25 28 45*  ALT _1 ALKPHOS 473* 264* 192* 629*  PROT 5.3* 6.1* 5.3* 6.4*  ALBUMIN 2.2* 2.1* 1.7* 2.0*    TUMOR MARKERS: No results for input(s): AFPTM, CEA, CA199, CHROMGRNA in the last 8760 hours.  Assessment and Plan:  Cho.langoipcarcinoma and malignant ascites. Pleurx to follow. Consent was obtained from her daughter. She understands the risks. The daughter wishes no extreme measures if the experiences respiratory or cardiac arrest during the procedure. The patient is DNR.  Thank you for this interesting consult.  I greatly enjoyed meeting Kaylee Wolf and look forward to participating in their care.  A copy of this report was sent to the requesting provider on this date.  Electronically Signed: Art A Fard Borunda, MD 03/22/2019, 9:32 AM   I spent a total of  40 Minutes   in face to face in clinical consultation, greater than 50% of which was counseling/coordinating care for Pleurx catheter placement.

## 2019-03-22 NOTE — Progress Notes (Signed)
Patient's procedure cancelled due to elevated inr 2.1 dr. Barbie Banner cancelled procedure.  Daughter notified and ems called for non emergent transport to patient's daughters home

## 2019-03-22 NOTE — Telephone Encounter (Signed)
I got a call from 1 needed in special procedures today that they cannot move forward getting in the percutaneous tube due to her INR was 2.1.  I asked Dr. Janese Banks what we need to do in order to get this fixed.  Dr. Janese Banks would like the patient to take vitamin K 5 mg daily for 5 days.  Hospice to check her PT/INR on 3/31.  Per special procedures they will put her on the schedule to get the percutaneous tube put in on April 1.  I I called hospice spoke to [facility] and they are willing to check her PT/INR on 3/31.  I then then called the daughter and spoke with her and told her that [facility] for vitamin K.  We will give her 1 tablet daily for 5 days and then hospice will come by and check her bleeding time on Tuesday, March 31.   We have [facility] for the family to pick up and start today.  I will ask for a nonemergent EMS transportation when I get confirmation of what time it is going to be on April 1 to try the percutaneous tube placement again

## 2019-03-23 ENCOUNTER — Telehealth: Payer: Self-pay

## 2019-03-23 ENCOUNTER — Other Ambulatory Visit: Payer: Self-pay

## 2019-03-23 NOTE — Telephone Encounter (Signed)
I called and left message that we are cancelling all appt at [facility] because she is in hospice. I will call ems for [facility] to get tube placed to have fluid drained at home.I did leave the message that I will call her with the plan for ems once I have it approved

## 2019-03-23 NOTE — Telephone Encounter (Signed)
Kaylee Wolf, I spoke with patient this morning and then with her daughter-Karen. [name] wanted me to ask you what she could do in reference to her mother's care since she is in bed rest. I told her that I would send you a message to contact her. Please give her a call when you have a minute. Thank you.

## 2019-03-26 ENCOUNTER — Inpatient Hospital Stay: Payer: Medicare HMO | Admitting: Hospice and Palliative Medicine

## 2019-03-26 ENCOUNTER — Telehealth: Payer: Self-pay | Admitting: *Deleted

## 2019-03-26 NOTE — Telephone Encounter (Signed)
[place] is trying to make sure everything is set up for the patient to come 4/1 9 am with EMS transport and making sure the patient's PT/INr is ok to proceed with tube placement for paracentesis drainage. She called daughter [name] to check on things and daughter tells her that the hospice nurse called to let [name] know that she will not be coming out due to coronavirus and she will do telephone call to check on needs of patient.  They were suppose to go out tom. And get PT/INR done to know that it will be safe for patient to come on wed for tube placement. I called and spoke to Hayden at hospice and then later got a call from the nurse Dominica that she will be coming out and get the blood work tom..and she would call the daughter and explain to her that things like blood work she would need to come to the house. I called Juanita back to let her know and the hospice nurse will fax the results to me tom.  Also I spoke to non-emergency transport about pt. Being taken to [facility] specials on 4/1 with an arrival at 9 am. Phone # [phone]. spoek to [facility] and everything is set up for this transport

## 2019-03-27 ENCOUNTER — Other Ambulatory Visit: Payer: Self-pay | Admitting: Student

## 2019-03-27 ENCOUNTER — Telehealth: Payer: Self-pay | Admitting: *Deleted

## 2019-03-27 NOTE — Telephone Encounter (Signed)
I called Kaylee Wolf at hospice to get PT results and they are Pt/INR 1.3 and PT-16.1. allyson in specials notified of results. I called Kaylee Wolf the daughter and told her the results also.

## 2019-03-28 ENCOUNTER — Ambulatory Visit
Admission: RE | Admit: 2019-03-28 | Discharge: 2019-03-28 | Disposition: A | Discharge status: Home / Self Care | Source: Ambulatory Visit | Attending: Oncology | Admitting: Oncology

## 2019-03-28 ENCOUNTER — Telehealth: Payer: Self-pay | Admitting: *Deleted

## 2019-03-28 ENCOUNTER — Other Ambulatory Visit: Payer: Self-pay

## 2019-03-28 DIAGNOSIS — C221 Intrahepatic bile duct carcinoma: Secondary | ICD-10-CM | POA: Diagnosis not present

## 2019-03-28 DIAGNOSIS — R180 Malignant ascites: Secondary | ICD-10-CM | POA: Insufficient documentation

## 2019-03-28 DIAGNOSIS — C787 Secondary malignant neoplasm of liver and intrahepatic bile duct: Secondary | ICD-10-CM

## 2019-03-28 LAB — CBC
HCT: 31.6 % — ABNORMAL LOW (ref 36.0–46.0)
Hemoglobin: 9.3 g/dL — ABNORMAL LOW (ref 12.0–15.0)
MCH: 24.9 pg — ABNORMAL LOW (ref 26.0–34.0)
MCHC: 29.4 g/dL — ABNORMAL LOW (ref 30.0–36.0)
MCV: 84.5 fL (ref 80.0–100.0)
Platelets: 402 10*3/uL — ABNORMAL HIGH (ref 150–400)
RBC: 3.74 MIL/uL — ABNORMAL LOW (ref 3.87–5.11)
RDW: 18.6 % — ABNORMAL HIGH (ref 11.5–15.5)
WBC: 7.6 10*3/uL (ref 4.0–10.5)
nRBC: 0 % (ref 0.0–0.2)

## 2019-03-28 LAB — GLUCOSE, CAPILLARY: Glucose-Capillary: 127 mg/dL — ABNORMAL HIGH (ref 70–99)

## 2019-03-28 MED ORDER — ONDANSETRON HCL 4 MG/2ML IJ SOLN
4.0000 mg | Freq: Once | INTRAMUSCULAR | Status: AC
  Administered 2019-03-28: 11:00:00 4 mg via INTRAVENOUS
  Filled 2019-03-28: qty 2

## 2019-03-28 MED ORDER — HEPARIN SOD (PORK) LOCK FLUSH 100 UNIT/ML IV SOLN
INTRAVENOUS | Status: AC
  Administered 2019-03-28: 500 [IU]
  Filled 2019-03-28: qty 5

## 2019-03-28 MED ORDER — CEFAZOLIN SODIUM-DEXTROSE 2-4 GM/100ML-% IV SOLN
2.0000 g | Freq: Once | INTRAVENOUS | Status: AC
  Administered 2019-03-28: 10:00:00 2 g via INTRAVENOUS

## 2019-03-28 MED ORDER — SODIUM CHLORIDE FLUSH 0.9 % IV SOLN
INTRAVENOUS | Status: AC
  Filled 2019-03-28: qty 10

## 2019-03-28 MED ORDER — SODIUM CHLORIDE 0.9 % IV SOLN
INTRAVENOUS | Status: DC
  Administered 2019-03-28: 09:00:00 via INTRAVENOUS

## 2019-03-28 NOTE — Progress Notes (Signed)
Non-emergent transport called to transport home. Due to corona-virus protocol daughter was called with discharge instructions given. 11:12 IV fluids discontinued and port flushed with Heparin as per protocol.

## 2019-03-28 NOTE — Progress Notes (Signed)
14:05 discharged home via non-emergent transport EMS. Daughter called and notified.

## 2019-03-28 NOTE — Telephone Encounter (Signed)
I called Kaylee Wolf and spoke to her. I told her that Kaylee Wolf was off today and I would ask in am. As a rule for this patient she has not had to have one any sooner that once a week.  I will call Kaylee Wolf back tom. With answer but since she just got it placed today I think tom. Will be good to get the answer. She was agreeable also.

## 2019-03-28 NOTE — Telephone Encounter (Signed)
Patient had pleur ex cath placed yesterday and nurse needs to know when she is to start draining it, how often to drain and how much to drain off at a time. Please return call to [place]

## 2019-03-28 NOTE — Discharge Instructions (Signed)
Patient information and instruction packet from the manufacturer being sent home with patient and discharge instructions called to daughter [name]).

## 2019-03-29 LAB — GLUCOSE, CAPILLARY: Glucose-Capillary: 42 mg/dL — CL (ref 70–99)

## 2019-03-29 NOTE — Telephone Encounter (Signed)
I called Sydney at hospice back and told her that Dr. Janese Banks states that she can be drained 1 time a week and no limit as to how much can be drained as long as patient is tolerating it. Also she can have prn order if patient becomes symptomatic.  Jodi Mourning was fine with the order

## 2019-04-09 ENCOUNTER — Other Ambulatory Visit: Payer: Self-pay | Admitting: *Deleted

## 2019-04-09 MED ORDER — MORPHINE SULFATE (CONCENTRATE) 20 MG/ML PO SOLN: ORAL | 0 refills | Status: AC

## 2019-04-09 NOTE — Telephone Encounter (Signed)
Patient using Roxanol in comfort kit and is almost out. Needs refill

## 2019-04-09 NOTE — Telephone Encounter (Signed)
It appears that patient has been on oxycodone. Does she need a refill of that before rotating to morphine elixir?

## 2019-04-11 ENCOUNTER — Telehealth: Payer: Self-pay | Admitting: *Deleted

## 2019-04-11 MED ORDER — CIPROFLOXACIN HCL 0.3 % OP SOLN: OPHTHALMIC | 0 refills | Status: AC

## 2019-04-11 NOTE — Telephone Encounter (Signed)
Cipro prescription sent to [facility] per Hospice nurse request and hospice nurse informed

## 2019-04-11 NOTE — Telephone Encounter (Signed)
If they thihthis looks infected-  they should provide cipro eye drops. 1 to 2 drops q2 hours for 1st 2 days then Q4 hours next 5 days when patient is awake

## 2019-04-11 NOTE — Telephone Encounter (Signed)
Hospice nurse called reporting that patient has mucous and drainage as well as pain of the eye and they do not have anything on their protocol for it. Please advise

## 2019-04-16 ENCOUNTER — Telehealth: Payer: Self-pay | Admitting: *Deleted

## 2019-04-16 NOTE — Telephone Encounter (Signed)
Hospice nurse, Casimer Bilis, called requesting a call back [phone].  Casimer Bilis reports that pt is vomiting bile, medication is not controlling and plurex is not draining.

## 2019-04-27 DEATH — deceased

## 2019-12-25 IMAGING — MR MR ABDOMEN WO/W CM
16 of 17 series · 44 of 48 positions shown · IV contrast (Gadavist)
Comparison: No prior abdominal MRI. CT the chest, abdomen and
pelvis 02/01/2019.

CLINICAL DATA: 67-year-old female with history of abnormal CT scan
concerning for potential cholangiocarcinoma. Follow-up study.

EXAM:
MRI ABDOMEN WITHOUT AND WITH CONTRAST
TECHNIQUE: Multiplanar multisequence MR imaging of the abdomen was performed
both before and after the administration of intravenous contrast.
CONTRAST:  8 mL of Gadavist.

[Series 2: T2 · coronal · 6.0mm · 1.19mm/px · 2 of 40 slices shown (1 of 2)]
[im 1/40]
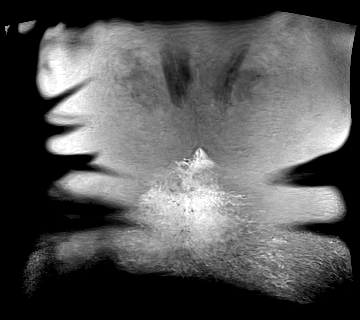
[im 40/40]
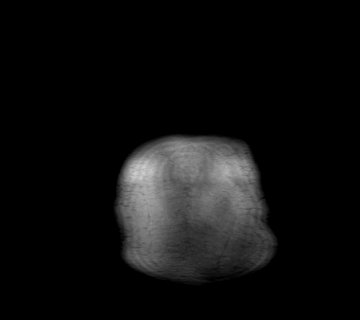

[Series 3: T2 · axial · 6.0mm · 1.25mm/px · z∈[-36,+245]mm · 2 of 40 slices shown (2 of 2)]
[im 1/40]
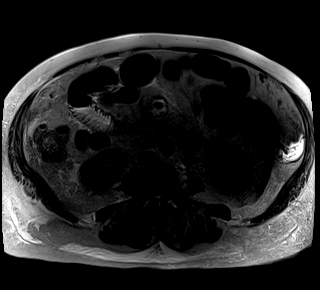
[im 40/40]
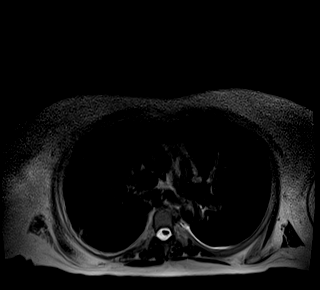

[Series 5: T2 fat-sat · axial · 6.0mm · 1.25mm/px · z∈[-33,+240]mm · 2 of 39 slices shown]
[im 1/39]
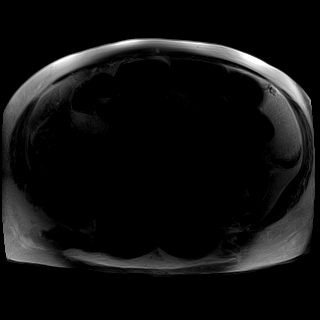
[im 39/39]
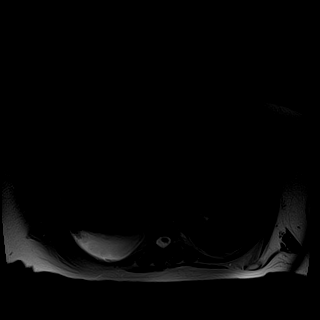

[Series 6: ax dwi_tracew · axial · 6.0mm · 1.42mm/px · z∈[-36,+245]mm · 5 of 120 slices shown]
[im 1/120]
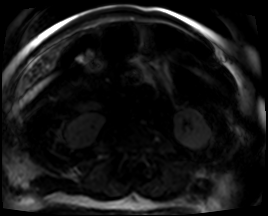
[im 30/120]
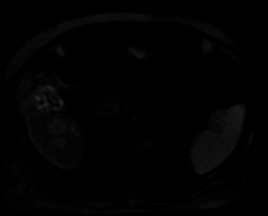
[im 60/120]
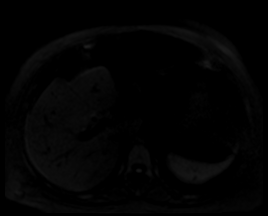
[im 90/120]
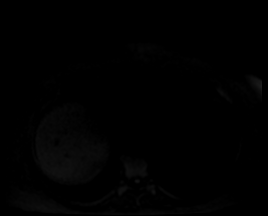
[im 120/120]
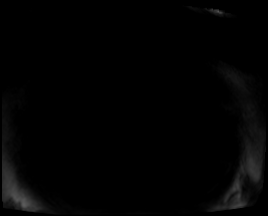

[Series 7: ax dwi_adc · axial · 6.0mm · 1.42mm/px · z∈[-36,+245]mm · 2 of 40 slices shown]
[im 1/40]
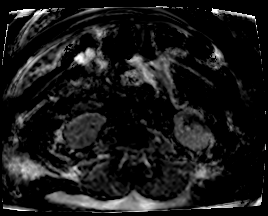
[im 40/40]
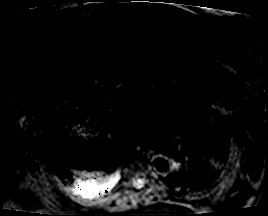

[Series 8: T1 · axial · 6.0mm · 0.74mm/px · z∈[-36,+245]mm · 3 of 80 slices shown]
[im 1/80]
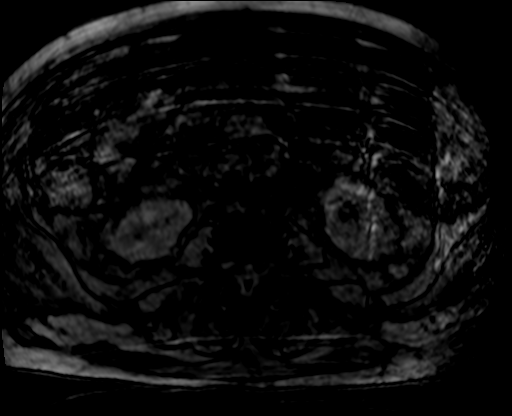
[im 40/80]
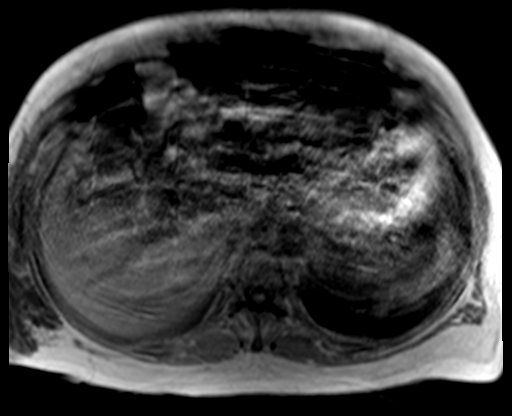
[im 80/80]
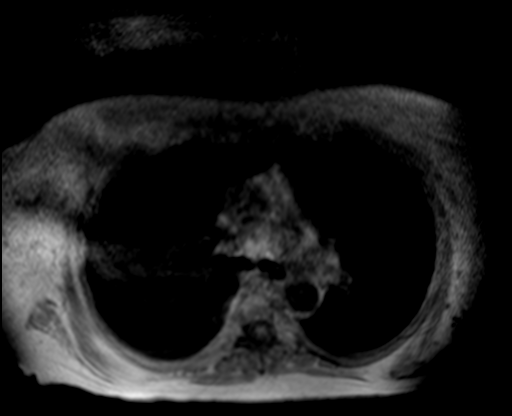

[Series 9: bSSFP · axial · 6.0mm · 0.74mm/px · z∈[-36,+245]mm · 2 of 40 slices shown]
[im 1/40]
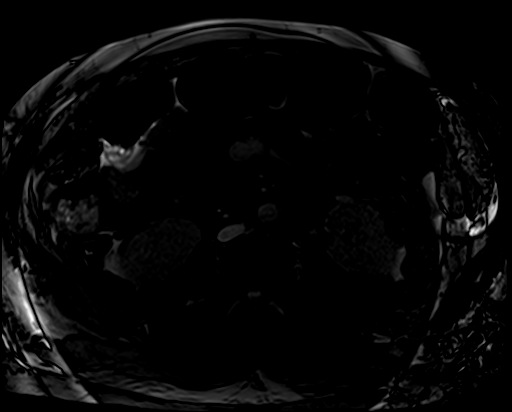
[im 40/40]
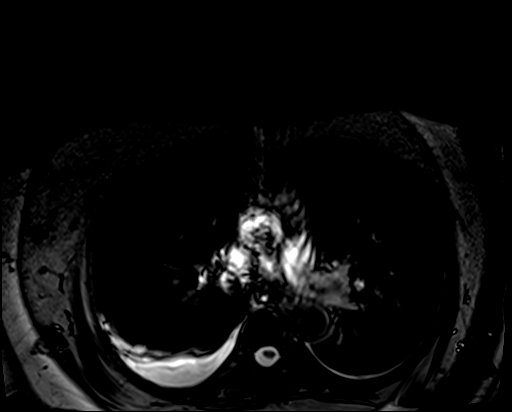

[Series 10: T1 dynamic fat-sat · axial · non-contrast · 3.0mm · 1.19mm/px · z∈[-14,+223]mm · 3 of 80 slices shown (1 of 4)]
[im 1/80]
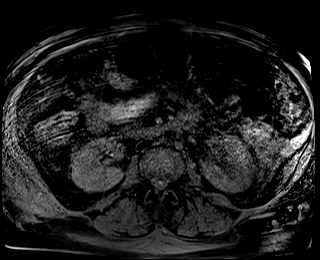
[im 40/80]
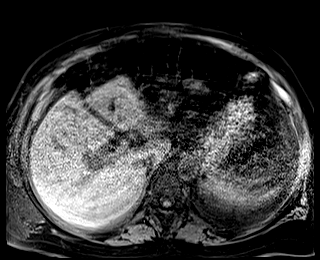
[im 80/80]
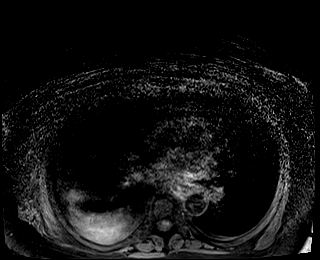

[Series 11: T1 dynamic fat-sat post-contrast · axial · 3.0mm · 1.19mm/px · z∈[-14,+223]mm · 3 of 80 slices shown (1 of 4)]
[im 1/80]
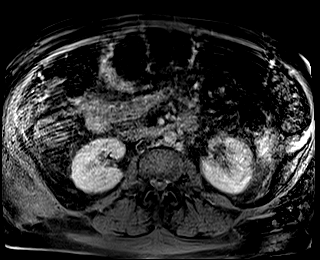
[im 40/80]
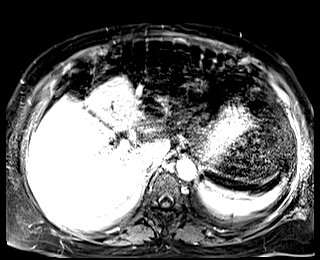
[im 80/80]
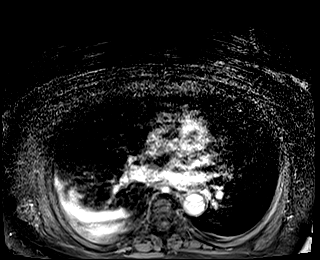

[Series 12: T1 dynamic fat-sat · axial · 3.0mm · 1.19mm/px · z∈[-14,+223]mm · 3 of 80 slices shown (2 of 4)]
[im 1/80]
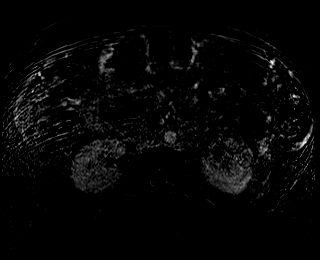
[im 40/80]
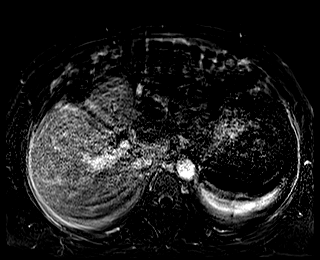
[im 80/80]
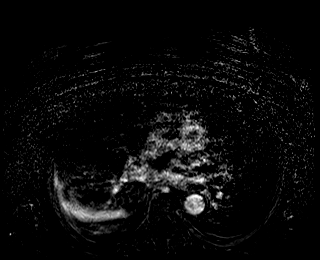

[Series 13: T1 dynamic fat-sat post-contrast · axial · 3.0mm · 1.19mm/px · z∈[-14,+223]mm · 3 of 80 slices shown (2 of 4)]
[im 1/80]
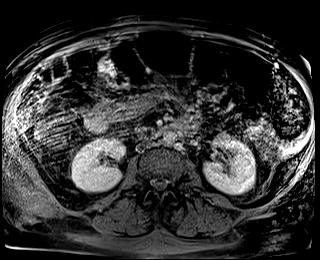
[im 40/80]
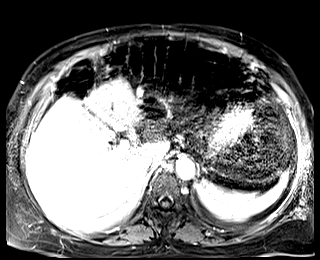
[im 80/80]
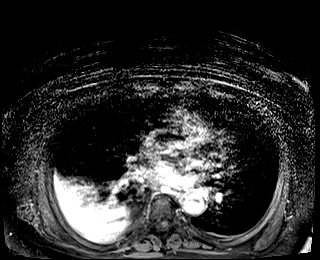

[Series 14: T1 dynamic fat-sat · axial · 3.0mm · 1.19mm/px · z∈[-14,+223]mm · 3 of 80 slices shown (3 of 4)]
[im 1/80]
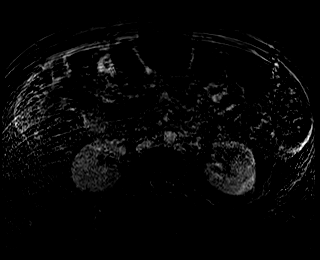
[im 40/80]
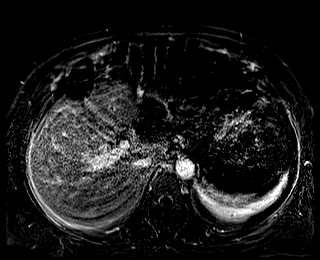
[im 80/80]
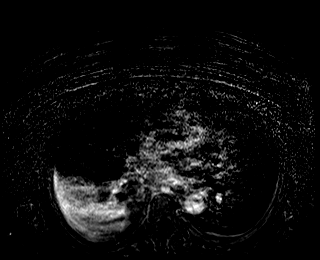

[Series 15: T1 dynamic fat-sat post-contrast · axial · 3.0mm · 1.19mm/px · z∈[-14,+223]mm · 3 of 80 slices shown (3 of 4)]
[im 1/80]
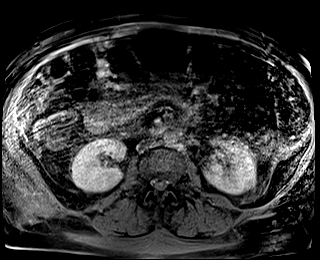
[im 40/80]
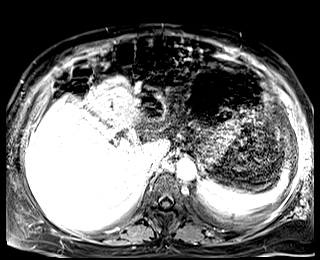
[im 80/80]
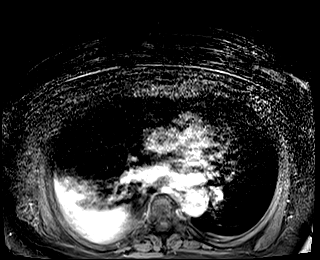

[Series 16: T1 dynamic fat-sat · axial · 3.0mm · 1.19mm/px · z∈[-14,+223]mm · 3 of 80 slices shown (4 of 4)]
[im 1/80]
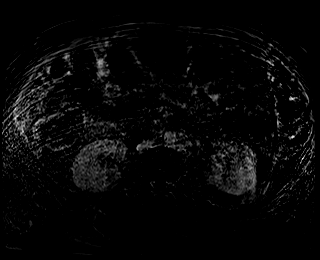
[im 40/80]
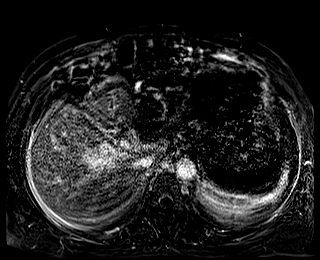
[im 80/80]
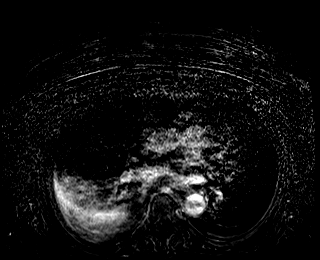

[Series 17: T1 dynamic post-contrast · coronal · 3.0mm · 1.31mm/px · 3 of 72 slices shown]
[im 1/72]
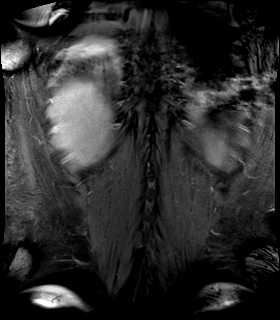
[im 36/72]
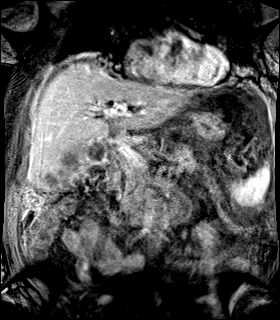
[im 72/72]
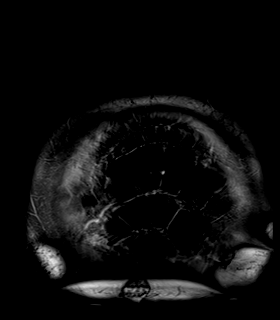

[Series 18: T1 dynamic fat-sat post-contrast · axial · 3.0mm · 1.19mm/px · z∈[-14,+103]mm · 2 of 80 slices shown (4 of 4)]
[im 1/80]
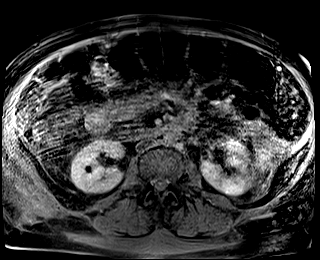
[im 40/80]
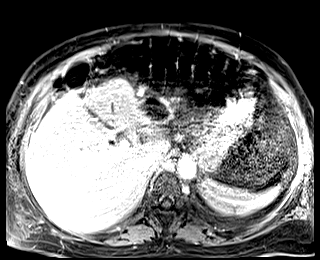

[44 of 48 positions shown; findings below may reference images not displayed]

FINDINGS: Comment: Today's study is limited by considerable patient
respiratory motion.

Lower chest: T2 signal intensity dependently in the lower right
hemithorax, compatible with a small to moderate right pleural
effusion, likely with some associated passive atelectasis in the
right lower lobe.

Hepatobiliary: There are multiple hepatic lesions predominantly
throughout the right lobe of the liver. The largest of these is in
segment 6 (axial image 26 of series 5) measuring 2.4 x 3.7 cm. These
lesions are all T1 hypointense, heterogeneously T2 hyperintense,
demonstrate diffusion restriction, and are hypovascular with
peripheral enhancement on post gadolinium imaging, highly concerning
for metastatic lesions. MRCP imaging was not performed. However, the
previously noted intrahepatic biliary ductal dilatation evident on
CT examination 02/01/2019 is no longer noted on today's study. Mild
T2 hyperintensity in the periportal aspects of the hepatic
parenchyma, suggesting periportal edema. There is a subtle signal
void in the common bile duct, likely reflective of an indwelling
common bile duct stent. Some dependent T2 hypointensity and multiple
well-defined rounded T2 hypo intensities are noted within the
gallbladder, compatible with a combination of biliary sludge and
gallstones. Gallbladder does not appear distended. Gallbladder wall
does not appear thickened.

Pancreas: No pancreatic mass. No pancreatic ductal dilatation. No
pancreatic or peripancreatic fluid or inflammatory changes.

Spleen: Well-defined T1 hypointense, T2 hyperintense, nonenhancing
lesion in the anterior aspect of the spleen, corresponding to rim
calcified structure on recent CT examination, likely sequela of
remote splenic trauma. Spleen is otherwise unremarkable in
appearance.

Adrenals/Urinary Tract: Bilateral kidneys and adrenal glands are
normal in appearance. No hydroureteronephrosis in the visualized
portions of the abdomen.

Stomach/Bowel: Visualized portions are unremarkable.

Vascular/Lymphatic: No aneurysm identified in the visualized
abdominal vasculature. Mildly enlarged portacaval lymph node
measuring up to 1.6 cm in short axis (axial image forty-nine of
series 13).

Other:  Small volume of ascites most evident inferior to the liver.

Musculoskeletal: No aggressive appearing osseous lesions are noted
in the visualized portions of the skeleton.
IMPRESSION: 1. Multiple malignant appearing lesions throughout the right lobe of
the liver, most likely to reflect metastatic lesions. This is
associated with some portacaval lymphadenopathy.
2. Previously noted intrahepatic biliary ductal dilatation has
resolved following placement of common bile duct stent and
sphincterotomy.
3. Cholelithiasis and biliary sludge in the gallbladder. No findings
to suggest an acute cholecystitis at this time.
4. Trace volume of ascites.

## 2020-01-11 IMAGING — US US ABDOMEN LIMITED
1 series · 4 of 4 positions shown · non-contrast
Comparison: 02/20/2019

CLINICAL DATA: Ascites, abdominal distension, assess for
paracentesis

EXAM:
LIMITED ABDOMEN ULTRASOUND FOR ASCITES
TECHNIQUE: Limited ultrasound survey for ascites was performed in all four
abdominal quadrants.

[Series 1: us abdomen limited · 4 of 4 slices shown]
[im 1/4]
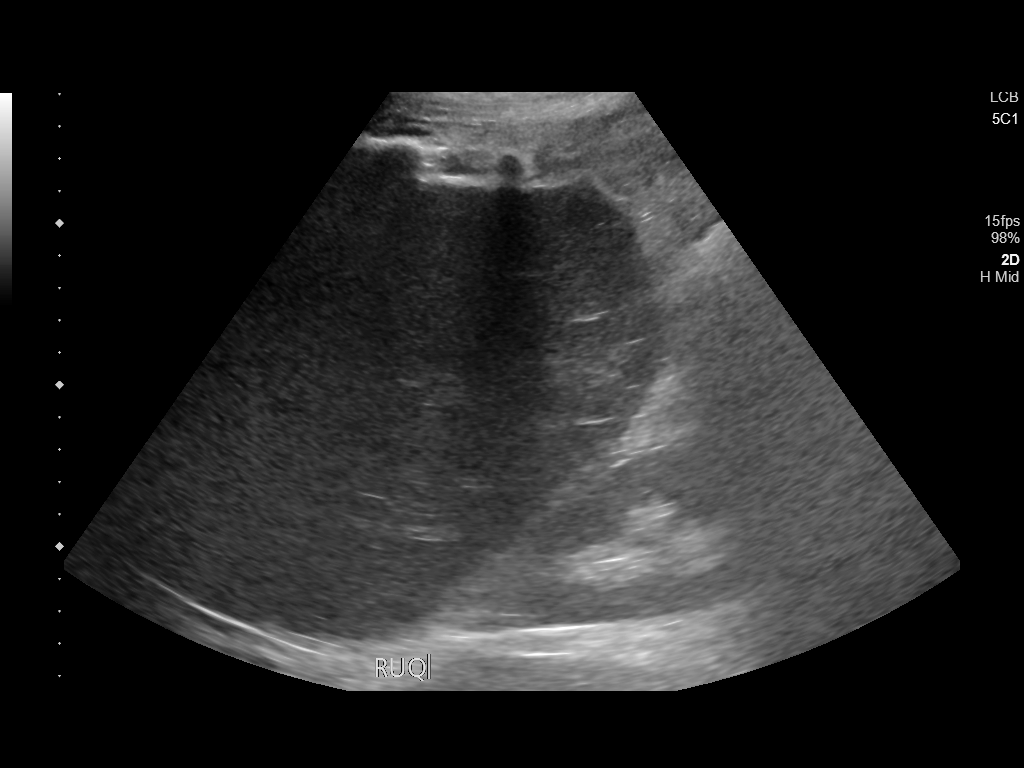
[im 2/4]
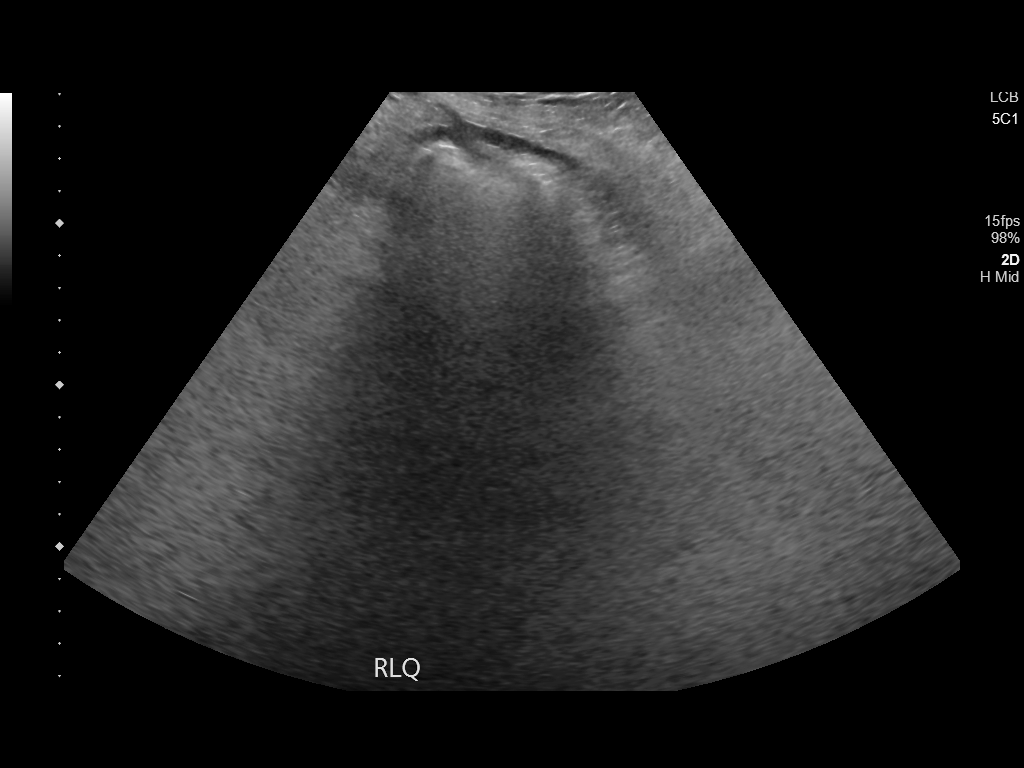
[im 3/4]
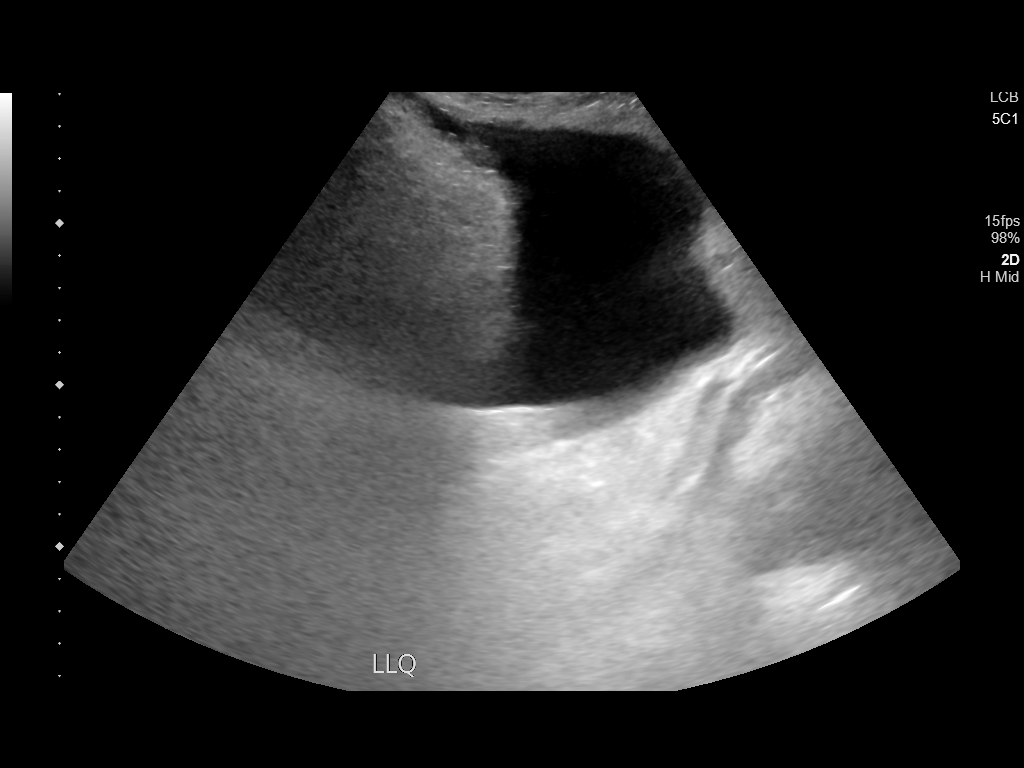
[im 4/4]
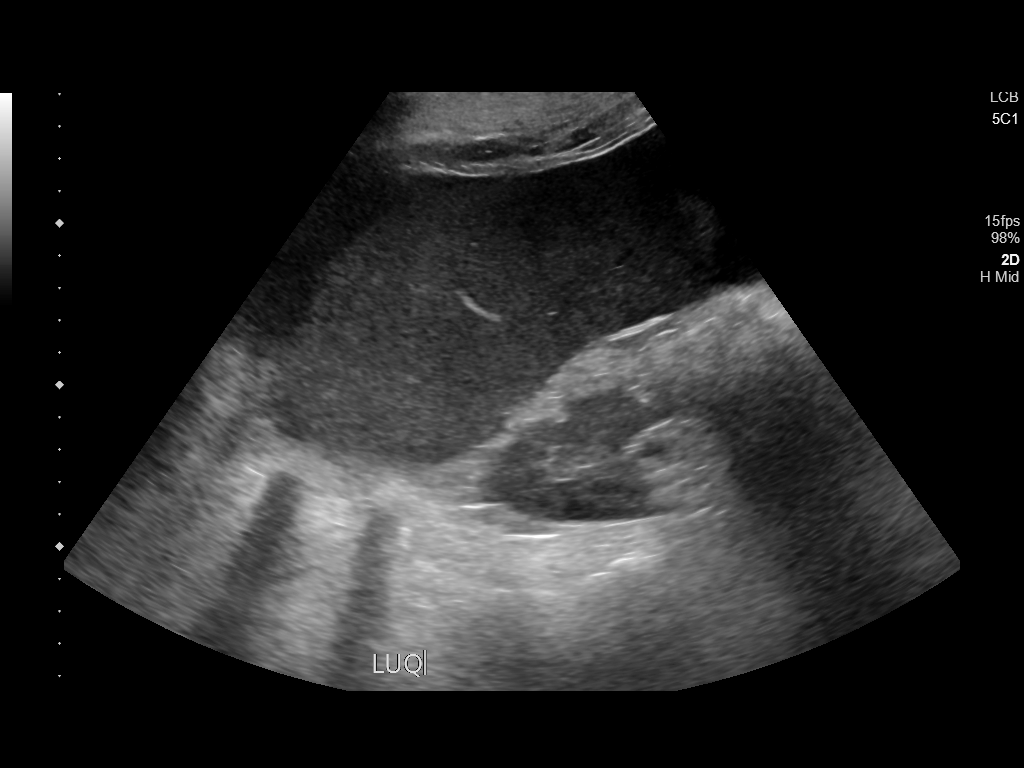

[4 of 4 positions shown; findings below may reference images not displayed]

FINDINGS: Very small amount of left lower quadrant ascites, not enough to
warrant therapeutic paracentesis. Remainder of the abdomen
demonstrates no significant ascites. Procedure not performed.
IMPRESSION: Small amount of left lower quadrant ascites by ultrasound.
